# Patient Record
Sex: Female | Born: 1975
Health system: Southern US, Community
[De-identification: ages and names within clinical notes are randomized; demographics above are authoritative.]

## PROBLEM LIST (undated history)

## (undated) DIAGNOSIS — E669 Obesity, unspecified: Secondary | ICD-10-CM

## (undated) DIAGNOSIS — G5712 Meralgia paresthetica, left lower limb: Secondary | ICD-10-CM

## (undated) DIAGNOSIS — K219 Gastro-esophageal reflux disease without esophagitis: Secondary | ICD-10-CM

## (undated) DIAGNOSIS — K589 Irritable bowel syndrome without diarrhea: Secondary | ICD-10-CM

## (undated) DIAGNOSIS — M797 Fibromyalgia: Secondary | ICD-10-CM

## (undated) DIAGNOSIS — D6861 Antiphospholipid syndrome: Secondary | ICD-10-CM

## (undated) DIAGNOSIS — E559 Vitamin D deficiency, unspecified: Secondary | ICD-10-CM

## (undated) DIAGNOSIS — R011 Cardiac murmur, unspecified: Secondary | ICD-10-CM

## (undated) DIAGNOSIS — F329 Major depressive disorder, single episode, unspecified: Secondary | ICD-10-CM

## (undated) DIAGNOSIS — M1990 Unspecified osteoarthritis, unspecified site: Secondary | ICD-10-CM

## (undated) DIAGNOSIS — I10 Essential (primary) hypertension: Secondary | ICD-10-CM

## (undated) DIAGNOSIS — F419 Anxiety disorder, unspecified: Secondary | ICD-10-CM

## (undated) DIAGNOSIS — R5383 Other fatigue: Secondary | ICD-10-CM

## (undated) DIAGNOSIS — R0602 Shortness of breath: Secondary | ICD-10-CM

## (undated) DIAGNOSIS — F32A Depression, unspecified: Secondary | ICD-10-CM

## (undated) DIAGNOSIS — G4730 Sleep apnea, unspecified: Secondary | ICD-10-CM

## (undated) DIAGNOSIS — I2699 Other pulmonary embolism without acute cor pulmonale: Secondary | ICD-10-CM

## (undated) DIAGNOSIS — N809 Endometriosis, unspecified: Secondary | ICD-10-CM

## (undated) HISTORY — DX: Fibromyalgia: M79.7

## (undated) HISTORY — DX: Gastro-esophageal reflux disease without esophagitis: K21.9

## (undated) HISTORY — DX: Major depressive disorder, single episode, unspecified: F32.9

## (undated) HISTORY — DX: Meralgia paresthetica, left lower limb: G57.12

## (undated) HISTORY — DX: Depression, unspecified: F32.A

## (undated) HISTORY — DX: Unspecified osteoarthritis, unspecified site: M19.90

## (undated) HISTORY — DX: Essential (primary) hypertension: I10

## (undated) HISTORY — DX: Vitamin D deficiency, unspecified: E55.9

## (undated) HISTORY — DX: Endometriosis, unspecified: N80.9

## (undated) HISTORY — DX: Cardiac murmur, unspecified: R01.1

## (undated) HISTORY — DX: Other fatigue: R53.83

## (undated) HISTORY — DX: Sleep apnea, unspecified: G47.30

## (undated) HISTORY — DX: Antiphospholipid syndrome: D68.61

## (undated) HISTORY — DX: Shortness of breath: R06.02

## (undated) HISTORY — DX: Anxiety disorder, unspecified: F41.9

## (undated) HISTORY — DX: Obesity, unspecified: E66.9

## (undated) HISTORY — DX: Other pulmonary embolism without acute cor pulmonale: I26.99

## (undated) HISTORY — PX: OTHER SURGICAL HISTORY: SHX169

## (undated) HISTORY — DX: Irritable bowel syndrome, unspecified: K58.9

---

## 1998-07-30 ENCOUNTER — Other Ambulatory Visit: Admission: RE | Admit: 1998-07-30 | Discharge: 1998-07-30 | Payer: Self-pay | Admitting: *Deleted

## 1999-10-27 ENCOUNTER — Emergency Department (HOSPITAL_COMMUNITY): Admission: EM | Admit: 1999-10-27 | Discharge: 1999-10-27 | Payer: Self-pay | Admitting: Emergency Medicine

## 1999-10-27 ENCOUNTER — Encounter: Payer: Self-pay | Admitting: Emergency Medicine

## 1999-11-12 ENCOUNTER — Other Ambulatory Visit: Admission: RE | Admit: 1999-11-12 | Discharge: 1999-11-12 | Payer: Self-pay | Admitting: *Deleted

## 1999-12-11 ENCOUNTER — Emergency Department (HOSPITAL_COMMUNITY): Admission: EM | Admit: 1999-12-11 | Discharge: 1999-12-11 | Payer: Self-pay | Admitting: Emergency Medicine

## 1999-12-12 ENCOUNTER — Encounter: Payer: Self-pay | Admitting: Emergency Medicine

## 2001-06-14 ENCOUNTER — Ambulatory Visit (HOSPITAL_COMMUNITY): Admission: RE | Admit: 2001-06-14 | Discharge: 2001-06-14 | Payer: Self-pay | Admitting: *Deleted

## 2001-06-14 ENCOUNTER — Encounter (INDEPENDENT_AMBULATORY_CARE_PROVIDER_SITE_OTHER): Payer: Self-pay | Admitting: *Deleted

## 2002-01-02 ENCOUNTER — Other Ambulatory Visit: Admission: RE | Admit: 2002-01-02 | Discharge: 2002-01-02 | Payer: Self-pay | Admitting: Obstetrics and Gynecology

## 2002-03-30 DIAGNOSIS — I2699 Other pulmonary embolism without acute cor pulmonale: Secondary | ICD-10-CM

## 2002-03-30 HISTORY — DX: Other pulmonary embolism without acute cor pulmonale: I26.99

## 2002-05-08 ENCOUNTER — Ambulatory Visit (HOSPITAL_COMMUNITY): Admission: RE | Admit: 2002-05-08 | Discharge: 2002-05-08 | Payer: Self-pay | Admitting: Obstetrics & Gynecology

## 2002-05-08 ENCOUNTER — Encounter (INDEPENDENT_AMBULATORY_CARE_PROVIDER_SITE_OTHER): Payer: Self-pay | Admitting: Specialist

## 2002-08-23 ENCOUNTER — Other Ambulatory Visit: Admission: RE | Admit: 2002-08-23 | Discharge: 2002-08-23 | Payer: Self-pay | Admitting: Obstetrics & Gynecology

## 2002-12-15 ENCOUNTER — Ambulatory Visit (HOSPITAL_COMMUNITY): Admission: RE | Admit: 2002-12-15 | Discharge: 2002-12-15 | Payer: Self-pay | Admitting: Obstetrics & Gynecology

## 2003-03-15 ENCOUNTER — Inpatient Hospital Stay (HOSPITAL_COMMUNITY): Admission: AD | Admit: 2003-03-15 | Discharge: 2003-03-18 | Payer: Self-pay | Admitting: Obstetrics & Gynecology

## 2003-03-20 ENCOUNTER — Encounter: Admission: RE | Admit: 2003-03-20 | Discharge: 2003-04-19 | Payer: Self-pay | Admitting: Obstetrics & Gynecology

## 2003-04-20 ENCOUNTER — Encounter: Admission: RE | Admit: 2003-04-20 | Discharge: 2003-05-20 | Payer: Self-pay | Admitting: Obstetrics & Gynecology

## 2003-04-25 ENCOUNTER — Other Ambulatory Visit: Admission: RE | Admit: 2003-04-25 | Discharge: 2003-04-25 | Payer: Self-pay | Admitting: Obstetrics & Gynecology

## 2004-03-09 ENCOUNTER — Ambulatory Visit: Payer: Self-pay | Admitting: Internal Medicine

## 2004-03-09 ENCOUNTER — Inpatient Hospital Stay (HOSPITAL_COMMUNITY): Admission: EM | Admit: 2004-03-09 | Discharge: 2004-03-12 | Payer: Self-pay | Admitting: Family Medicine

## 2004-03-09 IMAGING — CR DG CHEST 2V
2 series · 2 of 2 positions shown · non-contrast
Comparison: None.

CLINICAL DATA: Right-sided chest pain. Shortness of breath.

CHEST - 2 VIEW  [DATE]:

[view not recorded (1 of 2)]
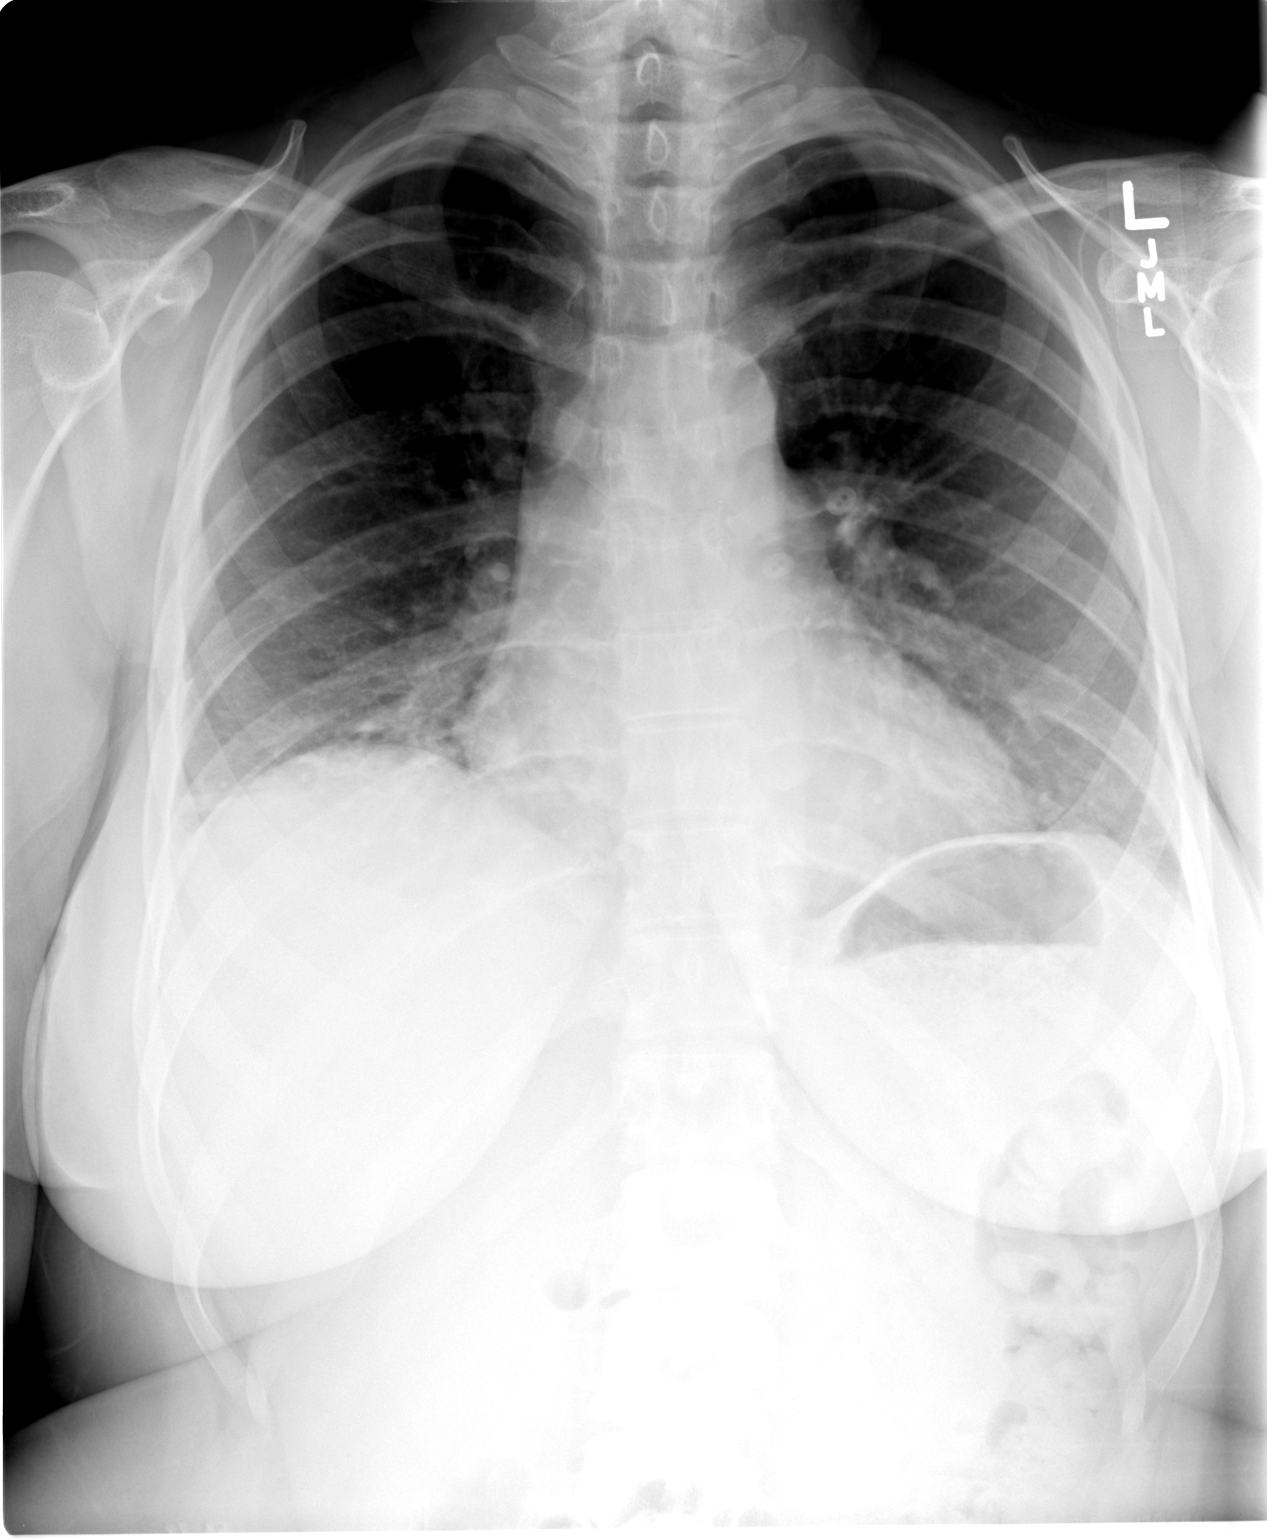

[view not recorded (2 of 2)]
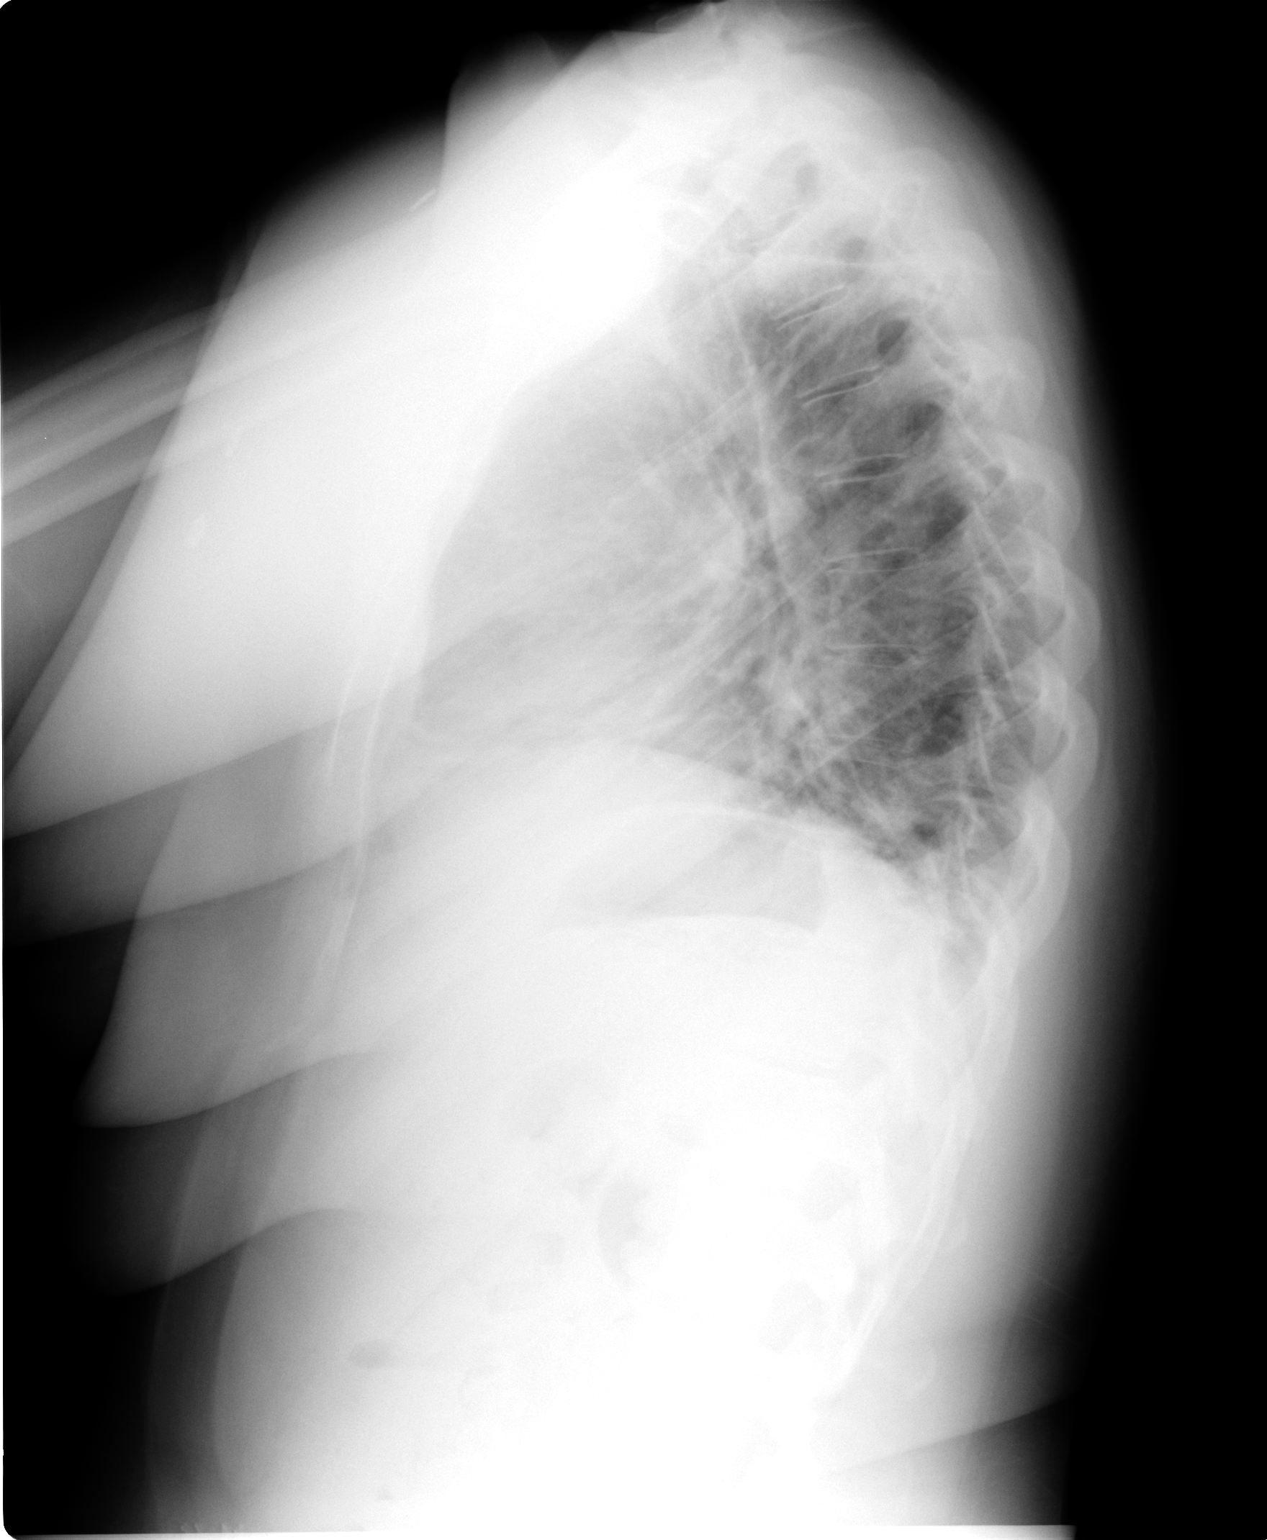

[2 of 2 positions shown; findings below may reference images not displayed]

FINDINGS: The cardiomediastinal silhouette is unremarkable. There is patchy
airspace disease in the right lower lobe. The lungs appear clear otherwise. The
visualized bony thorax appears intact.
IMPRESSION: Right lower lobe pneumonia.

## 2004-03-09 IMAGING — CT CT ANGIO CHEST
1 of 5 series · 14 of 29 positions shown · IV contrast (120 ML OMNI 300)
Comparison: none

CLINICAL DATA: Right-sided chest pain.  Short of breath.  Evaluate for pulmonary embolism.  
 CHEST CT ANGIO WITH CONTRAST:

[Series 4: recon 2: pe · axial · 0.65mm/px · z∈[-312,-58]mm · 14 of 469 slices shown]
[im 32/469  lung]
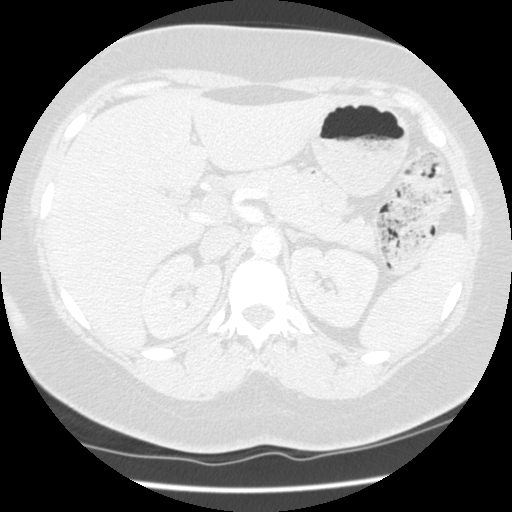
[im 63/469  mediastinal]
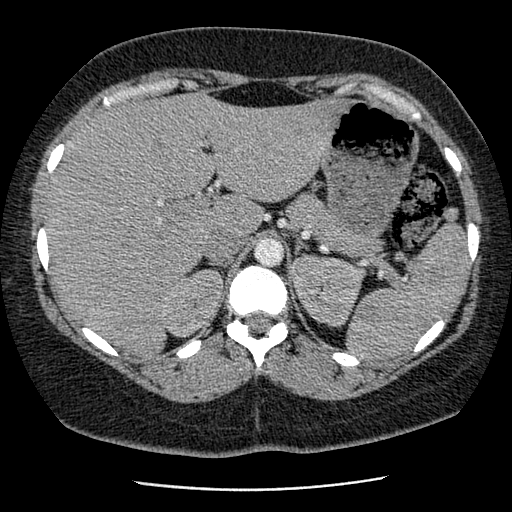
[im 94/469  lung]
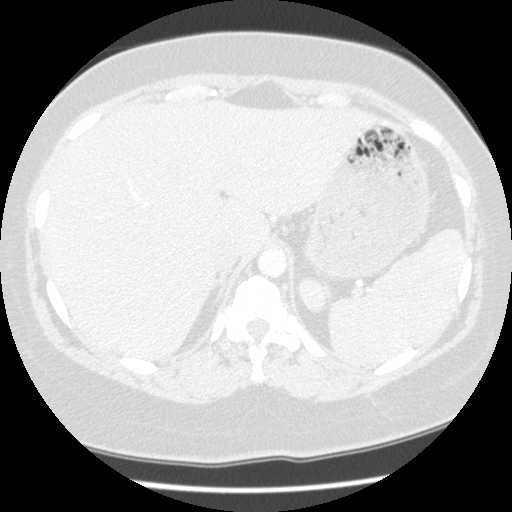
[im 125/469  mediastinal]
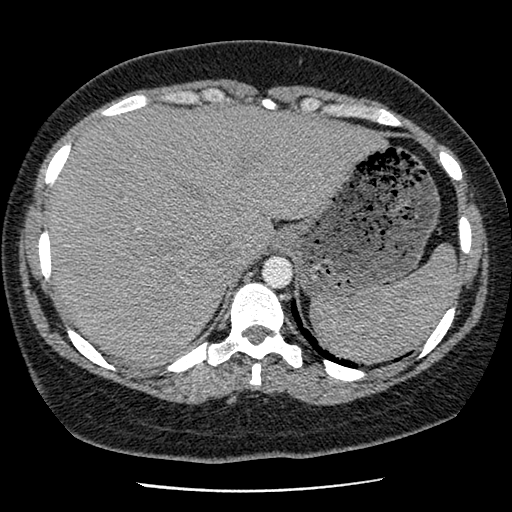
[im 157/469  lung]
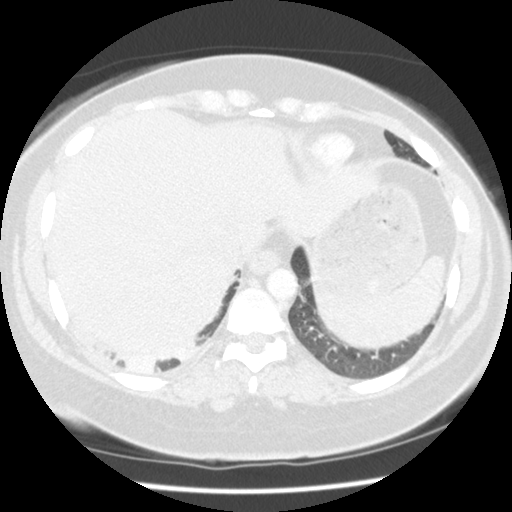
[im 188/469  mediastinal]
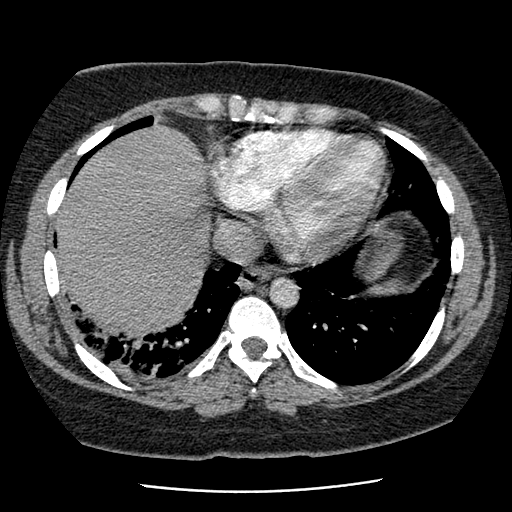
[im 219/469  lung]
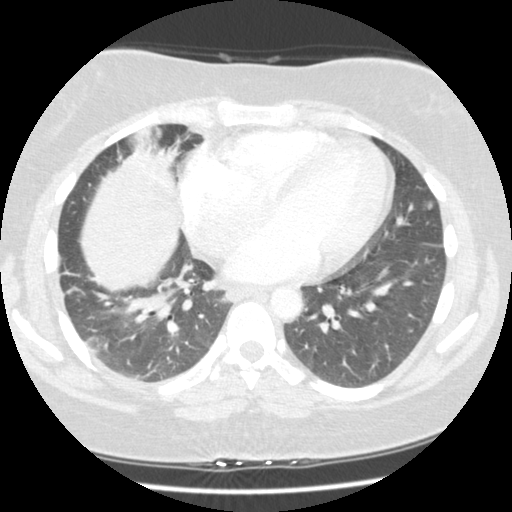
[im 250/469  mediastinal]
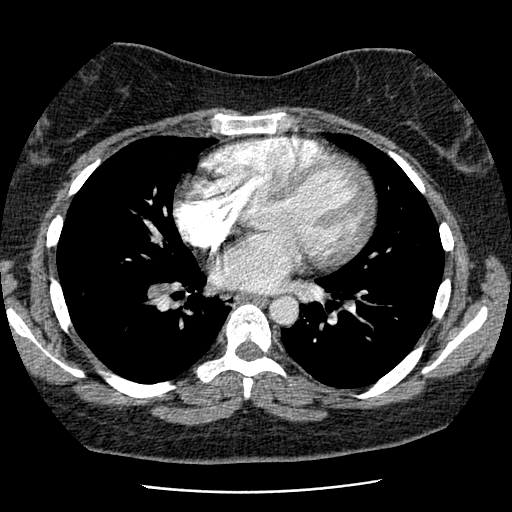
[im 281/469  lung]
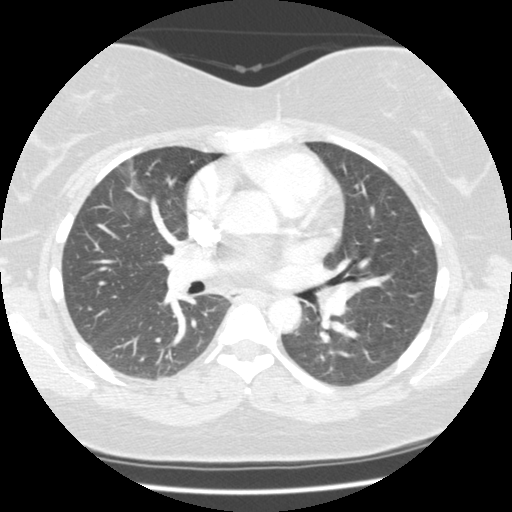
[im 313/469  mediastinal]
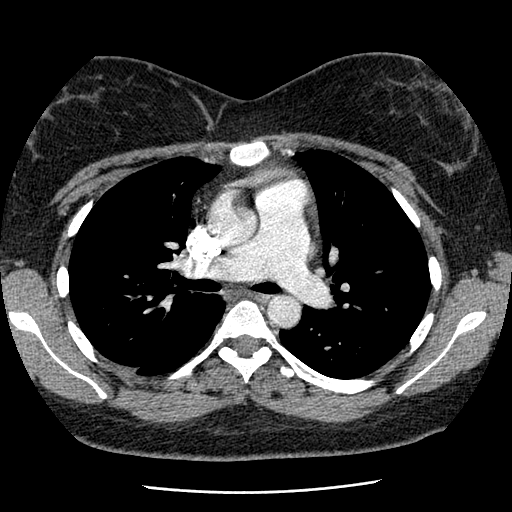
[im 344/469  lung]
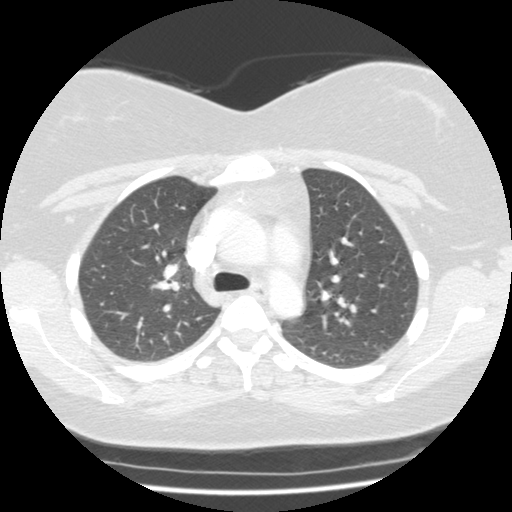
[im 375/469  mediastinal]
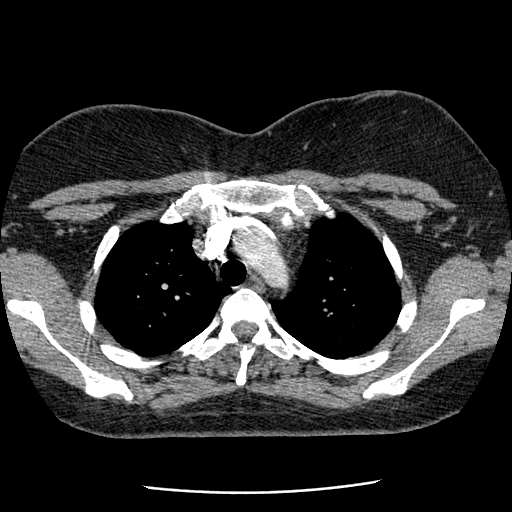
[im 406/469  lung]
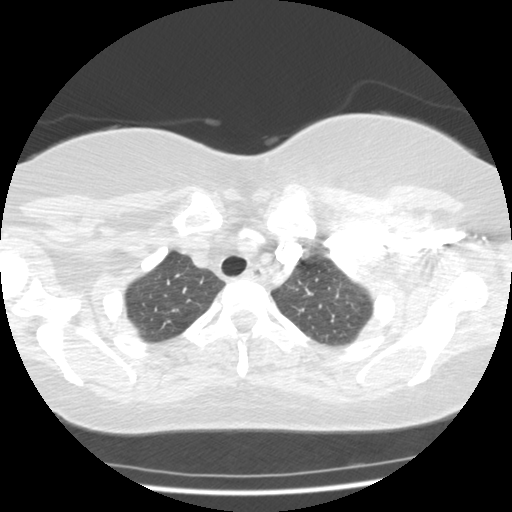
[im 437/469  mediastinal]
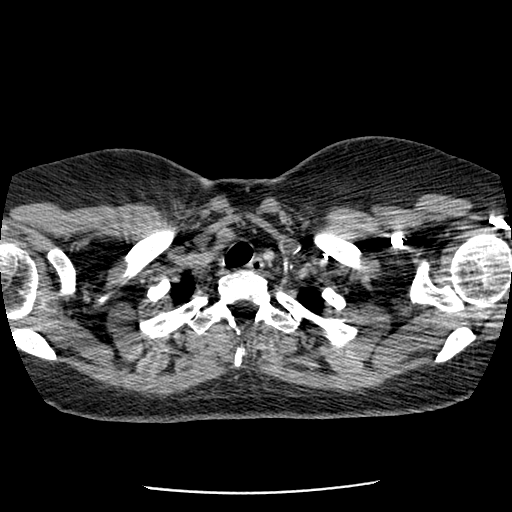

[14 of 29 positions shown; findings below may reference images not displayed]

Multidetector CT imaging of the chest was performed according to the protocol for detection of pulmonary embolism during IV bolus injection of 120 cc Omnipaque 300.  Coronal and sagittal plane reformatted images were also generated.  
 There are bilateral pulmonary emboli involving the right upper lobe, right lower lobe and left lower lobe.  There is bibasilar atelectasis and right middle lobe atelectasis.  
 There is a 5 mm nodule in the right middle lobe which is noncalcified.  There is a 3 mm right upper lobe nodule on image 41 and there is a 6 mm noncalcified nodule in the lingula on image 64.  There is no effusion.
IMPRESSION: 1. Positive for bilateral pulmonary emboli. 
 2. Bilateral atelectasis.  
 3. Bilateral noncalcified pulmonary nodules which are nonspecific.  Given the patient?s age, these are likely benign however, it could be due to metastatic disease.  Clinical correlation is suggested if there is no other history of malignancy, a followup CT is suggested in three months to assure stability.

## 2004-03-14 ENCOUNTER — Ambulatory Visit: Payer: Self-pay | Admitting: Internal Medicine

## 2004-03-20 ENCOUNTER — Ambulatory Visit: Payer: Self-pay | Admitting: Internal Medicine

## 2004-03-21 ENCOUNTER — Emergency Department (HOSPITAL_COMMUNITY): Admission: EM | Admit: 2004-03-21 | Discharge: 2004-03-22 | Payer: Self-pay | Admitting: Emergency Medicine

## 2004-03-22 IMAGING — CT CT ANGIO CHEST
3 of 6 series · 17 of 30 positions shown · IV contrast (120 ml [ID])
Comparison: [DATE].

CLINICAL DATA: Pulmonary embolus with recurrent right chest pain.  

CHEST CT ANGIO WITH CONTRAST:
Multidetector CT imaging of the chest was performed according to the protocol for detection of pulmonary embolism during IV bolus injection of 120 cc Omnipaque 300.  Coronal and sagittal plane reformatted images were also generated.

[Series 3: angio chest · axial · 0.70mm/px · z∈[-257,-45]mm · 11 of 290 slices shown]
[im 27/290  lung]
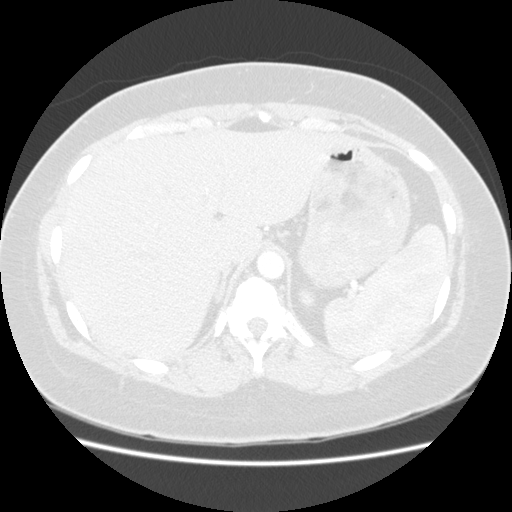
[im 53/290  mediastinal]
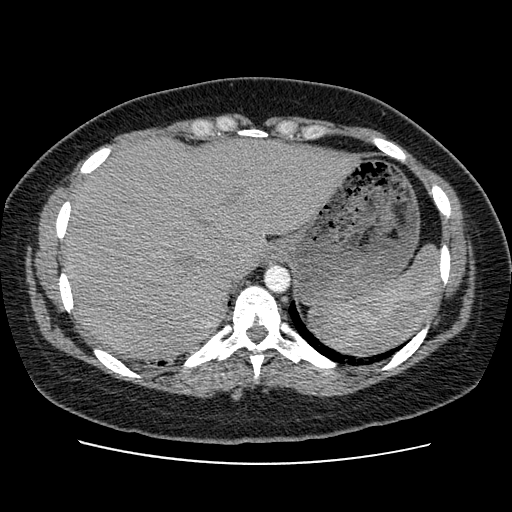
[im 79/290  lung]
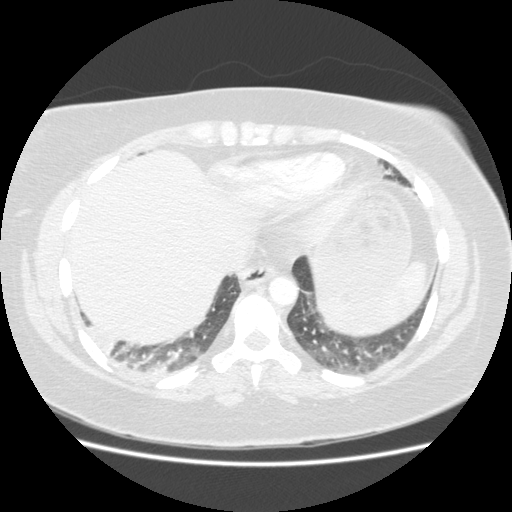
[im 106/290  mediastinal]
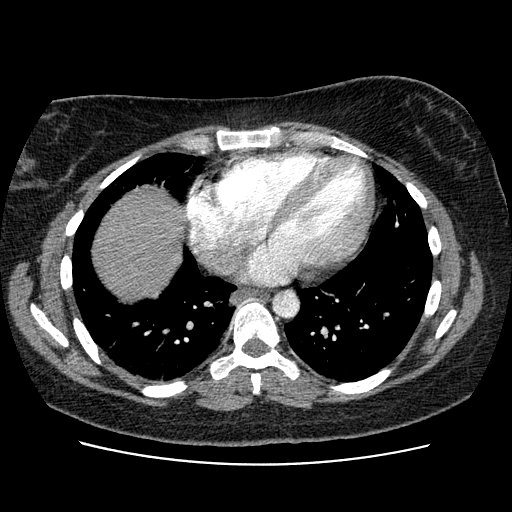
[im 132/290  lung]
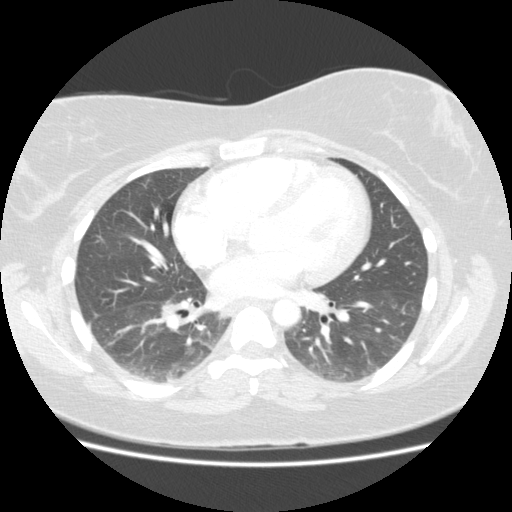
[im 158/290  mediastinal]
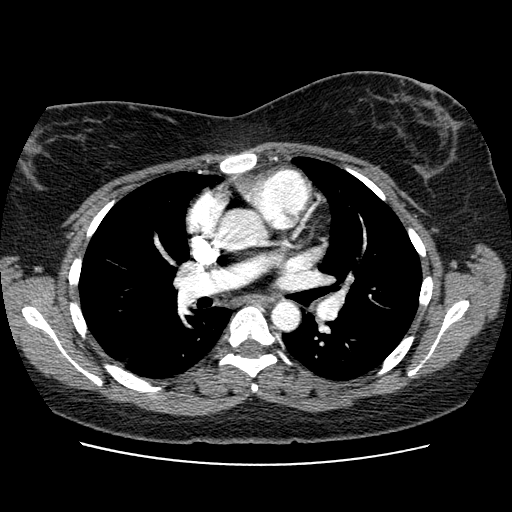
[im 162/290  lung]
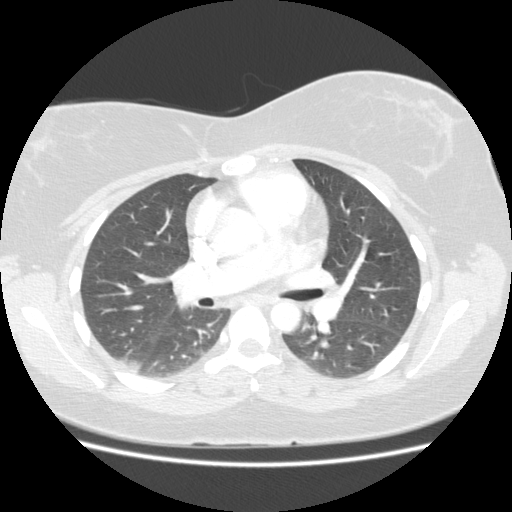
[im 184/290  mediastinal]
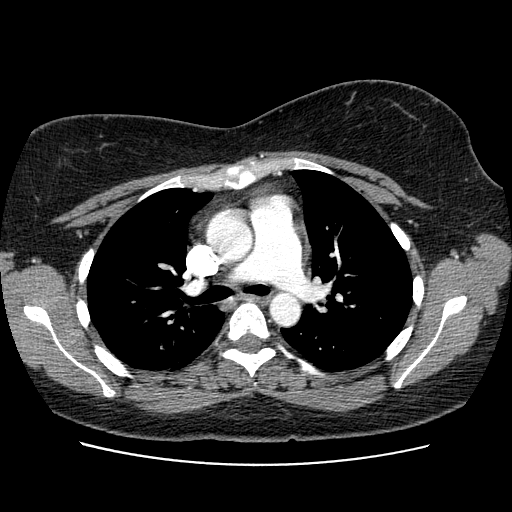
[im 211/290  lung]
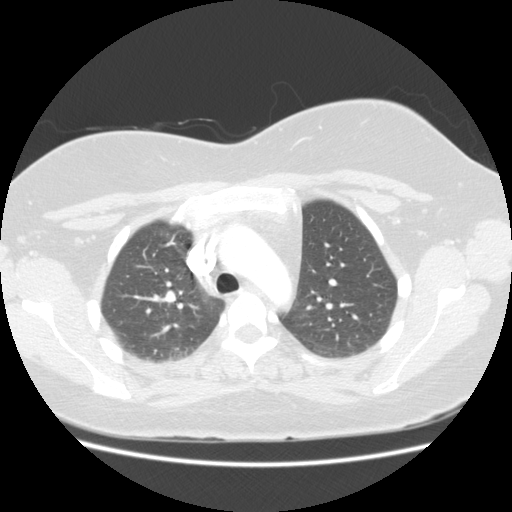
[im 237/290  mediastinal]
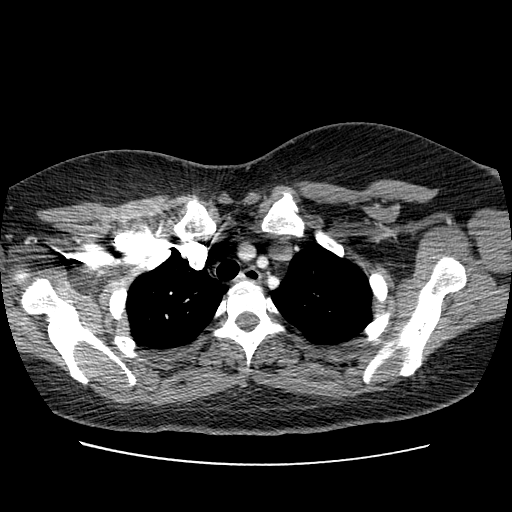
[im 263/290  lung]
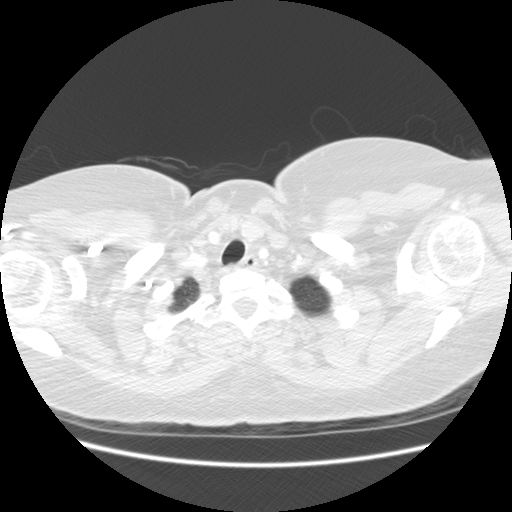

[Series 300: reformatted · sagittal · 0.70mm/px · 4 of 146 slices shown (1 of 2)]
[im 30/146  lung]
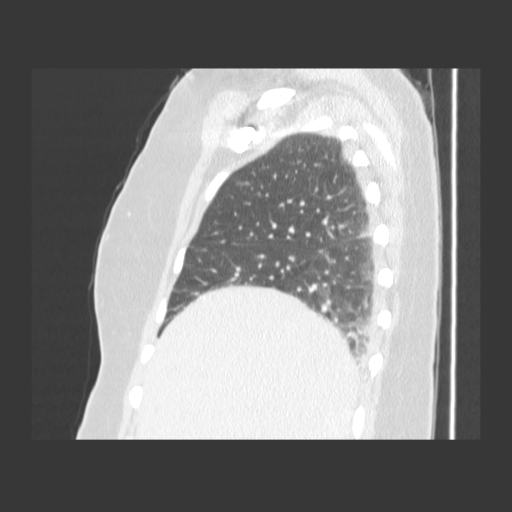
[im 59/146  lung]
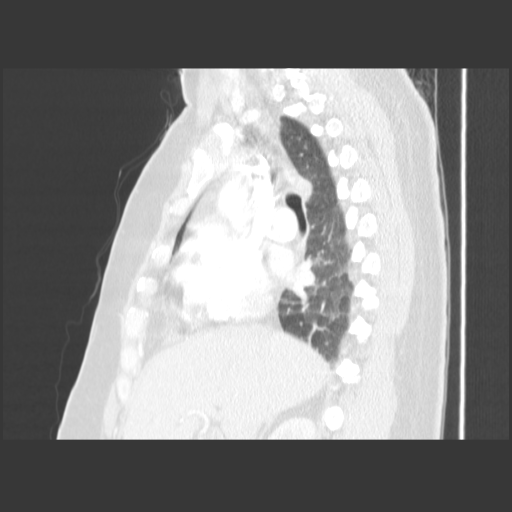
[im 88/146  lung]
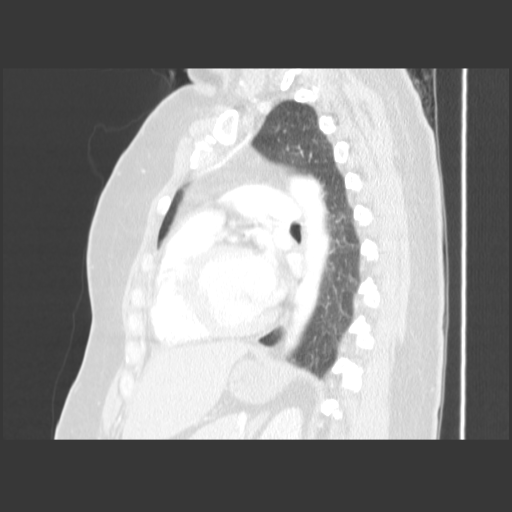
[im 117/146  lung]
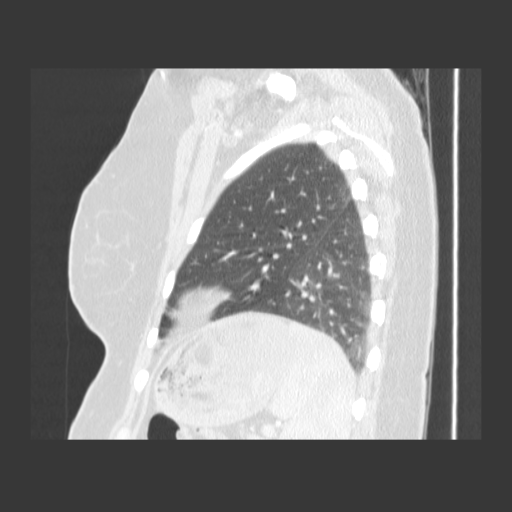

[Series 301: reformatted · coronal · 0.70mm/px · 2 of 99 slices shown (2 of 2)]
[im 33/99  lung]
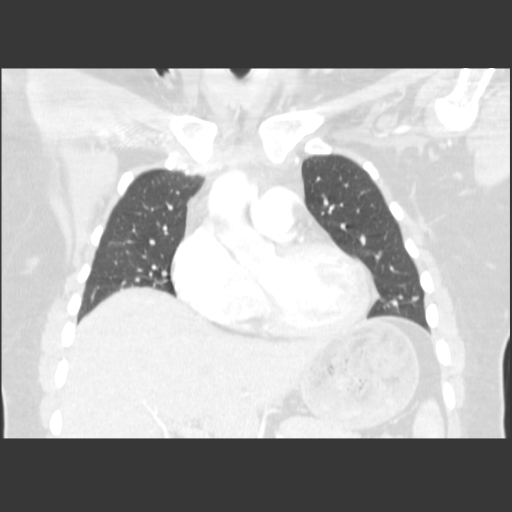
[im 66/99  lung]
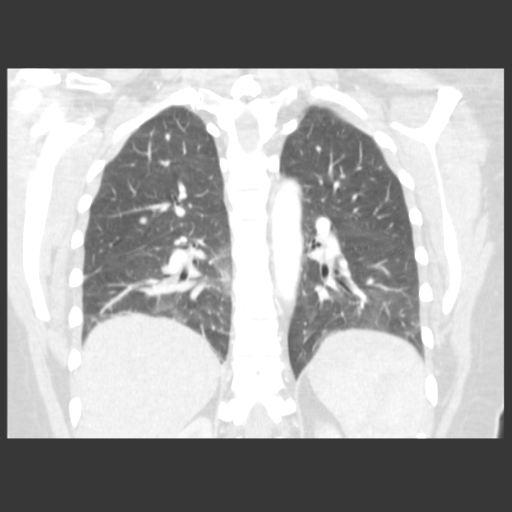

[17 of 30 positions shown; findings below may reference images not displayed]

Since the prior examination, there has been interval partial clearing of bilateral pulmonary emboli without new pulmonary emboli detected.  Shifting of atelectatic changes most notable along the right lung base.  Bilateral pulmonary nodules will require follow-up with unenhanced CT scan in three months time.  Ascending aorta remains enlarged measuring 3.6 cm in maximum dimension.
IMPRESSION: 1.  Slight interval improvement in appearance of bilateral pulmonary emboli without central extension of emboli or new pulmonary emboli detected.  
2.  Shifting atelectatic changes right base. 
3.  Bilateral pulmonary nodules.  These are indeterminate with respect to etiology and as malignancy cannot be excluded these will need to be followed with serial chest CT(s) with the next follow-up unenhanced CT scanning of the chest in [DATE] (or sooner if clinically indicated).
4.  Slightly dilated ascending aorta measuring up to 3.6 cm in maximum transverse dimension.

## 2004-03-25 ENCOUNTER — Ambulatory Visit: Payer: Self-pay | Admitting: Internal Medicine

## 2004-04-01 ENCOUNTER — Ambulatory Visit: Payer: Self-pay | Admitting: Internal Medicine

## 2004-04-08 ENCOUNTER — Ambulatory Visit: Payer: Self-pay | Admitting: Internal Medicine

## 2004-04-15 ENCOUNTER — Ambulatory Visit: Payer: Self-pay | Admitting: Internal Medicine

## 2004-04-28 ENCOUNTER — Ambulatory Visit: Payer: Self-pay | Admitting: Internal Medicine

## 2004-05-19 ENCOUNTER — Ambulatory Visit: Payer: Self-pay | Admitting: Internal Medicine

## 2004-06-02 ENCOUNTER — Ambulatory Visit: Payer: Self-pay | Admitting: Internal Medicine

## 2004-06-19 ENCOUNTER — Ambulatory Visit: Payer: Self-pay | Admitting: Internal Medicine

## 2004-07-14 ENCOUNTER — Ambulatory Visit: Payer: Self-pay | Admitting: Internal Medicine

## 2004-08-18 ENCOUNTER — Ambulatory Visit: Payer: Self-pay | Admitting: Internal Medicine

## 2004-09-08 ENCOUNTER — Emergency Department (HOSPITAL_COMMUNITY): Admission: EM | Admit: 2004-09-08 | Discharge: 2004-09-08 | Payer: Self-pay | Admitting: Emergency Medicine

## 2004-09-13 ENCOUNTER — Emergency Department (HOSPITAL_COMMUNITY): Admission: EM | Admit: 2004-09-13 | Discharge: 2004-09-13 | Payer: Self-pay | Admitting: Family Medicine

## 2004-09-16 ENCOUNTER — Ambulatory Visit: Payer: Self-pay | Admitting: Internal Medicine

## 2004-11-03 ENCOUNTER — Ambulatory Visit: Payer: Self-pay | Admitting: Internal Medicine

## 2004-12-08 ENCOUNTER — Ambulatory Visit: Payer: Self-pay | Admitting: Internal Medicine

## 2005-01-19 ENCOUNTER — Ambulatory Visit: Payer: Self-pay | Admitting: Internal Medicine

## 2005-03-12 ENCOUNTER — Ambulatory Visit: Payer: Self-pay | Admitting: Internal Medicine

## 2005-05-13 ENCOUNTER — Ambulatory Visit: Payer: Self-pay | Admitting: Internal Medicine

## 2005-06-08 ENCOUNTER — Ambulatory Visit: Payer: Self-pay | Admitting: Internal Medicine

## 2005-07-13 ENCOUNTER — Ambulatory Visit: Payer: Self-pay | Admitting: Internal Medicine

## 2005-08-17 ENCOUNTER — Ambulatory Visit: Payer: Self-pay | Admitting: Internal Medicine

## 2005-09-21 ENCOUNTER — Ambulatory Visit: Payer: Self-pay | Admitting: Internal Medicine

## 2005-09-28 ENCOUNTER — Ambulatory Visit: Payer: Self-pay | Admitting: Internal Medicine

## 2005-12-14 ENCOUNTER — Ambulatory Visit: Payer: Self-pay | Admitting: Hospitalist

## 2006-01-11 ENCOUNTER — Ambulatory Visit: Payer: Self-pay | Admitting: Internal Medicine

## 2006-01-12 DIAGNOSIS — Z86718 Personal history of other venous thrombosis and embolism: Secondary | ICD-10-CM | POA: Insufficient documentation

## 2006-04-09 DIAGNOSIS — K219 Gastro-esophageal reflux disease without esophagitis: Secondary | ICD-10-CM | POA: Insufficient documentation

## 2006-04-09 DIAGNOSIS — F411 Generalized anxiety disorder: Secondary | ICD-10-CM | POA: Insufficient documentation

## 2006-05-17 ENCOUNTER — Ambulatory Visit: Payer: Self-pay | Admitting: Hospitalist

## 2006-05-17 LAB — CONVERTED CEMR LAB

## 2006-06-14 ENCOUNTER — Encounter (INDEPENDENT_AMBULATORY_CARE_PROVIDER_SITE_OTHER): Payer: Self-pay | Admitting: Pharmacist

## 2006-06-14 ENCOUNTER — Ambulatory Visit: Payer: Self-pay | Admitting: *Deleted

## 2006-06-14 LAB — CONVERTED CEMR LAB: INR: 3

## 2006-09-20 ENCOUNTER — Ambulatory Visit: Payer: Self-pay | Admitting: Internal Medicine

## 2006-09-20 LAB — CONVERTED CEMR LAB: INR: 2.2

## 2006-10-25 ENCOUNTER — Ambulatory Visit: Payer: Self-pay | Admitting: Hospitalist

## 2006-10-25 LAB — CONVERTED CEMR LAB: INR: 2.7

## 2006-12-06 ENCOUNTER — Ambulatory Visit: Payer: Self-pay | Admitting: Internal Medicine

## 2006-12-06 LAB — CONVERTED CEMR LAB

## 2007-01-17 ENCOUNTER — Ambulatory Visit: Payer: Self-pay | Admitting: Infectious Diseases

## 2007-01-17 LAB — CONVERTED CEMR LAB: INR: 2.6

## 2007-08-18 ENCOUNTER — Ambulatory Visit: Payer: Self-pay | Admitting: Internal Medicine

## 2007-11-07 ENCOUNTER — Ambulatory Visit: Payer: Self-pay | Admitting: Internal Medicine

## 2007-11-07 LAB — CONVERTED CEMR LAB: INR: 2.9

## 2008-02-21 ENCOUNTER — Ambulatory Visit: Payer: Self-pay | Admitting: Internal Medicine

## 2008-02-27 ENCOUNTER — Ambulatory Visit: Payer: Self-pay | Admitting: Internal Medicine

## 2008-03-01 ENCOUNTER — Ambulatory Visit: Payer: Self-pay | Admitting: Internal Medicine

## 2008-03-27 ENCOUNTER — Ambulatory Visit: Payer: Self-pay | Admitting: Internal Medicine

## 2008-04-06 ENCOUNTER — Ambulatory Visit: Payer: Self-pay | Admitting: Internal Medicine

## 2008-05-08 ENCOUNTER — Ambulatory Visit: Payer: Self-pay | Admitting: Internal Medicine

## 2008-05-17 ENCOUNTER — Ambulatory Visit: Payer: Self-pay | Admitting: Internal Medicine

## 2008-07-13 ENCOUNTER — Ambulatory Visit: Payer: Self-pay | Admitting: Internal Medicine

## 2008-09-11 ENCOUNTER — Ambulatory Visit: Payer: Self-pay | Admitting: Internal Medicine

## 2008-10-23 ENCOUNTER — Ambulatory Visit: Payer: Self-pay | Admitting: Internal Medicine

## 2008-11-20 ENCOUNTER — Ambulatory Visit: Payer: Self-pay | Admitting: Internal Medicine

## 2008-12-19 ENCOUNTER — Emergency Department (HOSPITAL_COMMUNITY): Admission: EM | Admit: 2008-12-19 | Discharge: 2008-12-20 | Payer: Self-pay | Admitting: Emergency Medicine

## 2008-12-20 IMAGING — CR DG ELBOW COMPLETE 3+V*L*
4 series · 4 of 4 positions shown · non-contrast
Comparison: Forearm films [DATE].

CLINICAL DATA: Status post fall.  Left arm pain.

LEFT ELBOW - COMPLETE 3+ VIEW

[view not recorded (1 of 4)]
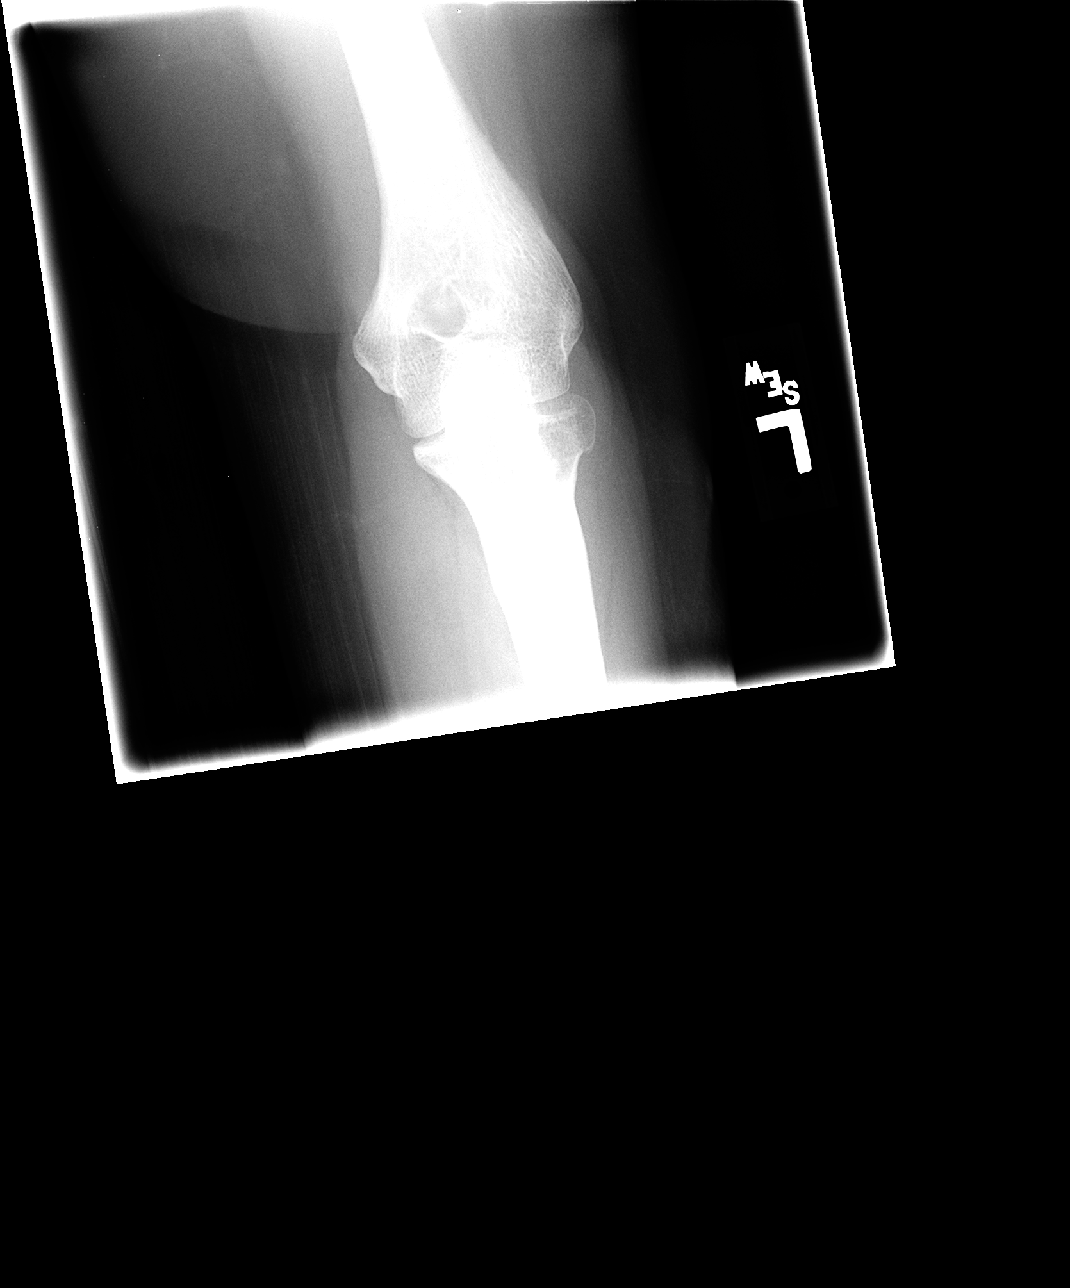

[view not recorded (2 of 4)]
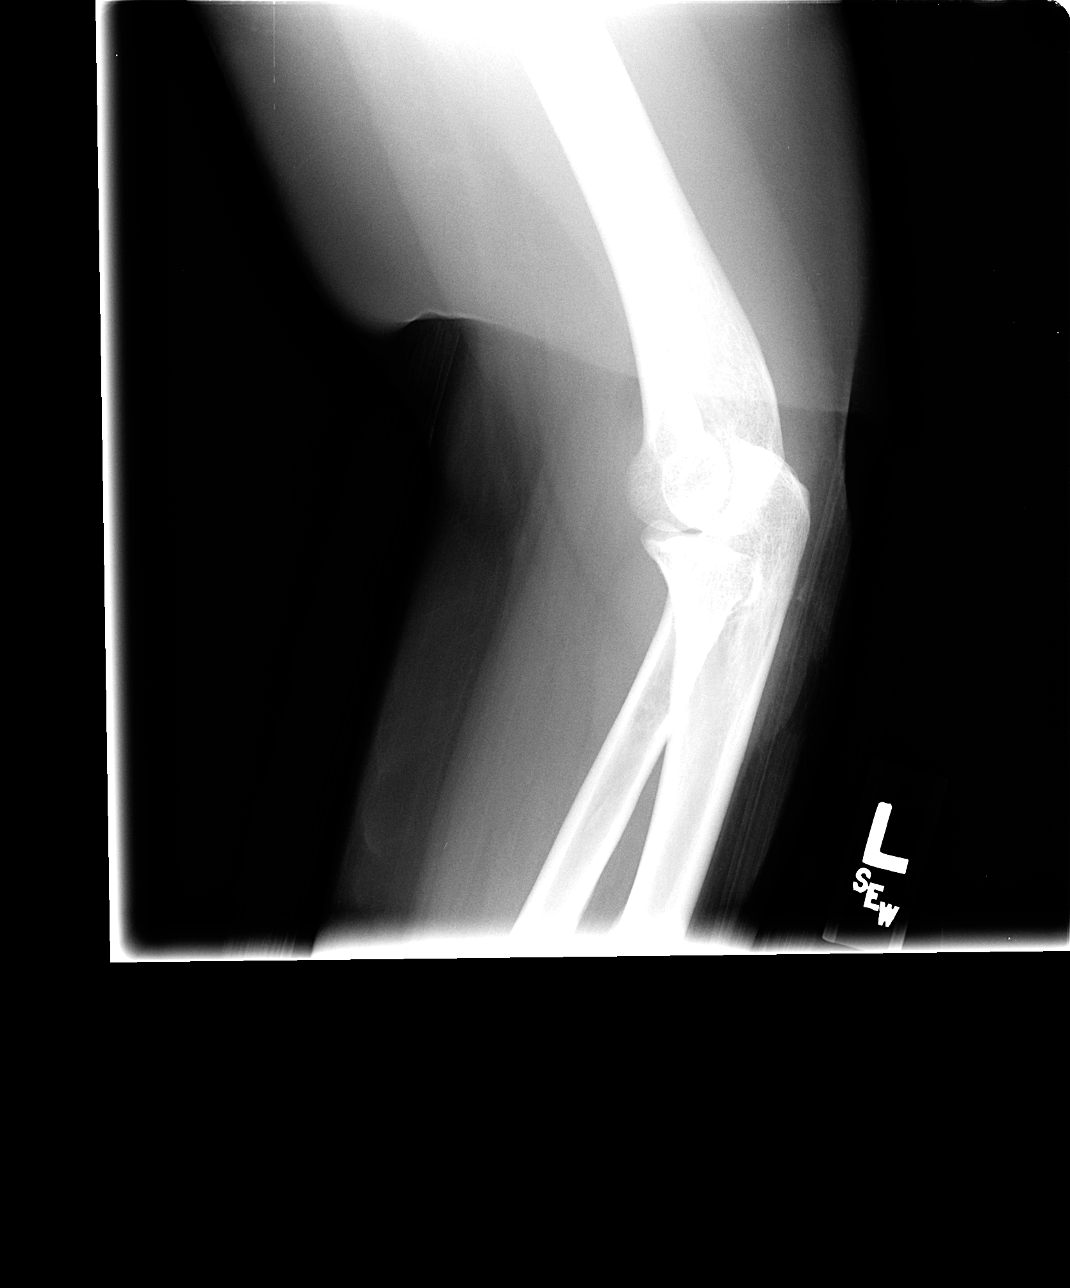

[view not recorded (3 of 4)]
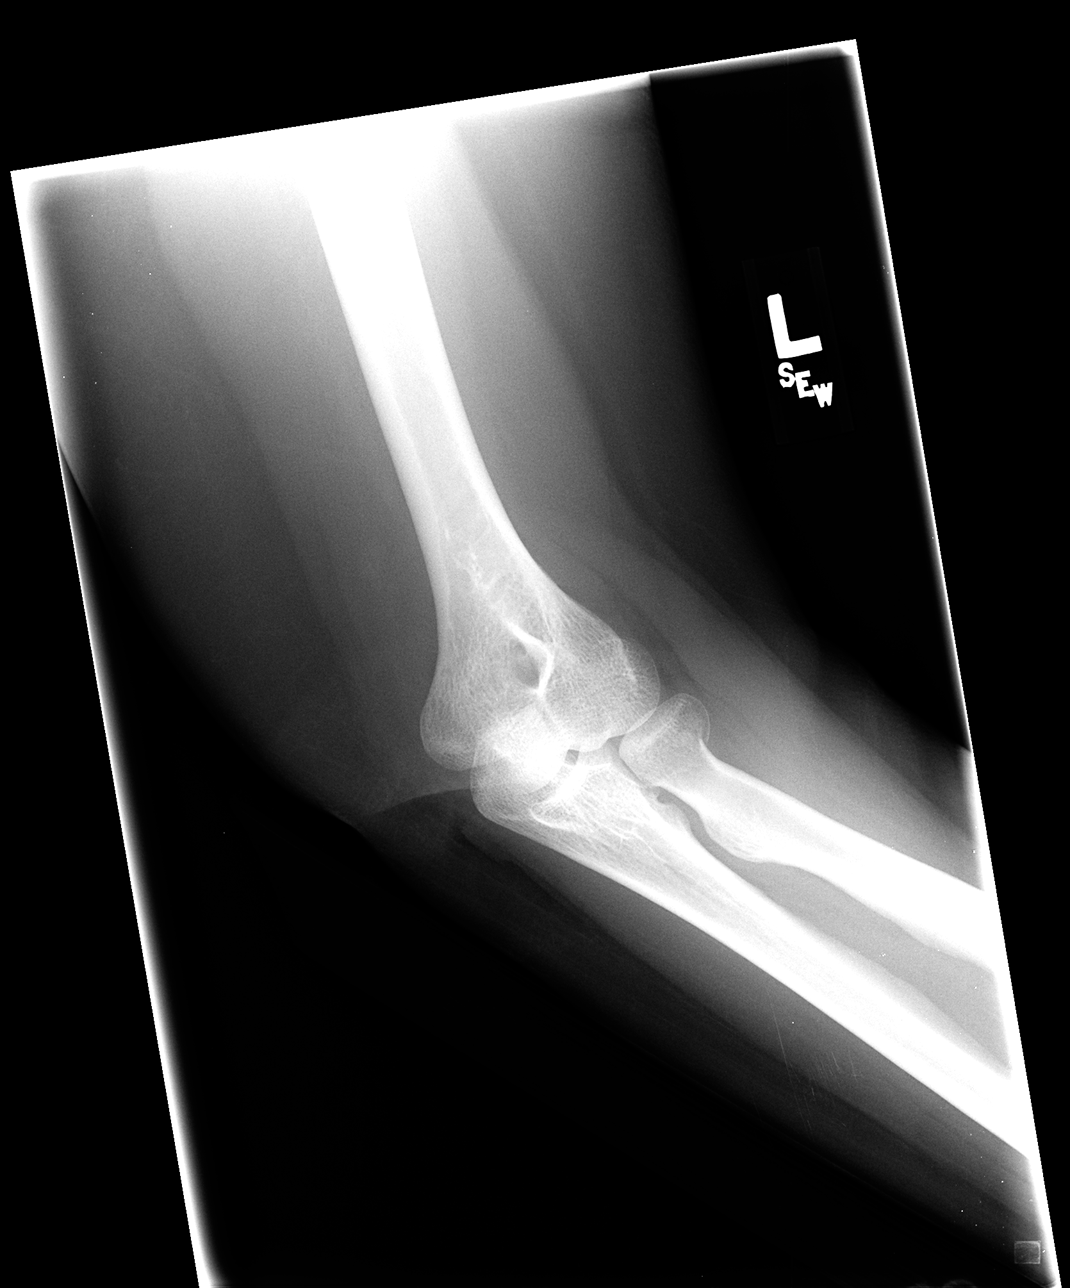

[view not recorded (4 of 4)]
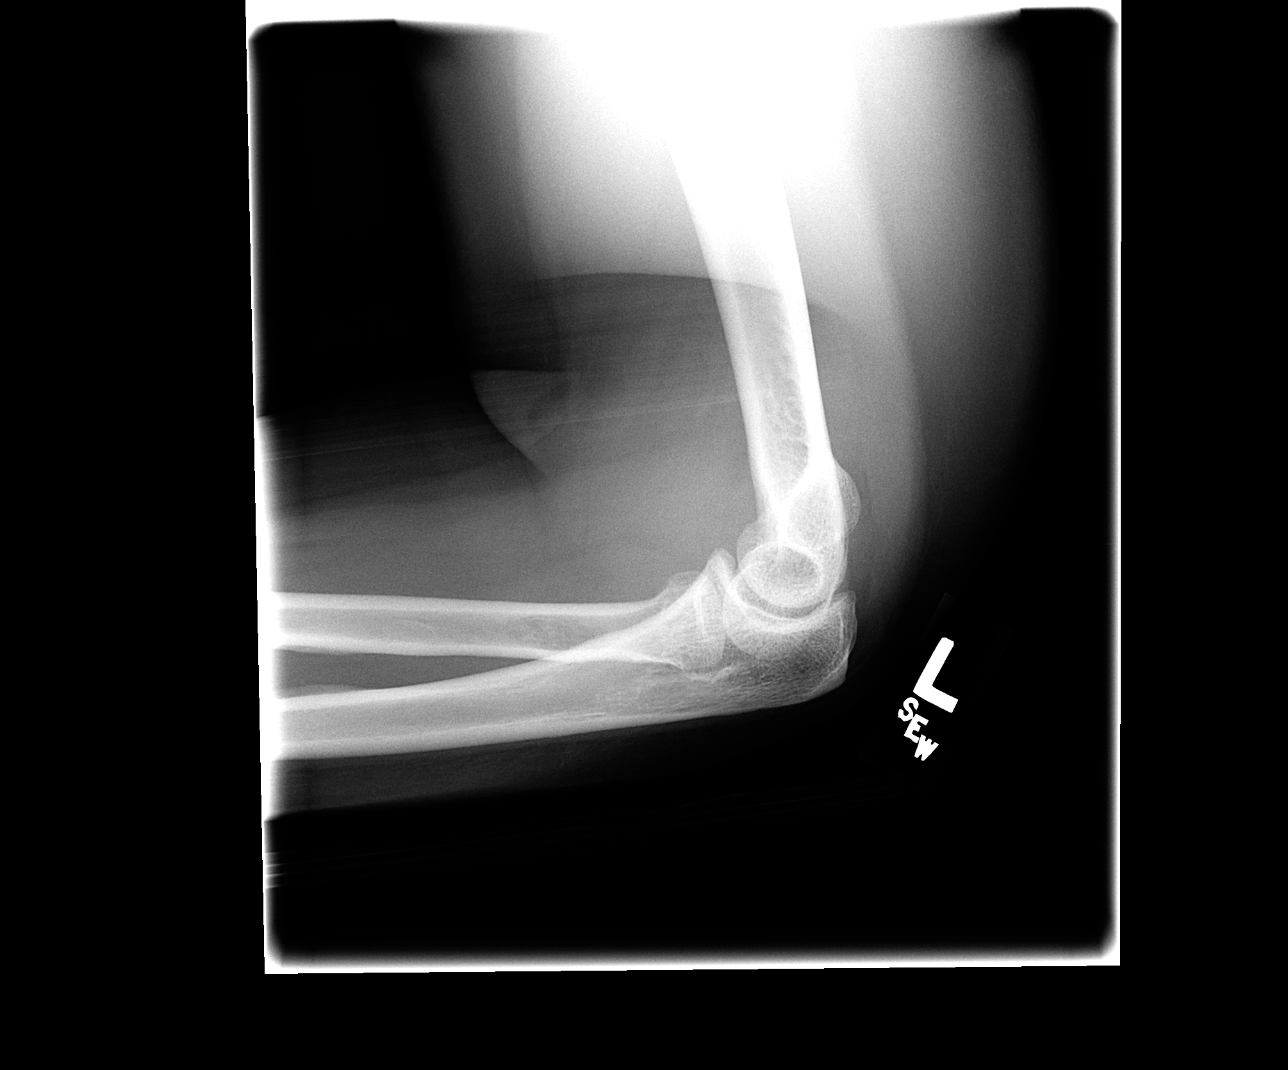

[4 of 4 positions shown; findings below may reference images not displayed]

FINDINGS: The left elbow is located.  There is no significant joint
effusion.  No acute bone or soft tissue abnormality is present.
IMPRESSION: Negative left elbow.

## 2008-12-27 ENCOUNTER — Ambulatory Visit: Payer: Self-pay | Admitting: Internal Medicine

## 2009-01-31 ENCOUNTER — Ambulatory Visit: Payer: Self-pay | Admitting: Internal Medicine

## 2009-05-02 ENCOUNTER — Ambulatory Visit: Payer: Self-pay | Admitting: Internal Medicine

## 2009-08-01 ENCOUNTER — Ambulatory Visit: Payer: Self-pay | Admitting: Internal Medicine

## 2009-08-15 ENCOUNTER — Ambulatory Visit: Payer: Self-pay | Admitting: Internal Medicine

## 2009-09-05 ENCOUNTER — Ambulatory Visit: Payer: Self-pay | Admitting: Internal Medicine

## 2009-10-01 ENCOUNTER — Ambulatory Visit: Payer: Self-pay | Admitting: Internal Medicine

## 2009-10-31 ENCOUNTER — Ambulatory Visit: Payer: Self-pay | Admitting: Internal Medicine

## 2009-11-14 ENCOUNTER — Ambulatory Visit: Payer: Self-pay | Admitting: Internal Medicine

## 2009-12-03 ENCOUNTER — Ambulatory Visit: Payer: Self-pay | Admitting: Internal Medicine

## 2009-12-17 ENCOUNTER — Ambulatory Visit: Payer: Self-pay | Admitting: Internal Medicine

## 2010-01-14 ENCOUNTER — Ambulatory Visit: Payer: Self-pay | Admitting: Internal Medicine

## 2010-01-16 ENCOUNTER — Ambulatory Visit: Payer: Self-pay | Admitting: Internal Medicine

## 2010-02-13 ENCOUNTER — Ambulatory Visit: Payer: Self-pay | Admitting: Internal Medicine

## 2010-02-27 ENCOUNTER — Ambulatory Visit: Payer: Self-pay | Admitting: Internal Medicine

## 2010-03-11 ENCOUNTER — Ambulatory Visit: Payer: Self-pay | Admitting: Internal Medicine

## 2010-03-27 ENCOUNTER — Ambulatory Visit
Admission: RE | Admit: 2010-03-27 | Discharge: 2010-03-27 | Payer: Self-pay | Source: Home / Self Care | Attending: Internal Medicine | Admitting: Internal Medicine

## 2010-04-24 ENCOUNTER — Ambulatory Visit: Admit: 2010-04-24 | Payer: Self-pay | Admitting: Internal Medicine

## 2010-04-25 ENCOUNTER — Ambulatory Visit
Admission: RE | Admit: 2010-04-25 | Discharge: 2010-04-25 | Payer: Self-pay | Source: Home / Self Care | Attending: Internal Medicine | Admitting: Internal Medicine

## 2010-05-22 ENCOUNTER — Other Ambulatory Visit: Payer: BC Managed Care – PPO | Admitting: Internal Medicine

## 2010-05-22 DIAGNOSIS — Z7901 Long term (current) use of anticoagulants: Secondary | ICD-10-CM

## 2010-06-19 ENCOUNTER — Other Ambulatory Visit: Payer: BC Managed Care – PPO | Admitting: Internal Medicine

## 2010-06-26 ENCOUNTER — Other Ambulatory Visit (INDEPENDENT_AMBULATORY_CARE_PROVIDER_SITE_OTHER): Payer: BC Managed Care – PPO | Admitting: Internal Medicine

## 2010-06-26 DIAGNOSIS — Z7901 Long term (current) use of anticoagulants: Secondary | ICD-10-CM

## 2010-07-08 ENCOUNTER — Ambulatory Visit (INDEPENDENT_AMBULATORY_CARE_PROVIDER_SITE_OTHER): Payer: BC Managed Care – PPO | Admitting: Internal Medicine

## 2010-07-08 DIAGNOSIS — L259 Unspecified contact dermatitis, unspecified cause: Secondary | ICD-10-CM

## 2010-08-15 NOTE — Discharge Summary (Signed)
NAME:  Lauren Burns, Lauren Burns             ACCOUNT NO.:  [id number]   MEDICAL RECORD NO.:  [id number]          PATIENT TYPE:  INP   LOCATION:  4714                         FACILITY:  [facility]   PHYSICIAN:  C. Ulyess Mort, M.D.DATE OF BIRTH:  Mar 14, 1976   DATE OF ADMISSION:  03/09/2004  DATE OF DISCHARGE:  03/12/2004                                 DISCHARGE SUMMARY   ADMISSION DIAGNOSES:  1.  Pulmonary embolus.  2.  Pneumonia.   DISCHARGE DIAGNOSES:  1.  Bilateral pulmonary embolus.  2.  Antiphospholipid antibody syndrome.  3.  Right lower lobe pneumonia.  4.  History of gastroesophageal reflux disease.  5.  Anxiety.  6.  Ventricular septal defect requiring prophylaxis.  7.  Endometriosis.   DISCHARGE MEDICATIONS:  1.  Lovenox 120 mg subcutaneously q.12h. x 2 doses.  2.  Coumadin 5 mg p.o. q.d. until Friday March 13, 2004 at which point      she will have an PT/INR drawn and Coumadin dosing will be adjusted      accordingly.  3.  Percocet 5/325 mg 1-2 tablets p.o. q.4h. p.r.n. pain.  4.  Avelox 400 mg p.o. q.d. x 10 days total.   PROCEDURE:  1.  March 09, 2004, chest x-ray which showed a right lower lobe      pneumonia.  2.  March 09, 2004, chest CT angiogram with contrast which showed      positive for bilateral pulmonary emboli, bilateral atelectasis,      bilateral noncalcified pulmonary nodules which are nonspecific but given      the patient's age they are most likely benign; however, clinical      correlation was suggested if there were any history of malignancy.   BRIEF HISTORY:  Lauren Burns is a 35 year old female who developed back pain  on the evening prior to admission and felt she was unable to get comfortable  throughout the night.  The day of admission, the back pain intensified and  she also developed a small amount of right-sided chest pain in addition.  She did have shortness of breath that became progressively more severe.  She  presented to the  Urgent Care on the day of admission when the pain and  shortness of breath became unbearable and was found to have a pulmonary  embolus bilaterally on CAT scan.   The patient does state that last weekend she drove to the beach with her  husband and sat in the car for three to four hours and noticed after the  trip that her legs were hurting her bilaterally, though she did not notice  any obvious swelling.  She also has had bilateral calf pain on and off for  about a year.   OBSTETRICAL HISTORY:  She is gravida 1, para 1 with no history of  miscarriages.  She had recently started on oral contraceptive pills, a  generic Ortho-Tri-Cyclen and had been on them for about two months.  She is  status post the birth of her child last November 2004.  She has been on  birth control pills in the past but has  not been on them for greater than  three years.  The only other medication she takes is Diclofenac b.i.d. for  shoulder pain.   SOCIAL HISTORY:  She does not smoke cigarettes.  She does not use any other  substances.  Only has alcohol very rarely.   FAMILY HISTORY:  Significant for her mother with DVT and antiphospholipid  syndrome.  The DVT occurred after her mother was started on OCP  perimenopausally and she is on Coumadin for life.   PHYSICAL EXAMINATION:  VITAL SIGNS:  Temperature 100.2, pulse 104,  respiratory rate 22, blood pressure 163/67.  She is saturating 98-100% on  two liters.  GENERAL:  She is a young female sitting up in the cart in obvious pain and  tachypneic.  HEENT:  PERRL.  EOMI.  Oropharynx is clear with no exudates.  NECK:  Supple.  No lymphadenopathy.  No thyromegaly, no bruits, and no JVD.  LUNGS:  Diminished throughout with some right lower crackles.  CARDIOVASCULAR:  S1 and S2.  Tachycardic.  ABDOMEN:  Obese, soft, nontender, and nondistended.  Bowel sounds positive.  EXTREMITIES:  Her bilateral lower extremities were 42 cm in circumference.  No obvious edema.   PULSES:  Palpable bilaterally.  GENITOURINARY:  Deferred.  SKIN:  No rash.  MUSCULOSKELETAL:  Moves all extremities.  NEUROLOGICAL:  Alert and oriented x 3.  Cranial nerves II through XII  grossly intact.  PSYCHIATRIC:  She did endorse some anxiety.   LABORATORY DATA:  WBC 13.3, hemoglobin 13.4, hematocrit 39, platelets  288,000.  PT 13.3, INR 1.0, PTT 33.  D-dimer 1.45.  Sodium 133, potassium  4.2, chloride 104, CO2 25, glucose 114, BUN 7, creatinine 0.8, bilirubin  total 0.6, alkaline phosphate 81, AST 18, ALT 23.  Total protein 7.2,  albumin 3.3, calcium 8.7.  Urine pregnancy was negative.   HOSPITAL COURSE:  Problem 1:  BILATERAL PULMONARY EMBOLUS:  We started Mrs.  Burns on Lovenox subcutaneously with a goal of transitioning to Coumadin.  We obtained a hypercoagulable workup which showed homocystine level of 8.36  which is normal.  It showed a positive lupus anticoagulant with passed for  PTT/LA and DRVTT to both be elevated as well as the confirmatory tests for  those two.  The cardiolipin antibodies were actually shown to be negative.  Specific results show cardiolipin antibody IgG was 2.  Cardiolipin IgM was 6  and cardiolipin antibody IgA was 1-again all being consistent with a  negative value.  Anti-thrombin III enzyme came back with a result of 91.  The reference range for normal was 75 to 120, so again, this would be  interpreted as negative.  The factor 5-leiden is still pending.   Throughout the course of hospitalization, her pain improved significantly.  She initially experienced quite severe back and chest pain.  She was given  Acton p.r.n. to keep her saturations greater than 93%.  Her pain was treated  with morphine and Percocet as needed.   By the time of discharge, her INR had actually become therapeutic to PT of  19.2 and INR of 2.0.  So, she was sent home with instructions to take Coumadin 5 mg until Friday, March 13, 2004 at which point she was to  return  to the clinic for a PT/INR and dosage to be further determined.  She  was also to go home with two doses of Lovenox to be taken 12 hours a part.  Given her positive lupus anticoagulant, Lauren Burns was  informed of the need  to be on long anticoagulation. She was informed about the risks of becoming  pregnant while on Coumadin and she was instructed to not ever take oral  contraceptives.   Problem 2:  RIGHT LOWER LOBE PNEUMONIA PER CHEST X-RAY:  Mrs. Pridgeon had a  right lower lobe pneumonia.  She was started on Avelox 400 mg q.d. to cover  community-acquired pneumonia.  She did have a temperature of 100.2 on  admission along with an elevated white count 13.3 and this could be  consistent with her lateral pulmonary embolus.  It was thought the most  prudent action would be to treat her for community-acquired pneumonia so she  was sent home on Avelox 400 mg Burns x 10 more days.   Problem 3:  HISTORY OF GASTROESOPHAGEAL REFLUX DISEASE:  She was started on  Protonix 40 mg q.d.   Problem 4:  HISTORY OF ANXIETY:  Her anxiety significantly improved  throughout the course of her hospitalization stay.  We made a special effort  to educate her and her husband regarding her medical illness as well as the  importance of follow up as well as education on Coumadin and Lovenox usage.  By the time of discharge, the patient was felt significantly more  comfortable with her diagnosis.   FOLLOW UP:  Ms. Sefcik has a follow-up appointment scheduled for Friday,  March 13, 2004 at which point a PT/INR will be drawn and her Coumadin  dosing will be further established.  She also has an appointment scheduled  for Thursday, March 20, 2004 at 3 p.m. for hospital follow-up with Dr.  Hollice Espy and Coumadin Clinic with Dr. Alexandria Lodge.  At that time, she  should also schedule a new patient primary physician appointment with Dr.  Chapman Fitch for January 2006.       IO/MEDQ  D:  03/13/2004  T:   03/13/2004  Job:  [id number]

## 2010-08-15 NOTE — Op Note (Signed)
NAME:  Lauren Burns                       ACCOUNT NO.:  [id number]   MEDICAL RECORD NO.:  [id number]                   PATIENT TYPE:  AMB   LOCATION:  SDC                                  FACILITY:  [facility]   PHYSICIAN:  Genia Del, M.D.             DATE OF BIRTH:  05-13-1975   DATE OF PROCEDURE:  05/08/2002  DATE OF DISCHARGE:                                 OPERATIVE REPORT   PREOPERATIVE DIAGNOSES:  1. Pelvic pain with dysmenorrhea.  2. History of mild endometriosis.  3. Primary infertility.   POSTOPERATIVE DIAGNOSES:  1. Pelvic pain with dysmenorrhea.  2. Moderate pelvic endometriosis.  3. Bilateral endometriomas.  4. Tubo-ovarian adhesions bilaterally.  5. Bilateral tubal patency.  6. Primary infertility.   SURGEON:  Genia Del, M.D.   ANESTHESIOLOGIST:  Quillian Quince, M.D.   INTERVENTION:  1. Laparoscopy.  2. Bilateral ovarian cystectomy for endometrioma.  3. Cauterization of pelvic endometriosis.  4. Lysis of bilateral tubo-ovarian adhesions.  5. Methylene blue hydrotubation.   PROCEDURE:  Under general anesthesia with endotracheal intubation the  patient is in lithotomy position for operative laparoscopy.  She is prepped  with Hibiclens on the abdominal, suprapubic, vulvar, and vaginal areas.  The  bladder is catheterized and Burns vaginal examination is done revealing an  anteverted uterus, no adnexal mass felt.  The speculum is inserted.  The  anterior lip of the cervix is grasped with Burns tenaculum and the Hulka uterine  cannula is inserted.  We remove the speculum.  The patient is draped as  usual abdominally.  An infraumbilical incision is made with the scalpel over  10 mm.  The Veress needle is inserted while raising the abdominal wall.  Security tests are done and Burns pneumoperitoneum is created with 3 L of CO2.  Once achieved the Veress needle is removed.  The laparoscope is inserted.  Burns  contraincision is done suprapubically with Burns scalpel  over 5 mm.  Burns 5 mm  trocar is inserted with Burns probe.  Inspection of the pelvic cavity reveals an  anteverted uterus, normal volume.  Superficial lesions of endometriosis are  present on the left lower uterine aspect as well as on the right mid  anterior uterus.  We see also very superficial lesions of endometriosis on  the left aspect of the bladder.  In the posterior cul-de-sac endometriotic  lesions are visualized at the left and right uterosacral ligaments as well  as inferior to the right uterosacral ligament.  On the adnexa on the right  side the right ovary is adherent to the ovarian fossa.  We have adhesions  between the right tube and the ovary distally.  The tube appears normal  otherwise with normal fimbriae.  Burns cyst is present on the right ovary  measuring about 2 cm.  On the left side the tube appears normal with normal  fimbriae.  Adhesions are present between the left tube  and the sigmoid  colon.  The left ovary is enlarged with Burns cyst measuring 3-4 cm.  Burns third  incision is made at the left iliac area over 5 mm.  Burns 5 mm trocar is  inserted.  We use the bipolar to cauterize all lesions of endometriosis  previously described.  The adhesions at the level of the right ovary are  removed with passage of the probe.  The distal right adhesion between the  tube and ovary is lysed with scissors.  The right tube is freely mobile.  We  then open the right ovary with the unipolar scissors and proceed with  removal of the cyst wall.  The chocolate fluid comes out of the cyst on the  right side compatible with an endometrioma.  The cyst wall is difficult to  remove on that side.  Therefore, the bed of the cyst is coagulated with the  bipolar.  Hemostasis is adequate at that level.  We continue with the left  ovarian cyst.  The ovary is opened the same way with the unipolar scissors.  We then very successfully remove the cyst wall which is thick.  Chocolate  fluid evacuate when the cyst is  opened corresponding to Burns probable  endometrioma.  The left ovarian cyst wall is removed through the 10 mm  trocar.  At that time Burns 5 mm camera is used.  We then complete hemostasis on  the left ovary.  The pelvic cavity is well irrigated and suctioned.  We then  proceed with methylene blue hydrotubation.  The methylene blue passes easily  on the right side with no pathology seen.  On the left side the methylene  blue also passes distally at the fimbriae but Burns small distended pocket  filled with methylene blue distally.  The diameter of this is about 1 cm and  the wall of the tube at the level is very thin.  Pictures are taken before  and at the end of the intervention.  The hemostasis is adequate.  The  estimated blood loss was minimal.  No complication occurred.  The patient  was transferred to recovery room in good status.                                               Genia Del, M.D.    ML/MEDQ  D:  05/08/2002  T:  05/08/2002  Job:  [id number]

## 2010-08-15 NOTE — H&P (Signed)
NAME:  Lauren Burns, Lauren Burns                         ACCOUNT NO.:  [id number]   MEDICAL RECORD NO.:  [id number]                   PATIENT TYPE:   LOCATION:                                       FACILITY:   PHYSICIAN:  Genia Del, M.D.             DATE OF BIRTH:   DATE OF ADMISSION:  03/15/2003  DATE OF DISCHARGE:                                HISTORY & PHYSICAL   HISTORY AND PHYSICAL:  Mrs. Pitt is a 35 year old G1 at 9+ weeks  gestation with an expected date of delivery April 04, 2003.   REASON FOR ADMISSION:  Mild PIH with suspicion of large for gestational age.   HISTORY OF PRESENT ILLNESS:  Fetal movements positive, no regular uterine  contractions, no vaginal bleeding, no fluid leak.  The patient complains of  occasional headaches.  No visual symptoms, no right epigastric pain, mild to  moderate swelling.  Blood pressures at home in the upper 80s to 90s  diastolic.   PAST MEDICAL HISTORY:  1. Cystis.  2. GERD.  3. VSD requiring prophylaxis.  4. Obesity.   PAST SURGICAL HISTORY:  1. Wisdom teeth extraction.  2. Laparoscopy with bilateral ovarian cystectomy for endometrioma,     cauterization of pelvic endometriosis, and lysis of adhesions in February     2004.  3. The patient also had a diagnostic laparoscopy with laser ablation of     endometriosis and lysis of adhesions in August 1997.  4. Rotator cuff syndrome surgery for shoulder.   ALLERGIES:  No known drug allergies.   MEDICATIONS:  Prenate vitamins.   FAMILY HISTORY:  Brother with juvenile diabetes mellitus.   SOCIAL HISTORY:  Married, nonsmoker.   HISTORY OF PRESENT PREGNANCY:  First trimester was normal with mild to  moderate nausea and vomiting for which she took Phenergan.  Labs in the  first trimester showed a urinalysis normal, urine culture negative,  hemoglobin 13.9, platelets 232, blood group A negative, antibodies negative,  toxo negative, RPR nonreactive, HIV nonreactive, HbsAg negative,  and rubella  immune.  In the second trimester the patient declined a triple test.  At 20  weeks she had an ultrasound review of anatomy which was within normal  limits.  Her placenta was anterior normal, cervix was 4.9 cm and closed,  dating corresponded.  Her blood pressures started to increase after 12  weeks.  Blood pressures were in the 80 to 94 range for diastolic up to 20  weeks.  At 24 weeks the patient had edema of the left leg and foot so a  Doppler was done which showed no evidence of deep venous thrombosis.  At 27  weeks one-hour GTT was within normal limits at 118.  The patient had an  ultrasound at 32+ weeks showing an estimated fetal weight at 93rd  percentile, amniotic fluid at 95th percentile, biophysical profile 8/10 that  was done for decreased fetal movements and mild PIH.  She had PIH labs done  in the third trimester which were within normal limits.  Fetal well-being  was followed more closely with NSTs and biophysical profiles which were  always reassuring.  A repeat ultrasound was done which showed an amniotic  fluid at 85th percentile at 31 weeks.  Her last ultrasound for estimated  fetal weight was at 36+ weeks and it was showing an estimated fetal weight  at 91st percentile with an amniotic fluid at 66th percentile.  Her blood  pressures in the third trimester stayed in the 90s for diastolic and the one  today was 128/98 with trace protein.   REVIEW OF SYSTEMS:  CONSTITUTIONAL:  Negative.  HEENT:  Negative.  RESPIRATORY, CARDIOVASCULAR:  Negative.  GI, URO:  Negative.  DERMATO,  ENDOCRINO:  Negative.  NEURO:  Mild occasional headaches.   PHYSICAL EXAMINATION:  GENERAL:  No apparent distress.  VITAL SIGNS:  Blood pressure 128/98; pulse regular, normal; respiratory rate  20; temperature afebrile.  LUNGS:  Clear bilaterally.  HEART:  Regular cardiac rhythm.  ABDOMEN:  Gravid uterus with a uterine height at 39, cephalic presentation.  PELVIC:  Vaginal exam 2+  dilated, 50% effaced, vertex, -3.  LOWER LIMBS:  Mild to moderate edema, no clonus.   NST reactive, no decelerations, baseline 130-140, no regular uterine  contractions.  Group B strep was negative at 35+ weeks.   IMPRESSION:  1. Gravida 1, 37+ weeks gestation with mild pregnancy-induced hypertension     and suspicion of large for gestational age, fetal well-being reassuring.  2. Ventriculoseptal defect requiring prophylaxis.   PLAN:  1. Admit to labor and delivery.  2. Pitocin low-dose protocol for induction.  3. Artificial rupture of membranes when possible.  4. Antibiotic prophylaxis with ampicillin and gentamycin when entering     active labor.  5. Monitoring.  6. Will do also PIH labs.                                               Genia Del, M.D.    ML/MEDQ  D:  03/15/2003  T:  03/15/2003  Job:  [id number]

## 2010-08-19 ENCOUNTER — Other Ambulatory Visit: Payer: Self-pay | Admitting: Internal Medicine

## 2010-12-05 ENCOUNTER — Encounter: Payer: Self-pay | Admitting: Internal Medicine

## 2010-12-09 ENCOUNTER — Encounter: Payer: Self-pay | Admitting: Internal Medicine

## 2010-12-09 ENCOUNTER — Ambulatory Visit (INDEPENDENT_AMBULATORY_CARE_PROVIDER_SITE_OTHER): Payer: BC Managed Care – PPO | Admitting: Internal Medicine

## 2010-12-09 VITALS — BP 120/76 | HR 80 | Temp 98.2°F | Ht 69.5 in | Wt 268.0 lb

## 2010-12-09 DIAGNOSIS — E785 Hyperlipidemia, unspecified: Secondary | ICD-10-CM

## 2010-12-09 DIAGNOSIS — IMO0002 Reserved for concepts with insufficient information to code with codable children: Secondary | ICD-10-CM

## 2010-12-09 DIAGNOSIS — O169 Unspecified maternal hypertension, unspecified trimester: Secondary | ICD-10-CM

## 2010-12-09 DIAGNOSIS — Z7901 Long term (current) use of anticoagulants: Secondary | ICD-10-CM

## 2010-12-09 DIAGNOSIS — F32A Depression, unspecified: Secondary | ICD-10-CM

## 2010-12-09 DIAGNOSIS — Q210 Ventricular septal defect: Secondary | ICD-10-CM

## 2010-12-09 DIAGNOSIS — F329 Major depressive disorder, single episode, unspecified: Secondary | ICD-10-CM

## 2010-12-09 DIAGNOSIS — R760 Raised antibody titer: Secondary | ICD-10-CM

## 2010-12-09 DIAGNOSIS — R894 Abnormal immunological findings in specimens from other organs, systems and tissues: Secondary | ICD-10-CM

## 2010-12-09 DIAGNOSIS — D6859 Other primary thrombophilia: Secondary | ICD-10-CM

## 2010-12-09 DIAGNOSIS — D6861 Antiphospholipid syndrome: Secondary | ICD-10-CM

## 2010-12-09 DIAGNOSIS — E669 Obesity, unspecified: Secondary | ICD-10-CM

## 2010-12-09 LAB — PROTIME-INR: INR: 2.28 — ABNORMAL HIGH (ref <1.50)

## 2010-12-11 ENCOUNTER — Encounter: Payer: Self-pay | Admitting: Internal Medicine

## 2010-12-11 DIAGNOSIS — F32A Depression, unspecified: Secondary | ICD-10-CM | POA: Insufficient documentation

## 2010-12-11 DIAGNOSIS — F329 Major depressive disorder, single episode, unspecified: Secondary | ICD-10-CM | POA: Insufficient documentation

## 2010-12-11 NOTE — Progress Notes (Signed)
  Subjective:    Patient ID: Lauren Burns, female    DOB: 1975/09/27, 35 y.o.   MRN: [id number]  HPI 35 year old white female on chronic anticoagulation therapy for inherited defect she obtained from her mother. History of antiphospholipid antibody syndrome. Mother  had DVT. Patient had pulmonary emboli December 2005 after having recently started oral contraceptives. Was subsequently diagnosed with antiphospholipid antibody syndrome and began Coumadin therapy. Now has IUD for birth control. Had laparoscopic surgery twice in 2004 for endometriosis. History of right lower lobe pneumonia in December 2005 treated with Avelox. History of pregnancy-induced hypertension. Had vaginal delivery December 2004. Had fractured right radial head 1995. History of small. Membranous ventricular septal defect diagnosed as a child and uses SBE prophylaxis. Also recently found out her husband was drinking on the job. He is employed in the family is this as a Production designer, theatre/television/film at [employer] and [employer]. He had an alcohol withdrawal seizure recently. He's been seen by a neurologist. They have a young son. She is devastated all of this was to get back on antidepressant therapy. Previously was on Cymbalta.  Mother with history of fibromyalgia, DVT, antiphospholipid antibody syndrome on chronic Coumadin therapy, history of ventricular septal defect. Father with history of alcohol abuse. Brother with history of insulin-dependent diabetes.    Review of Systems     Objective:   Physical Exam chest clear; cardiac exam regular rate and rhythm; extremities without edema; patient is not suicidal just has depressed affect.        Assessment & Plan:  Depression  History of antiphospholipid antibody on chronic Coumadin therapy  Recheck Coumadin in 4-6 weeks. Protime drawn today

## 2010-12-15 DIAGNOSIS — I10 Essential (primary) hypertension: Secondary | ICD-10-CM | POA: Insufficient documentation

## 2010-12-15 DIAGNOSIS — Q210 Ventricular septal defect: Secondary | ICD-10-CM | POA: Insufficient documentation

## 2010-12-15 DIAGNOSIS — D6861 Antiphospholipid syndrome: Secondary | ICD-10-CM | POA: Insufficient documentation

## 2010-12-15 DIAGNOSIS — E782 Mixed hyperlipidemia: Secondary | ICD-10-CM | POA: Insufficient documentation

## 2010-12-15 DIAGNOSIS — E785 Hyperlipidemia, unspecified: Secondary | ICD-10-CM | POA: Insufficient documentation

## 2011-01-16 ENCOUNTER — Telehealth: Payer: Self-pay | Admitting: Internal Medicine

## 2011-01-16 MED ORDER — DULOXETINE HCL 60 MG PO CPEP: 60.0000 mg | ORAL_CAPSULE | Freq: Every day | ORAL | Status: DC

## 2011-01-16 NOTE — Telephone Encounter (Signed)
Please call in Cymbalta 60 mg daily(#30) with 2 refills

## 2011-02-24 ENCOUNTER — Encounter: Payer: Self-pay | Admitting: Internal Medicine

## 2011-02-24 ENCOUNTER — Ambulatory Visit (INDEPENDENT_AMBULATORY_CARE_PROVIDER_SITE_OTHER): Payer: BC Managed Care – PPO | Admitting: Internal Medicine

## 2011-02-24 ENCOUNTER — Other Ambulatory Visit: Payer: Self-pay | Admitting: Internal Medicine

## 2011-02-24 VITALS — BP 130/74 | HR 80 | Temp 97.9°F | Ht 68.0 in | Wt 278.0 lb

## 2011-02-24 DIAGNOSIS — D6861 Antiphospholipid syndrome: Secondary | ICD-10-CM

## 2011-02-24 DIAGNOSIS — E669 Obesity, unspecified: Secondary | ICD-10-CM

## 2011-02-24 DIAGNOSIS — R635 Abnormal weight gain: Secondary | ICD-10-CM

## 2011-02-24 DIAGNOSIS — Z7901 Long term (current) use of anticoagulants: Secondary | ICD-10-CM

## 2011-02-24 DIAGNOSIS — D6859 Other primary thrombophilia: Secondary | ICD-10-CM

## 2011-02-24 LAB — TSH: TSH: 1.959 u[IU]/mL (ref 0.350–4.500)

## 2011-02-24 LAB — PROTIME-INR: Prothrombin Time: 24 seconds — ABNORMAL HIGH (ref 11.6–15.2)

## 2011-02-24 NOTE — Patient Instructions (Signed)
Please call [facility] bariatric coordinator for an appointment to discuss lap band surgery. Continue same dose of Coumadin. Return in 6 weeks for protime.

## 2011-02-24 NOTE — Progress Notes (Signed)
  Subjective:    Patient ID: Lauren Burns, female    DOB: 03/21/1976, 35 y.o.   MRN: [id number]  HPI 35 year old white female with history of anxiety depression, GE reflux, long-standing history of obesity, hyperlipidemia, antiphospholipid antibody syndrome, history of pulmonary embolism, history of VSD, history of pregnancy induced hypertension. In today to have protime checked on chronic Coumadin therapy for antiphospholipid antibody syndrome. Coumadin dose is 6 mg daily. Her mother has same diagnosis.  Patient also wants to discuss lap band surgery. Says that she's had issues with being overweight for many years. Says she was overweight even before starting Cymbalta for depression. Says that she's tried many types of diets and the only one that really worked with the [employer] but she knows that is not healthy. She has been trying to exercise but has been unable to lose weight. Accompanied by her mother today. She is married and has one child. Husband has had issues with alcohol addiction recently.   In July 2011 she weighed 261 pounds and her height was 67-1/2 inches; in January 2010 her weight was 216-1/2 pounds in January 2009 overweight was 222 pounds; in October 2008 weight was 229 pounds; in August 2008 weight was 243 pounds in may 2007 weight was 268 pounds, in October 2006 weight was 256 pounds, in August 2006 weight was 247 1/2 pounds. Prior to that she had not been seen here since 1997 at which time she weighed 160 pounds. In February 1995 she weighed 156 pounds. In April 1993 she weighed 166 pounds at 35 years of age.  With regard to her VSD. She had seen pediatric cardiologist in the past and felt that this VSD could simply be watched and he recommended SBE prophylaxis. He described it as a small perimembranous VSD.  She had pulmonary embolism in December 2005. Had laparoscopic surgery for endometriosis in 2004. Pregnancy December 2004. Has an IUD for contraception. GYN is [facility].  History of pneumonia as well December 2005. Had vaginal delivery with pregnancy-induced hypertension December 2004. Fractured right radial head 1995. History of irritable bowel syndrome for which Levsin has been prescribed in the past.    Review of Systems     Objective:   Physical Exam no thyromegaly        Assessment & Plan:  Obesity  Anxiety depression  History of antiphospholipid antibody syndrome  History of PE related to antiphospholipid antibody syndrome  History of pregnancy-induced hypertension  Hyperlipidemia  History of VSD (membranous)  Plan TSH drawn today along with protime. Patient is to make appointment with [facility] to discuss lap band surgery.  Protime INR is acceptable on Coumadin 6 mg daily return in 6 weeks for another pro time

## 2011-02-26 ENCOUNTER — Telehealth: Payer: Self-pay

## 2011-02-26 NOTE — Telephone Encounter (Signed)
Patient informed that her TSH was within normal limits.

## 2011-03-06 ENCOUNTER — Telehealth: Payer: Self-pay

## 2011-03-06 NOTE — Telephone Encounter (Signed)
Patient requesting her last 5 years of recorded weights, and a letter from you stating why she needs/wants bariatric surgery.

## 2011-03-09 ENCOUNTER — Encounter: Payer: Self-pay | Admitting: Internal Medicine

## 2011-03-09 ENCOUNTER — Ambulatory Visit (INDEPENDENT_AMBULATORY_CARE_PROVIDER_SITE_OTHER): Payer: BC Managed Care – PPO | Admitting: Internal Medicine

## 2011-03-09 ENCOUNTER — Ambulatory Visit
Admission: RE | Admit: 2011-03-09 | Discharge: 2011-03-09 | Disposition: A | Discharge status: Home / Self Care | Payer: BC Managed Care – PPO | Source: Ambulatory Visit | Attending: Internal Medicine | Admitting: Internal Medicine

## 2011-03-09 VITALS — BP 118/86 | HR 104 | Temp 100.7°F | Wt 278.0 lb

## 2011-03-09 DIAGNOSIS — Z7901 Long term (current) use of anticoagulants: Secondary | ICD-10-CM

## 2011-03-09 DIAGNOSIS — R05 Cough: Secondary | ICD-10-CM

## 2011-03-09 DIAGNOSIS — J111 Influenza due to unidentified influenza virus with other respiratory manifestations: Secondary | ICD-10-CM

## 2011-03-09 DIAGNOSIS — R059 Cough, unspecified: Secondary | ICD-10-CM

## 2011-03-09 DIAGNOSIS — J40 Bronchitis, not specified as acute or chronic: Secondary | ICD-10-CM

## 2011-03-09 IMAGING — CR DG CHEST 2V
2 series · 2 of 2 positions shown · non-contrast
Comparison: CTA chest [DATE] and chest radiograph of [DATE]

CLINICAL DATA: Cough and fever.  Shortness of breath and 58.

CHEST - 2 VIEW

[view not recorded (1 of 2)]
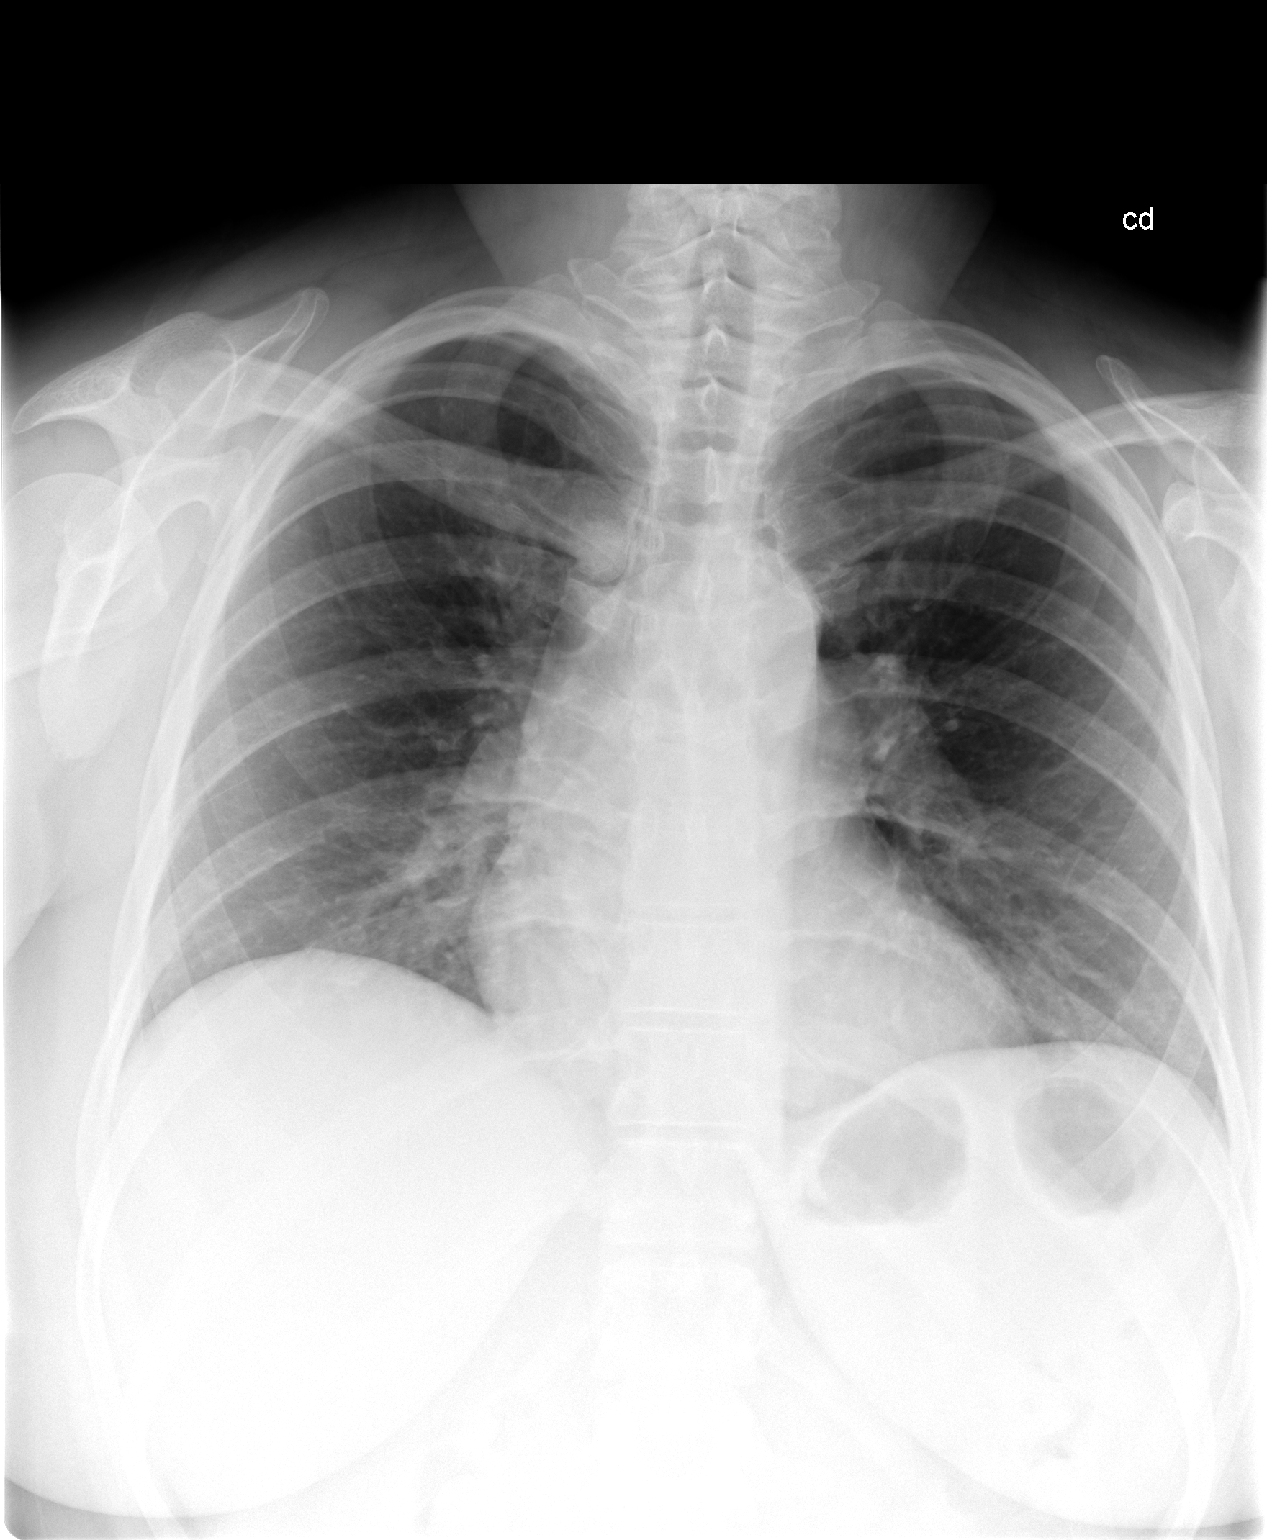

[view not recorded (2 of 2)]
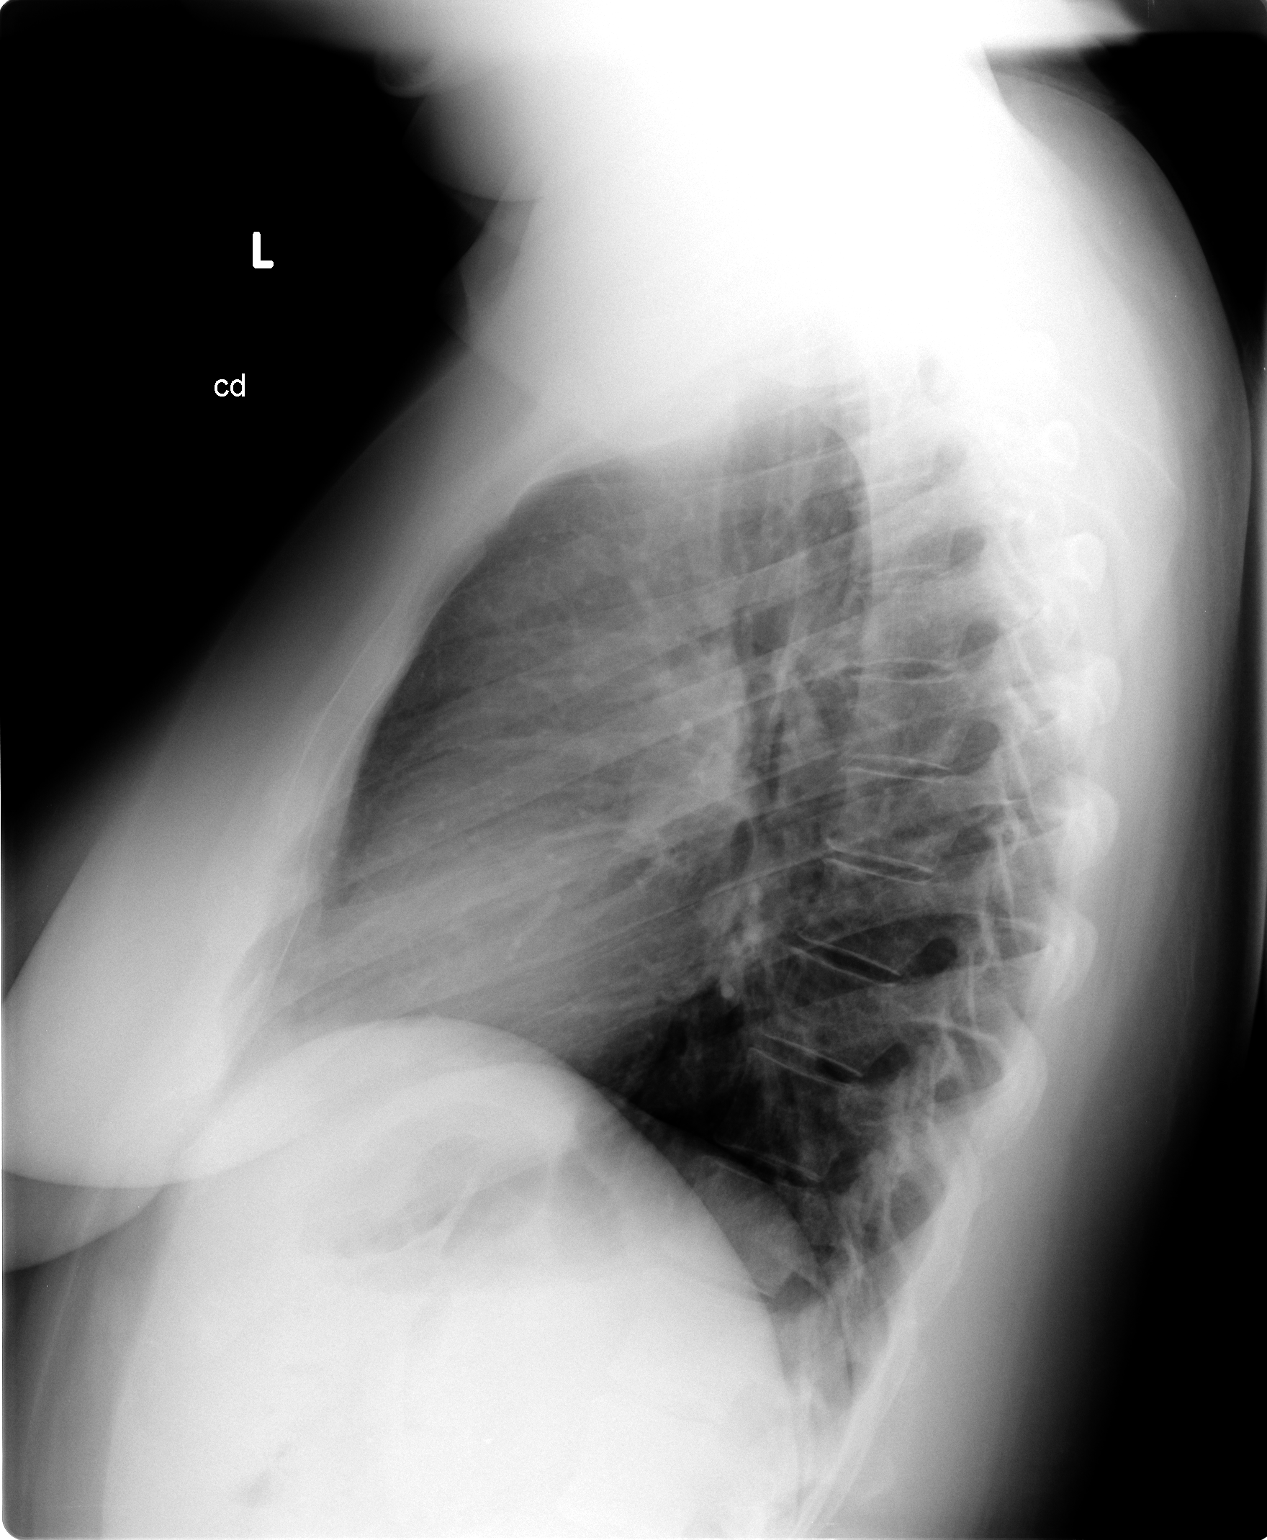

[2 of 2 positions shown; findings below may reference images not displayed]

FINDINGS: There is borderline cardiomegaly.  Thoracic aorta contour
is within normal limits.  The left hilum appears prominent.  Right
hilum appears within normal limits.  Lung volumes are slightly low.
No focal airspace disease, edema, or pleural effusion.  No acute
bony abnormality.
IMPRESSION: 1.  Left hilar prominence.  This could be due to prominence of the
central pulmonary artery, but lymphadenopathy cannot be excluded.
Short-term follow-up chest radiograph, or further evaluation with
chest CT with contrast could be performed.
2.  The lungs are clear.

## 2011-03-09 NOTE — Progress Notes (Signed)
  Subjective:    Patient ID: [name], female    DOB: 04-Jan-1976, 35 y.o.   MRN: [id number]  HPI Son has been diagnosed with the flu. She developed sore throat and URI symptoms Thursday, December 6. Today she has felt worse. Has productive cough with discolored sputum. Has had myalgias and fever today. Think she may have the flu. Did not get influenza immunization. Some wheezing. Malaise and fatigue. Decreased appetite. No vomiting or diarrhea.    Review of Systems     Objective:   Physical Exam HEENT exam: Pharynx slightly injected. TMs slightly full. Neck supple without adenopathy. Chest: decreased breath sounds right lower lobe with inspiratory wheeze. Has deep congested cough.        Assessment & Plan:  Bronchitis  Influenza  Plan: Tamiflu 75 mg twice daily for 5 days. Albuterol inhaler 2 sprays by mouth 4 times a day when necessary wheezing. Zithromax take 2 tablets by mouth day one followed by 1 tablet by mouth days 2 through 5. Hycodan 8 ounces 1 teaspoon by mouth every 6 hours when necessary cough.

## 2011-03-09 NOTE — Telephone Encounter (Signed)
This info was recorded in her last note which can be sent to Bariatric Program at [facility]. Have not heard from them however. They usually send the request. Please ask her why they have not sent the request.

## 2011-03-18 NOTE — Telephone Encounter (Signed)
Last ov note, with all her previous weights recorded, copied for patient.

## 2011-03-30 NOTE — Patient Instructions (Addendum)
Take Tamiflu for 5 days as prescribed. Take antibiotic as prescribed. Follow symptoms worsen or fail to improve in 48 hours.

## 2011-04-02 ENCOUNTER — Encounter: Payer: Self-pay | Admitting: Internal Medicine

## 2011-04-02 ENCOUNTER — Ambulatory Visit (INDEPENDENT_AMBULATORY_CARE_PROVIDER_SITE_OTHER): Payer: BC Managed Care – PPO | Admitting: Internal Medicine

## 2011-04-02 VITALS — BP 124/92 | HR 80 | Temp 97.9°F | Wt 292.0 lb

## 2011-04-02 DIAGNOSIS — Z79899 Other long term (current) drug therapy: Secondary | ICD-10-CM

## 2011-04-02 DIAGNOSIS — D6859 Other primary thrombophilia: Secondary | ICD-10-CM

## 2011-04-02 DIAGNOSIS — D6861 Antiphospholipid syndrome: Secondary | ICD-10-CM

## 2011-04-02 LAB — PROTIME-INR: INR: 2.93 — ABNORMAL HIGH (ref <1.50)

## 2011-04-02 NOTE — Progress Notes (Signed)
  Subjective:    Patient ID: Lauren Burns, female    DOB: 07-17-1975, 36 y.o.   MRN: [id number]  HPI patient in today to have repeat protime. History of antiphospholipid antibody syndrome on chronic Coumadin therapy. No complaints or problems. Patient is interested in some type of bariatric surgery. Has appointment to see general surgeon tomorrow regarding this. Needs form completed with various weights over the past 5 years which had previously been documented on progress notes. Patient recently had bout of influenza. She did not take the influenza immunization. She has fully recovered from the flu.    Review of Systems     Objective:   Physical Exam chest clear to auscultation; cardiac exam regular rate and rhythm; extremities without edema.        Assessment & Plan:  Antiphospholipid antibody syndrome currently on chronic Coumadin therapy  Obesity-wants to explore bariatric surgery options  Influenza-resolved  Plan: INR is therapeutic on current dose of Coumadin. Return in 6 weeks for protime.

## 2011-04-02 NOTE — Patient Instructions (Signed)
Continue same dose of Coumadin and return in 6 weeks. 

## 2011-04-03 ENCOUNTER — Encounter (INDEPENDENT_AMBULATORY_CARE_PROVIDER_SITE_OTHER): Payer: Self-pay | Admitting: General Surgery

## 2011-04-03 ENCOUNTER — Ambulatory Visit (INDEPENDENT_AMBULATORY_CARE_PROVIDER_SITE_OTHER): Payer: BC Managed Care – PPO | Admitting: General Surgery

## 2011-04-03 VITALS — BP 128/86 | HR 70 | Temp 97.8°F | Resp 16 | Ht 69.0 in | Wt 294.2 lb

## 2011-04-03 DIAGNOSIS — E669 Obesity, unspecified: Secondary | ICD-10-CM

## 2011-04-03 NOTE — Progress Notes (Signed)
Subjective:   Obesity  Patient ID: Lauren Burns, female   DOB: 1975-10-21, 36 y.o.   MRN: [id number]  HPI Lauren Burns y.o.female presents for consideration for surgical treatment for morbid obesity.  she gives a history of progressive obesity since early adulthood despite multiple attempts at medical management.  her weight has been affecting her in a number of ways including difficulty enjoying routine activities with her family. She has developed hyperlipidemia and borderline hypertension. She is very concerned about her longevity and long-term health risks at her current weight.  she has been to our initial information seminar, researched surgical options thoroughly and is interested in lap band surgery.   Past Medical History  Diagnosis Date  . Endometriosis   . Obesity   . Depression   . IBS (irritable bowel syndrome)   . Hyperlipidemia   . Pulmonary embolism 2004  . Antiphospholipid antibody syndrome   No past surgical history on file. Current Outpatient Prescriptions  Medication Sig Dispense Refill  . DULoxetine (CYMBALTA) 60 MG capsule Take 1 capsule (60 mg total) by mouth daily.  30 capsule  2  . hyoscyamine (LEVSINEX) 0.375 MG 12 hr capsule Take 0.375 mg by mouth 2 (two) times daily as needed.        . warfarin (COUMADIN) 6 MG tablet Take 6 mg by mouth daily. 5 and 1/2 tab       No Known Allergies History  Substance Use Topics  . Smoking status: Never Smoker   . Smokeless tobacco: Not on file  . Alcohol Use: Not on file     Review of Systems  Constitutional: Negative.   HENT: Negative.   Eyes: Negative.   Respiratory: Positive for shortness of breath (Mild with exertion). Negative for apnea and choking.   Cardiovascular: Negative.   Gastrointestinal: Negative.   Musculoskeletal: Positive for arthralgias.       Objective:   Physical Exam General: Alert, morbidly obese Caucasian female, in no distress Skin: Warm and dry without rash or  infection. HEENT: No palpable masses or thyromegaly. Sclera nonicteric. Pupils equal round and reactive. Oropharynx clear. Lymph nodes: No cervical, supraclavicular, or inguinal nodes palpable. Lungs: Breath sounds clear and equal without increased work of breathing Cardiovascular: Regular rate and rhythm without murmur. No JVD or edema. Peripheral pulses intact. Abdomen: Nondistended. Soft and nontender. No masses palpable. No organomegaly. No palpable hernias. Extremities: No edema or joint swelling or deformity. No chronic venous stasis changes. Neurologic: Alert and fully oriented. Gait normal.     Assessment:     Patient with progressive morbid obesity unresponsive to multiple efforts at medical management who presents with a BMI of 42 and comorbidities of hyperlipidemia and borderline HTN. I believe there would be very significant medical benefit from surgical weight loss. After our discussion of surgical options currently available the patient has decided to proceed with LAP-BAND placement due to the reasons above. We have discussed the nature of morbid obesity and the risk of remaining obese. We discussed the indications for the procedure, its nature, and expected recovery. The risks of the procedure were discussed in detail including anesthetic complications, bleeding, infection, visceral injury, dysphagia, and long-term risks of mechanical failure, slippage, erosion, esophageal dilatation and failure to lose weight. Rare risk of death was discussed. The patient was given a complete consent form and all questions were answered. We will proceed with preoperative workup including routine lab and x-rays, psychologic and nutrition evaluations, and upper GI series.  I will see  the patient back following this evaluation.         Plan:     As above.

## 2011-04-15 ENCOUNTER — Other Ambulatory Visit (HOSPITAL_COMMUNITY): Payer: BC Managed Care – PPO

## 2011-04-15 ENCOUNTER — Other Ambulatory Visit: Payer: Self-pay | Admitting: Internal Medicine

## 2011-04-15 ENCOUNTER — Ambulatory Visit (HOSPITAL_COMMUNITY): Payer: BC Managed Care – PPO

## 2011-04-22 ENCOUNTER — Ambulatory Visit: Payer: BC Managed Care – PPO | Admitting: *Deleted

## 2011-05-14 ENCOUNTER — Ambulatory Visit: Payer: BC Managed Care – PPO | Admitting: Internal Medicine

## 2011-05-18 ENCOUNTER — Encounter: Payer: Self-pay | Admitting: Internal Medicine

## 2011-05-18 ENCOUNTER — Ambulatory Visit (INDEPENDENT_AMBULATORY_CARE_PROVIDER_SITE_OTHER): Payer: BC Managed Care – PPO | Admitting: Internal Medicine

## 2011-05-18 VITALS — BP 110/76 | Temp 98.5°F

## 2011-05-18 DIAGNOSIS — Z7901 Long term (current) use of anticoagulants: Secondary | ICD-10-CM

## 2011-05-18 MED ORDER — DULOXETINE HCL 60 MG PO CPEP: 60.0000 mg | ORAL_CAPSULE | Freq: Every day | ORAL | Status: DC

## 2011-05-18 NOTE — Progress Notes (Signed)
  Subjective:    Patient ID: Lauren Burns, female    DOB: 02-10-1976, 36 y.o.   MRN: [id number]  HPI in today for routine pro time. Says she feels left ear is stopped up after having had a respiratory infection in [place] last week. Sounds nasally congested. Currently denies sore throat. No fever or chills today. Also needs refill on Cymbalta 60 mg daily for depression.    Review of Systems     Objective:   Physical Exam both TMs are full. The right TM is red and full and a bit dull. Left TM is full with good light reflex. Pharynx only slightly injected without exudate. Neck is supple without significant adenopathy. Chest is clear.        Assessment & Plan:  Antiphospholipid antibody syndrome on chronic Coumadin therapy  Bilateral otitis media    History of depression. Plan: Refill Cymbalta 60 mg #30 with 11 refills 1 by mouth daily. Zithromax Z-PAK take 2 tablets day one followed by 1 tablet by mouth days 2 through 5. Protime is pending. Generally we have been checking protimes every 6 weeks since she has been stable. Denies missing any Coumadin doses while ill.   Pro time INR  is low at 1.77. Return in 2 weeks for repeat protime and continue with Coumadin 6 mg daily for now.

## 2011-05-18 NOTE — Patient Instructions (Signed)
Continue Coumadin 6 mg daily and recheck in 2 weeks.

## 2011-07-06 ENCOUNTER — Ambulatory Visit: Payer: BC Managed Care – PPO | Admitting: Internal Medicine

## 2011-07-07 ENCOUNTER — Ambulatory Visit (INDEPENDENT_AMBULATORY_CARE_PROVIDER_SITE_OTHER): Payer: BC Managed Care – PPO | Admitting: Internal Medicine

## 2011-07-07 ENCOUNTER — Encounter: Payer: Self-pay | Admitting: Internal Medicine

## 2011-07-07 VITALS — BP 110/84 | HR 76 | Temp 98.7°F

## 2011-07-07 DIAGNOSIS — F32A Depression, unspecified: Secondary | ICD-10-CM

## 2011-07-07 DIAGNOSIS — M25519 Pain in unspecified shoulder: Secondary | ICD-10-CM

## 2011-07-07 DIAGNOSIS — M25512 Pain in left shoulder: Secondary | ICD-10-CM

## 2011-07-07 DIAGNOSIS — Z7901 Long term (current) use of anticoagulants: Secondary | ICD-10-CM

## 2011-07-07 DIAGNOSIS — D6859 Other primary thrombophilia: Secondary | ICD-10-CM

## 2011-07-07 DIAGNOSIS — F329 Major depressive disorder, single episode, unspecified: Secondary | ICD-10-CM

## 2011-07-07 DIAGNOSIS — E669 Obesity, unspecified: Secondary | ICD-10-CM

## 2011-07-07 DIAGNOSIS — D6861 Antiphospholipid syndrome: Secondary | ICD-10-CM

## 2011-07-07 LAB — PROTIME-INR
INR: 2.63 — ABNORMAL HIGH (ref <1.50)
Prothrombin Time: 28.3 seconds — ABNORMAL HIGH (ref 11.6–15.2)

## 2011-07-07 NOTE — Patient Instructions (Signed)
Patient informed pt/inr within therapeutic range. Coumadin dose stays the same-recheck PT in 6 weeks/ 08/18/2011.

## 2011-07-07 NOTE — Progress Notes (Signed)
  Subjective:    Patient ID: Lauren Burns, female    DOB: September 11, 1975, 36 y.o.   MRN: [id number]  HPI 36 year old white female in today for protime on chronic Coumadin therapy for antiphospholipid antibody syndrome. Patient has a number of problems she wants to discuss. Says she has had some vague musculoskeletal pain particularly in arms and legs. Her mother who has fibromyalgia think she may have a similar problem. Patient took herself off Cymbalta after weaning it slowly several weeks ago. Says she doesn't feel she needs it anymore. Her husband is a recovering alcoholic. He never went to rehabilitation nor went to AA but says he has been sober now for 7 months and seems to be doing fairly well. Less stress at home. She went to be evaluated for bariatric surgery but found out her insurance coming would not pay for it. Says she's lost 20 pounds but refused to be weighed today. Also complaining of some left musculoskeletal shoulder pain. Says she saw Dr. Charlett Blake a few years back. Not sure if she had a rotator cuff problem or some type of tendinopathy. It bothers her from time to time but she has no decreased range of motion. Wants to know what she can take for pain since she is on Coumadin. 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 She seems more upbeat today that she has in the past.    Review of Systems     Objective:   Physical Exam chest clear to auscultation; cardiac exam regular rate and rhythm; extremities without edema        Assessment & Plan:  Antiphospholipid antibody syndrome on chronic Coumadin therapy. Assuming protime is within normal limits, she will return in 6 weeks repeat pro time and office visit.  Depression-improved now off Cymbalta  Obesity-patient says she's lost 20 pounds just with diet alone and being off Cymbalta. Refused to be weighed today.  Left shoulder tendinopathy. Patient may take extra  strength Tylenol for pain. If pain persists she should go back to see Dr. Charlett Blake.  Musculoskeletal pain-patient's mother thinks that she may have fibromyalgia syndrome. Mother has fibromyalgia syndrome. Suggest patient continue with weight loss and encouraged diet and exercise and see if pain improves. Obviously Cymbalta was helping with whatever musculoskeletal pain she had a now she is off that she may indeed have some pain from deconditioning etc. We will see if symptoms persist. Hold off on room in college evaluation for now.

## 2011-08-27 ENCOUNTER — Encounter: Payer: Self-pay | Admitting: Internal Medicine

## 2011-08-27 ENCOUNTER — Ambulatory Visit (INDEPENDENT_AMBULATORY_CARE_PROVIDER_SITE_OTHER): Payer: BC Managed Care – PPO | Admitting: Internal Medicine

## 2011-08-27 VITALS — BP 134/82 | HR 72 | Temp 98.7°F

## 2011-08-27 DIAGNOSIS — E669 Obesity, unspecified: Secondary | ICD-10-CM

## 2011-08-27 DIAGNOSIS — F32A Depression, unspecified: Secondary | ICD-10-CM

## 2011-08-27 DIAGNOSIS — D6861 Antiphospholipid syndrome: Secondary | ICD-10-CM

## 2011-08-27 DIAGNOSIS — F329 Major depressive disorder, single episode, unspecified: Secondary | ICD-10-CM

## 2011-08-27 DIAGNOSIS — Z7901 Long term (current) use of anticoagulants: Secondary | ICD-10-CM

## 2011-08-27 DIAGNOSIS — D6859 Other primary thrombophilia: Secondary | ICD-10-CM

## 2011-08-27 LAB — PROTIME-INR
INR: 2.84 — ABNORMAL HIGH (ref <1.50)
Prothrombin Time: 30.1 seconds — ABNORMAL HIGH (ref 11.6–15.2)

## 2011-08-27 NOTE — Progress Notes (Signed)
  Subjective:    Patient ID: Lauren Burns, female    DOB: 24-Nov-1975, 36 y.o.   MRN: [id number]  HPI  36 year old white female with  antiphospholipid antibody syndrome on chronic Coumadin therapy. Also takes Cymbalta 60 mg daily for depression.  Is out of Cymbalta. Samples were provided. In today for  q 6 week protime check. Has decided against surgery for morbid obesity at this point in time. Is on Coumadin 6 mg daily    Review of Systems     Objective:   Physical Exam chest clear to auscultation; cardiac exam regular rate and rhythm extremities without edema        Assessment & Plan:  Antiphospholipid antibody syndrome  Chronic Coumadin therapy  History of depression  Obesity  Plan: Protime INR is stable on current dose of Coumadin of 6 mg daily. Return in 6 weeks for repeat pro time.

## 2011-08-27 NOTE — Patient Instructions (Signed)
Continue Coumadin 6 mg daily. Continue Cymbalta 60 mg daily. Return in 6 weeks for repeat protime.

## 2011-10-08 ENCOUNTER — Ambulatory Visit (INDEPENDENT_AMBULATORY_CARE_PROVIDER_SITE_OTHER): Payer: BC Managed Care – PPO | Admitting: Internal Medicine

## 2011-10-08 ENCOUNTER — Encounter: Payer: Self-pay | Admitting: Internal Medicine

## 2011-10-08 VITALS — BP 126/88 | HR 72 | Temp 98.2°F

## 2011-10-08 DIAGNOSIS — F32A Depression, unspecified: Secondary | ICD-10-CM

## 2011-10-08 DIAGNOSIS — F329 Major depressive disorder, single episode, unspecified: Secondary | ICD-10-CM

## 2011-10-08 DIAGNOSIS — H9209 Otalgia, unspecified ear: Secondary | ICD-10-CM

## 2011-10-08 DIAGNOSIS — E669 Obesity, unspecified: Secondary | ICD-10-CM

## 2011-10-08 DIAGNOSIS — Z7901 Long term (current) use of anticoagulants: Secondary | ICD-10-CM

## 2011-10-08 DIAGNOSIS — D6861 Antiphospholipid syndrome: Secondary | ICD-10-CM

## 2011-10-08 DIAGNOSIS — D6859 Other primary thrombophilia: Secondary | ICD-10-CM

## 2011-10-08 LAB — PROTIME-INR
INR: 3.15 — ABNORMAL HIGH (ref <1.50)
Prothrombin Time: 32.6 seconds — ABNORMAL HIGH (ref 11.6–15.2)

## 2011-10-08 NOTE — Patient Instructions (Addendum)
Continue Coumadin 6 mg daily. Return in 6 weeks for office visit and protime. Continue Cymbalta 60 mg daily. Try the diet exercise and lose weight.

## 2011-10-08 NOTE — Progress Notes (Signed)
Patient informed. 

## 2011-10-08 NOTE — Progress Notes (Signed)
  Subjective:    Patient ID: Lauren Burns, female    DOB: April 25, 1975, 36 y.o.   MRN: [id number]  HPI  For followup today of antiphospholipid antibody syndrome with chronic anticoagulation. Is on Coumadin 6 mg daily. Also brings up a new problem: She is complaining of some bilateral ear pain. No sore throat or URI symptoms. History of obesity and depression treated with Cymbalta 60 mg daily. Samples of Cymbalta provided. Tells me today she's quitting her job at [employer] in 2 weeks. Says many people are living in her unhappy with the management there. 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 Her husband works there is a Merchandiser, retail as well as her brother. 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 Says she's looking forward to this change. Wants me to look in her ears. Denies noncompliance with Coumadin.    Review of Systems     Objective:   Physical Exam left TM is clear, right TM slightly dull but not red. Pharynx is clear. Neck is supple. Chest is clear to auscultation. Cardiac exam regular rate and rhythm. Extremities without edema.          Assessment & Plan:  Antiphospholipid antibody syndrome  Otalgia  History of depression  Plan: Continue Cymbalta 60 mg daily. Continue Coumadin 6 mg daily. Recheck in 6 weeks with protime and office visit. No need to treat otalgia at this point in time.

## 2012-03-09 ENCOUNTER — Ambulatory Visit: Payer: BC Managed Care – PPO | Admitting: Family Medicine

## 2012-03-09 VITALS — BP 132/88 | HR 111 | Temp 98.8°F | Resp 18 | Ht 68.0 in | Wt 267.0 lb

## 2012-03-09 DIAGNOSIS — J029 Acute pharyngitis, unspecified: Secondary | ICD-10-CM

## 2012-03-09 DIAGNOSIS — J020 Streptococcal pharyngitis: Secondary | ICD-10-CM

## 2012-03-09 MED ORDER — AMOXICILLIN 500 MG PO CAPS: 500.0000 mg | ORAL_CAPSULE | Freq: Three times a day (TID) | ORAL | Status: DC

## 2012-03-09 MED ORDER — MAGIC MOUTHWASH W/LIDOCAINE: 5.0000 mL | Freq: Four times a day (QID) | ORAL | Status: DC | PRN

## 2012-03-09 NOTE — Patient Instructions (Signed)
Strep Throat Strep throat is an infection of the throat caused by a bacteria named Streptococcus pyogenes. Your caregiver may call the infection streptococcal "tonsillitis" or "pharyngitis" depending on whether there are signs of inflammation in the tonsils or back of the throat. Strep throat is most common in children from 44 to 37 years old during the cold months of the year, but it can occur in people of any age during any season. This infection is spread from person to person (contagious) through coughing, sneezing, or other close contact. SYMPTOMS   Fever or chills.  Painful, swollen, red tonsils or throat.  Pain or difficulty when swallowing.  White or yellow spots on the tonsils or throat.  Swollen, tender lymph nodes or "glands" of the neck or under the jaw.  Red rash all over the body (rare). DIAGNOSIS  Many different infections can cause the same symptoms. A test must be done to confirm the diagnosis so the right treatment can be given. A "rapid strep test" can help your caregiver make the diagnosis in a few minutes. If this test is not available, a light swab of the infected area can be used for a throat culture test. If a throat culture test is done, results are usually available in a day or two. TREATMENT  Strep throat is treated with antibiotic medicine. HOME CARE INSTRUCTIONS   Gargle with 1 tsp of salt in 1 cup of warm water, 3 to 4 times per day or as needed for comfort.  Family members who also have a sore throat or fever should be tested for strep throat and treated with antibiotics if they have the strep infection.  Make sure everyone in your household washes their hands well.  Do not share food, drinking cups, or personal items that could cause the infection to spread to others.  You may need to eat a soft food diet until your sore throat gets better.  Drink enough water and fluids to keep your urine clear or pale yellow. This will help prevent dehydration.  Get  plenty of rest.  Stay home from school, daycare, or work until you have been on antibiotics for 24 hours.  Only take over-the-counter or prescription medicines for pain, discomfort, or fever as directed by your caregiver.  If antibiotics are prescribed, take them as directed. Finish them even if you start to feel better. SEEK MEDICAL CARE IF:   The glands in your neck continue to enlarge.  You develop a rash, cough, or earache.  You cough up green, yellow-brown, or bloody sputum.  You have pain or discomfort not controlled by medicines.  Your problems seem to be getting worse rather than better. SEEK IMMEDIATE MEDICAL CARE IF:   You develop any new symptoms such as vomiting, severe headache, stiff or painful neck, chest pain, shortness of breath, or trouble swallowing.  You develop severe throat pain, drooling, or changes in your voice.  You develop swelling of the neck, or the skin on the neck becomes red and tender.  You have a fever.  You develop signs of dehydration, such as fatigue, dry mouth, and decreased urination.  You become increasingly sleepy, or you cannot wake up completely. Document Released: 03/13/2000 Document Revised: 06/08/2011 Document Reviewed: 05/15/2010 [facility] Patient Information 2013 [place], [place].   Lozenges, cold fluids, antibiotics as prescribed and magic mouthwash if needed. Return to the clinic or go to the nearest emergency room if any of your symptoms worsen or new symptoms occur.

## 2012-03-09 NOTE — Progress Notes (Signed)
Subjective:    Patient ID: Lauren Burns, female    DOB: [birth year], 36 y.o.   MRN: [id number]  HPI Lauren Burns is a 36 y.o. female Started 2 days ago in the afternoon - initially sore throat, fever (102.5 yesterday, 99-100 today), chills, body aches, bilaterral earaches down in to neck. Sore throat worst symptom.   Son with ear infections and fever last week.   Tx:OTC dayquil, nyquil, cough gtts, tylenol.   No flu shot this year.   Drinking fluids ok - but less appetite.    Review of Systems  Constitutional: Positive for fever and chills.  HENT: Positive for sore throat and trouble swallowing. Negative for voice change.   Respiratory: Positive for cough (today only. ). Negative for shortness of breath.   Gastrointestinal: Negative for abdominal pain.       Objective:   Physical Exam  Nursing note and vitals reviewed. Constitutional: She is oriented to person, place, and time. She appears well-developed and well-nourished. No distress.  HENT:  Head: Normocephalic and atraumatic.  Right Ear: Hearing, tympanic membrane, external ear and ear canal normal.  Left Ear: Hearing, tympanic membrane, external ear and ear canal normal.  Nose: Nose normal.  Mouth/Throat: Uvula is midline and mucous membranes are normal. Oropharyngeal exudate and posterior oropharyngeal erythema present. No tonsillar abscesses.       tonsilar hypertrophy R greater than left with adherent exudate. No PTA.   Eyes: Conjunctivae normal and EOM are normal. Pupils are equal, round, and reactive to light.  Neck: Trachea normal.    Cardiovascular: Normal rate, regular rhythm, normal heart sounds and intact distal pulses.   No murmur heard. Pulmonary/Chest: Effort normal and breath sounds normal. No respiratory distress. She has no wheezes. She has no rhonchi.  Abdominal: There is no hepatosplenomegaly. There is no tenderness.  Lymphadenopathy:    She has cervical adenopathy.  Neurological: She is  alert and oriented to person, place, and time.  Skin: Skin is warm and dry. No rash noted.  Psychiatric: She has a normal mood and affect. Her behavior is normal.    Results for orders placed in visit on 03/09/12  POCT RAPID STREP A (OFFICE)      Component Value Range   Rapid Strep A Screen Positive (*) Negative      Assessment & Plan:  Lauren Burns is a 36 y.o. female 1. Sore throat  POCT rapid strep A, amoxicillin (AMOXIL) 500 MG capsule, Alum & Mag Hydroxide-Simeth (MAGIC MOUTHWASH W/LIDOCAINE) SOLN  2. Strep pharyngitis  amoxicillin (AMOXIL) 500 MG capsule   Start amox, sx care. rtc precautions discussed including signs and sx's of peritonsilar abcess.   Patient Instructions  Strep Throat Strep throat is an infection of the throat caused by a bacteria named Streptococcus pyogenes. Your caregiver may call the infection streptococcal "tonsillitis" or "pharyngitis" depending on whether there are signs of inflammation in the tonsils or back of the throat. Strep throat is most common in children from 15 to 18 years old during the cold months of the year, but it can occur in people of any age during any season. This infection is spread from person to person (contagious) through coughing, sneezing, or other close contact. SYMPTOMS   Fever or chills.  Painful, swollen, red tonsils or throat.  Pain or difficulty when swallowing.  White or yellow spots on the tonsils or throat.  Swollen, tender lymph nodes or "glands" of the neck or under the jaw.  Red rash  all over the body (rare). DIAGNOSIS  Many different infections can cause the same symptoms. A test must be done to confirm the diagnosis so the right treatment can be given. A "rapid strep test" can help your caregiver make the diagnosis in a few minutes. If this test is not available, a light swab of the infected area can be used for a throat culture test. If a throat culture test is done, results are usually available in a day  or two. TREATMENT  Strep throat is treated with antibiotic medicine. HOME CARE INSTRUCTIONS   Gargle with 1 tsp of salt in 1 cup of warm water, 3 to 4 times per day or as needed for comfort.  Family members who also have a sore throat or fever should be tested for strep throat and treated with antibiotics if they have the strep infection.  Make sure everyone in your household washes their hands well.  Do not share food, drinking cups, or personal items that could cause the infection to spread to others.  You may need to eat a soft food diet until your sore throat gets better.  Drink enough water and fluids to keep your urine clear or pale yellow. This will help prevent dehydration.  Get plenty of rest.  Stay home from school, daycare, or work until you have been on antibiotics for 24 hours.  Only take over-the-counter or prescription medicines for pain, discomfort, or fever as directed by your caregiver.  If antibiotics are prescribed, take them as directed. Finish them even if you start to feel better. SEEK MEDICAL CARE IF:   The glands in your neck continue to enlarge.  You develop a rash, cough, or earache.  You cough up green, yellow-brown, or bloody sputum.  You have pain or discomfort not controlled by medicines.  Your problems seem to be getting worse rather than better. SEEK IMMEDIATE MEDICAL CARE IF:   You develop any new symptoms such as vomiting, severe headache, stiff or painful neck, chest pain, shortness of breath, or trouble swallowing.  You develop severe throat pain, drooling, or changes in your voice.  You develop swelling of the neck, or the skin on the neck becomes red and tender.  You have a fever.  You develop signs of dehydration, such as fatigue, dry mouth, and decreased urination.  You become increasingly sleepy, or you cannot wake up completely. Document Released: 03/13/2000 Document Revised: 06/08/2011 Document Reviewed: 05/15/2010 [facility]  Patient Information 2013 [place], [place].   Lozenges, cold fluids, antibiotics as prescribed and magic mouthwash if needed. Return to the clinic or go to the nearest emergency room if any of your symptoms worsen or new symptoms occur.

## 2012-03-10 ENCOUNTER — Ambulatory Visit: Payer: BC Managed Care – PPO | Admitting: Internal Medicine

## 2012-04-05 ENCOUNTER — Other Ambulatory Visit: Payer: BC Managed Care – PPO | Admitting: Internal Medicine

## 2012-04-07 ENCOUNTER — Encounter: Payer: Self-pay | Admitting: Internal Medicine

## 2012-04-07 ENCOUNTER — Other Ambulatory Visit: Payer: BC Managed Care – PPO | Admitting: Internal Medicine

## 2012-04-07 VITALS — BP 112/78 | HR 72 | Temp 97.8°F

## 2012-04-07 DIAGNOSIS — Z7901 Long term (current) use of anticoagulants: Secondary | ICD-10-CM

## 2012-04-07 NOTE — Progress Notes (Signed)
Left message with lab results. Patient to call back and schedule appt for 8 weeks from now with PT and OV, per Dr. Lenord Fellers

## 2012-04-29 ENCOUNTER — Other Ambulatory Visit: Payer: Self-pay | Admitting: Internal Medicine

## 2012-05-19 ENCOUNTER — Ambulatory Visit: Payer: BC Managed Care – PPO | Admitting: Physician Assistant

## 2012-05-19 VITALS — BP 137/90 | HR 77 | Temp 97.9°F | Resp 16 | Ht 69.5 in | Wt 274.0 lb

## 2012-05-19 DIAGNOSIS — J020 Streptococcal pharyngitis: Secondary | ICD-10-CM

## 2012-05-19 DIAGNOSIS — J029 Acute pharyngitis, unspecified: Secondary | ICD-10-CM

## 2012-05-19 MED ORDER — AMOXICILLIN 875 MG PO TABS: 875.0000 mg | ORAL_TABLET | Freq: Two times a day (BID) | ORAL | Status: DC

## 2012-05-19 NOTE — Progress Notes (Signed)
Patient ID: Lauren Burns MRN: [id number], DOB: [birth year], 37 y.o. Date of Encounter: 05/19/2012, 10:13 AM  Primary Physician: Margaree Mackintosh, MD  Chief Complaint:  Chief Complaint  Patient presents with  . Sore Throat    Since Tues, Very sore, swollen with white patches; was Pos for Strep here 03/09/12 (See OV)    HPI: 37 y.o. year old female presents with a two day history of sore throat with "white patches. Feels exactly like her sore throat in December that was RST positive. However, she has been afebrile and without chills this episode. Generalized headache and earaches. No cough, congestion, rhinorrhea, or sinus pressure. Normal hearing. No GI complaints. Able to swallow saliva, but hurts to do so. Decreased appetite secondary to sore throat. Normal appetite. No known sick contacts. Her strep throat in December did fully resolve, and she did complete all of her antibiotics. She believes that she swapped out her toothbrush for a new one at some point.   Patient currently taking warfarin for PE, diagnosed in 2005. Last INR 1.93 on 04/07/12. Advised to continue current dose and follow up in 8 weeks time from that lab.   Past Medical History  Diagnosis Date  . Endometriosis   . Obesity   . Depression   . IBS (irritable bowel syndrome)   . Pulmonary embolism 2004  . Antiphospholipid antibody syndrome   . Arthritis   . Heart murmur      Home Meds: Prior to Admission medications   Medication Sig Start Date End Date Taking? Authorizing Provider  Alum & Mag Hydroxide-Simeth (MAGIC MOUTHWASH W/LIDOCAINE) SOLN Take 5 mLs by mouth 4 (four) times daily as needed. 03/09/12  Yes Shade Flood, MD  DULoxetine (CYMBALTA) 60 MG capsule Take 1 capsule (60 mg total) by mouth daily. 05/18/11 05/19/12 Yes Margaree Mackintosh, MD  PROAIR HFA [phone] BASE) MCG/ACT inhaler  03/09/11  Yes Historical Provider, MD  warfarin (COUMADIN) 5 MG tablet take 1 tablet by mouth once daily 04/29/12  Yes Margaree Mackintosh, MD    Allergies: No Known Allergies  History   Social History  . Marital Status: Married    Spouse Name: N/A    Number of Children: N/A  . Years of Education: N/A   Occupational History  . Not on file.   Social History Main Topics  . Smoking status: Never Smoker   . Smokeless tobacco: Never Used  . Alcohol Use: No  . Drug Use: No  . Sexually Active: Not on file   Other Topics Concern  . Not on file   Social History Narrative  . No narrative on file     Review of Systems: Constitutional: negative for chills, fever, night sweats, fatigue, or weight changes HEENT: positive for sore throat. negative for nasal congestion, rhinorrhea, sinus pressure, or trouble swallowing Cardiovascular: negative for chest pain or palpitations Respiratory: negative for cough, hemoptysis, wheezing, or shortness of breath Abdominal: negative for abdominal pain, nausea, vomiting or diarrhea Dermatological: negative for rash Neurologic: positive for headache   Physical Exam: Blood pressure 137/90, pulse 77, temperature 97.9 F (36.6 C), resp. rate 16, height 5' 9.5" (1.765 m), weight 274 lb (124.286 kg), SpO2 100.00%., Body mass index is 39.9 kg/(m^2). General: Well developed, well nourished, in no acute distress. Head: Normocephalic, atraumatic, eyes without discharge, sclera non-icteric, nares are patent. Bilateral auditory canals clear, TM's are without perforation, pearly grey with reflective cone of light bilaterally. No sinus TTP. Oral cavity moist,  dentition normal. Posterior pharynx erythematous with tonsillar exudate bilaterally. Tonsils 2+ bilaterally. No peritonsillar abscess or post nasal drip. Uvula midline.  Neck: Supple. No thyromegaly. Full ROM. Lymph nodes: < 2 cm AC bilaterally. Lungs: Clear bilaterally to auscultation without wheezes, rales, or rhonchi. Breathing is unlabored. Heart: RRR with S1 S2. No murmurs, rubs, or gallops appreciated. Msk:  Strength and tone  normal for age. Extremities: No clubbing or cyanosis. No edema. Neuro: Alert and oriented X 3. Moves all extremities spontaneously. CNII-XII grossly in tact. Psych:  Responds to questions appropriately with a normal affect.   Labs: Results for orders placed in visit on 05/19/12  POCT RAPID STREP A (OFFICE)      Result Value Range   Rapid Strep A Screen Negative  Negative     ASSESSMENT AND PLAN:  37 y.o. female with strep pharyngitis and high risk medication 1) Strep pharyngitis -Amoxicillin 875 mg 1 po bid #20 no RF -Treat empirically based on history  -Tylenol/Motrin prn -Rest/fluids -RTC precautions -RTC 3-5 days if no improvement  2) High risk medication -Advised patient to follow up with PCP as directed for routine PT/INR checks, she agrees -Advised patient to RTC for PT/INR in 3 days if develops bruising, she agrees  -RTC/ER precautions  Signed, Eula Listen, PA-C 05/19/2012 10:13 AM

## 2012-06-06 ENCOUNTER — Other Ambulatory Visit: Payer: Self-pay

## 2012-06-06 MED ORDER — DULOXETINE HCL 60 MG PO CPEP: 60.0000 mg | ORAL_CAPSULE | Freq: Every day | ORAL | Status: DC

## 2012-07-21 ENCOUNTER — Ambulatory Visit: Payer: BC Managed Care – PPO | Admitting: Internal Medicine

## 2012-08-11 ENCOUNTER — Encounter: Payer: Self-pay | Admitting: Internal Medicine

## 2012-08-11 ENCOUNTER — Other Ambulatory Visit: Payer: BC Managed Care – PPO | Admitting: Internal Medicine

## 2012-08-11 ENCOUNTER — Ambulatory Visit (INDEPENDENT_AMBULATORY_CARE_PROVIDER_SITE_OTHER): Payer: BC Managed Care – PPO | Admitting: Internal Medicine

## 2012-08-11 VITALS — BP 136/86 | HR 80 | Temp 97.9°F

## 2012-08-11 DIAGNOSIS — Z7901 Long term (current) use of anticoagulants: Secondary | ICD-10-CM

## 2012-08-11 DIAGNOSIS — M25519 Pain in unspecified shoulder: Secondary | ICD-10-CM

## 2012-08-11 DIAGNOSIS — R894 Abnormal immunological findings in specimens from other organs, systems and tissues: Secondary | ICD-10-CM

## 2012-08-11 DIAGNOSIS — R760 Raised antibody titer: Secondary | ICD-10-CM

## 2012-08-11 DIAGNOSIS — M25512 Pain in left shoulder: Secondary | ICD-10-CM

## 2012-08-11 LAB — PROTIME-INR: INR: 2.04 — ABNORMAL HIGH (ref <1.50)

## 2012-08-12 NOTE — Progress Notes (Signed)
  Subjective:    Patient ID: Lauren Burns, female    DOB: 05-03-1975, 37 y.o.   MRN: [id number]  HPI Here today to followup on chronic anticoagulation therapy on Coumadin. History of antiphospholipid antibody treated with Coumadin. No issues with Coumadin. She's complaining of left shoulder pain. She took one of her father's tramadol and had some pain relief. She would like a prescription but I really don't want to pursue that at this point in time. Suggested she see specialist regarding shoulder issue and she doesn't want to do that right now. She and husband are having to run [employer] essentially by themselves because her father has been out with prostate cancer surgery and her brother is out with complications from diabetes. 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    Review of Systems     Objective:   Physical Exam she has full range of motion in left shoulder. Protime drawn and pending        Assessment & Plan:  Antiphospholipid antibody syndrome on chronic Coumadin therapy  Left shoulder pain  Plan: Patient will let he know she wants to be referred to specialist regarding left shoulder pain. No prescription for tramadol given.  Addendum: Protime INR is within normal limits on current dose of Coumadin. Return in 6 weeks for repeat protime.

## 2012-08-12 NOTE — Patient Instructions (Addendum)
Continue current dose of Coumadin and return in 6 weeks. If shoulder pain persist, consider referral to specialist

## 2012-09-29 ENCOUNTER — Ambulatory Visit: Payer: BC Managed Care – PPO | Admitting: Internal Medicine

## 2012-09-29 ENCOUNTER — Other Ambulatory Visit: Payer: BC Managed Care – PPO | Admitting: Internal Medicine

## 2012-10-04 ENCOUNTER — Encounter: Payer: Self-pay | Admitting: Internal Medicine

## 2012-10-04 ENCOUNTER — Ambulatory Visit (INDEPENDENT_AMBULATORY_CARE_PROVIDER_SITE_OTHER): Payer: BC Managed Care – PPO | Admitting: Internal Medicine

## 2012-10-04 ENCOUNTER — Other Ambulatory Visit: Payer: BC Managed Care – PPO | Admitting: Internal Medicine

## 2012-10-04 VITALS — BP 126/80 | Temp 97.7°F | Wt 273.0 lb

## 2012-10-04 DIAGNOSIS — Z7901 Long term (current) use of anticoagulants: Secondary | ICD-10-CM

## 2012-10-04 DIAGNOSIS — M25519 Pain in unspecified shoulder: Secondary | ICD-10-CM

## 2012-10-04 DIAGNOSIS — M25512 Pain in left shoulder: Secondary | ICD-10-CM

## 2012-10-04 DIAGNOSIS — D6861 Antiphospholipid syndrome: Secondary | ICD-10-CM

## 2012-10-04 DIAGNOSIS — J069 Acute upper respiratory infection, unspecified: Secondary | ICD-10-CM

## 2012-10-04 DIAGNOSIS — D6859 Other primary thrombophilia: Secondary | ICD-10-CM

## 2012-10-04 LAB — PROTIME-INR: Prothrombin Time: 24.7 seconds — ABNORMAL HIGH (ref 11.6–15.2)

## 2012-10-04 NOTE — Progress Notes (Signed)
  Subjective:    Patient ID: Lauren Burns, female    DOB: [birth year], 37 y.o.   MRN: [id number]  HPI 37 year old white female with chronic anticoagulation therapy for antiphospholipid antibody syndrome which she inherited from her mother. Here today for protime. Current dose of Coumadin is 5 mg daily. She is usually very well controlled. She has an acute respiratory infection today. His been ill for 2 days. No fever or shaking chills.  She has continued issues with her left shoulder. Says several years ago she saw Dr. Charlett Blake for evaluation. She has decreased range of motion in her left shoulder and pain with motion. 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 Previously when she had a sedentary job it was not as painful. She would like to have this evaluated. Complaining of point tenderness left anterior shoulder.    Review of Systems     Objective:   Physical Exam decreased range of motion left upper extremity with pain on motion. TMs are clear. Pharynx is clear. Neck is supple. Chest clear to auscultation.        Assessment & Plan:  Chronic anticoagulation therapy for antiphospholipid antibody syndrome-INR is stable on Coumadin 5 mg daily. Return in 6 weeks for repeat protime  Acute URI-treat symptomatically with over-the-counter medication. No antibiotic prescribed  Left shoulder pain-to be evaluated by hand surgeon.  Time spent with patient 25 minutes

## 2012-10-04 NOTE — Patient Instructions (Addendum)
Appointment will be made for hand surgeon to evaluate left shoulder. Continue Coumadin 5 mg daily. Return in 6 weeks for office visit protime. Treat URI symptomatically with over-the-counter medication

## 2012-11-15 ENCOUNTER — Other Ambulatory Visit: Payer: BC Managed Care – PPO | Admitting: Internal Medicine

## 2012-11-15 ENCOUNTER — Ambulatory Visit: Payer: BC Managed Care – PPO | Admitting: Internal Medicine

## 2012-11-16 ENCOUNTER — Other Ambulatory Visit: Payer: Self-pay | Admitting: Internal Medicine

## 2013-05-09 ENCOUNTER — Encounter: Payer: Self-pay | Admitting: Internal Medicine

## 2013-05-09 ENCOUNTER — Other Ambulatory Visit: Payer: BC Managed Care – PPO | Admitting: Internal Medicine

## 2013-05-09 ENCOUNTER — Ambulatory Visit (INDEPENDENT_AMBULATORY_CARE_PROVIDER_SITE_OTHER): Payer: BC Managed Care – PPO | Admitting: Internal Medicine

## 2013-05-09 VITALS — BP 128/88 | Temp 97.6°F | Wt 284.0 lb

## 2013-05-09 DIAGNOSIS — Z7901 Long term (current) use of anticoagulants: Secondary | ICD-10-CM

## 2013-05-09 DIAGNOSIS — R760 Raised antibody titer: Secondary | ICD-10-CM

## 2013-05-09 DIAGNOSIS — Z5181 Encounter for therapeutic drug level monitoring: Secondary | ICD-10-CM

## 2013-05-09 DIAGNOSIS — R894 Abnormal immunological findings in specimens from other organs, systems and tissues: Secondary | ICD-10-CM

## 2013-05-09 LAB — PROTIME-INR
INR: 1.69 — ABNORMAL HIGH (ref <1.50)
Prothrombin Time: 19.2 seconds — ABNORMAL HIGH (ref 11.6–15.2)

## 2013-05-09 MED ORDER — WARFARIN SODIUM 5 MG PO TABS: ORAL_TABLET | ORAL | Status: DC

## 2013-05-09 MED ORDER — WARFARIN SODIUM 1 MG PO TABS: 1.0000 mg | ORAL_TABLET | Freq: Every day | ORAL | Status: DC

## 2013-05-09 NOTE — Patient Instructions (Signed)
Increase Coumadin to 6 mg daily and return in 3 weeks. 

## 2013-05-09 NOTE — Progress Notes (Signed)
   Subjective:    Patient ID: Lauren Burns, female    DOB: 06/05/1975, 38 y.o.   MRN: [id number]  HPI Has not had protime checked since July 2014. Says she had insurance issues and had no coverage for some time. Currently on Coumadin 5 mg daily for antiphospholipid antibody positivity. Chronic Coumadin therapy has been recommended for her. Protime today is low at 1.69. Denies noncompliance with Coumadin. Has not missed any doses and has not had any dietary changes. Otherwise feels well.    Review of Systems     Objective:   Physical Exam  Spoke with patient for 10 minutes. Not examined. No bleeding issues.     Assessment & Plan:  Antiphospholipid antibody positive on chronic Coumadin therapy  Plan: Protime 1.69. Increase Coumadin to 6 mg daily and return for protime in 3 weeks.

## 2013-06-06 ENCOUNTER — Other Ambulatory Visit: Payer: BC Managed Care – PPO | Admitting: Internal Medicine

## 2013-06-06 ENCOUNTER — Ambulatory Visit: Payer: BC Managed Care – PPO | Admitting: Internal Medicine

## 2013-06-28 ENCOUNTER — Other Ambulatory Visit: Payer: Self-pay | Admitting: Internal Medicine

## 2013-07-27 ENCOUNTER — Encounter: Payer: Self-pay | Admitting: Internal Medicine

## 2013-07-27 ENCOUNTER — Other Ambulatory Visit: Payer: BC Managed Care – PPO | Admitting: Internal Medicine

## 2013-07-27 ENCOUNTER — Ambulatory Visit (INDEPENDENT_AMBULATORY_CARE_PROVIDER_SITE_OTHER): Payer: BC Managed Care – PPO | Admitting: Internal Medicine

## 2013-07-27 VITALS — BP 136/78 | Temp 98.4°F

## 2013-07-27 DIAGNOSIS — Z7901 Long term (current) use of anticoagulants: Secondary | ICD-10-CM

## 2013-07-27 DIAGNOSIS — N390 Urinary tract infection, site not specified: Secondary | ICD-10-CM

## 2013-07-27 DIAGNOSIS — R300 Dysuria: Secondary | ICD-10-CM

## 2013-07-27 DIAGNOSIS — R82998 Other abnormal findings in urine: Secondary | ICD-10-CM

## 2013-07-27 LAB — POCT URINALYSIS DIPSTICK
Bilirubin, UA: NEGATIVE
Blood, UA: NEGATIVE
Glucose, UA: NEGATIVE
KETONES UA: NEGATIVE
NITRITE UA: NEGATIVE
Protein, UA: NEGATIVE
Spec Grav, UA: <=1.005
Urobilinogen, UA: NEGATIVE
pH, UA: 7.5

## 2013-07-27 LAB — PROTIME-INR
INR: 2.05 — ABNORMAL HIGH (ref <1.50)
Prothrombin Time: 22.3 seconds — ABNORMAL HIGH (ref 11.6–15.2)

## 2013-07-27 MED ORDER — CIPROFLOXACIN HCL 500 MG PO TABS: 500.0000 mg | ORAL_TABLET | Freq: Two times a day (BID) | ORAL | Status: DC

## 2013-07-27 NOTE — Progress Notes (Signed)
   Subjective:    Patient ID: Lauren Burns, female    DOB: 07/03/1975, 38 y.o.   MRN: [id number]  HPI Is on chronic Coumadin therapy for antiphospholipid antibody syndrome. Protime INR is within normal limits. She also has dysuria and back pain. No fever or shaking chills. Symptoms have been present for several days. Urinalysis is abnormal.    Review of Systems     Objective:   Physical Exam  No CVA tenderness      Assessment & Plan:  Urinary tract infection  Chronic Coumadin therapy for antiphospholipid antibody syndrome  Plan: Return in 6 weeks for repeat protime and continue same dose of Coumadin. Protime INR is within normal limits. For urinary tract infection Cipro 500 mg twice daily for 10 days. Recheck urinalysis when she returns in 6 weeks.

## 2013-07-27 NOTE — Progress Notes (Signed)
done

## 2013-07-27 NOTE — Patient Instructions (Addendum)
Return in 6 weeks for protime recheck on urine status post treatment for UTI. Continue same dose of Coumadin.

## 2013-07-28 LAB — URINE CULTURE
COLONY COUNT: NO GROWTH
Organism ID, Bacteria: NO GROWTH

## 2013-08-10 ENCOUNTER — Telehealth: Payer: Self-pay | Admitting: Internal Medicine

## 2013-08-10 NOTE — Telephone Encounter (Signed)
Spoke with Dr. Lenord FellersBaxley; Urine Culture didn't grown anything.  UA was abnormal with "few" leukocytes.  Therefore, patient was treated for UTI.  Dr. Lenord FellersBaxley wants to see patient back tomorrow, 5/15 for repeat UA and we'll also treat the possible sinus infection that she has going on at present as well.   Left message on patient's voicemail to confirm appointment time for tomorrow.  Offered 5/15 @ 10:45 or 11:45.  Waiting for patient to confirm.

## 2013-08-10 NOTE — Telephone Encounter (Signed)
See tomorrow. Had abnormal u/a and was treated but culture had no growth.

## 2013-08-11 ENCOUNTER — Telehealth: Payer: Self-pay | Admitting: Internal Medicine

## 2013-08-11 ENCOUNTER — Ambulatory Visit: Payer: BC Managed Care – PPO | Admitting: Internal Medicine

## 2013-08-11 NOTE — Telephone Encounter (Signed)
No phone confirmation after message left on patient's cell phone at 4:40 p.m. On Thursday.

## 2013-09-07 ENCOUNTER — Ambulatory Visit: Payer: BC Managed Care – PPO | Admitting: Internal Medicine

## 2013-09-07 ENCOUNTER — Other Ambulatory Visit: Payer: BC Managed Care – PPO | Admitting: Internal Medicine

## 2013-09-14 ENCOUNTER — Other Ambulatory Visit: Payer: BC Managed Care – PPO | Admitting: Internal Medicine

## 2013-09-14 ENCOUNTER — Ambulatory Visit: Payer: BC Managed Care – PPO | Admitting: Internal Medicine

## 2013-09-14 ENCOUNTER — Encounter: Payer: Self-pay | Admitting: Internal Medicine

## 2013-12-07 ENCOUNTER — Other Ambulatory Visit: Payer: Self-pay | Admitting: Internal Medicine

## 2014-06-13 ENCOUNTER — Other Ambulatory Visit: Payer: Self-pay | Admitting: Internal Medicine

## 2014-06-14 ENCOUNTER — Telehealth: Payer: Self-pay | Admitting: *Deleted

## 2014-06-14 NOTE — Telephone Encounter (Signed)
Patient states she hasnt been getting her PT/INR checked. Patient advised she needs labs and appt for refill on her coumadin. Scheduled PT/INR for 06/15/2014. Patient scheduled for office visit on 06/18/2014 at 10:00.

## 2014-06-14 NOTE — Telephone Encounter (Signed)
We have not seen pt in some time. Where is she getting protimes done. Cannot refill med without OV

## 2014-06-15 ENCOUNTER — Other Ambulatory Visit: Payer: BLUE CROSS/BLUE SHIELD | Admitting: Internal Medicine

## 2014-06-15 ENCOUNTER — Encounter: Payer: Self-pay | Admitting: Internal Medicine

## 2014-06-15 DIAGNOSIS — Z7901 Long term (current) use of anticoagulants: Secondary | ICD-10-CM

## 2014-06-15 LAB — PROTIME-INR
INR: 1.61 — ABNORMAL HIGH (ref <1.50)
Prothrombin Time: 19.3 seconds — ABNORMAL HIGH (ref 11.6–15.2)

## 2014-06-15 NOTE — Progress Notes (Addendum)
   Subjective:    Patient ID: Lauren Burns, female    DOB: 08/06/1975, 39 y.o.   MRN: [id number]  HPI Was to have come in on Monday, March 21 for follow-up. Has not had ProTime checked in about a year. She called saying she had been out of Coumadin for 3 days. ProTime INR is subtherapeutic. Has been taking Coumadin 5 mg daily. Says she's been under a lot of stress. Had phone conversation and found this out today.    Review of Systems     Objective:   Physical Exam  Spoke with patient by telephone      Assessment & Plan:  Antiphospholipid antibody syndrome requiring chronic anticoagulation  Plan: Called in Coumadin 6 mg daily for 30 days. Patient is to return to this office on Monday, April 4  for follow-up office visit and ProTime INR

## 2014-06-15 NOTE — Addendum Note (Signed)
Addended by: Margaree MackintoshBAXLEY, Zienna Ahlin J on: 06/15/2014 05:37 PM   Modules accepted: Orders, Medications

## 2014-06-18 ENCOUNTER — Ambulatory Visit: Payer: Self-pay | Admitting: Internal Medicine

## 2014-07-02 ENCOUNTER — Ambulatory Visit: Payer: Self-pay | Admitting: Internal Medicine

## 2014-07-02 ENCOUNTER — Other Ambulatory Visit: Payer: BLUE CROSS/BLUE SHIELD | Admitting: Internal Medicine

## 2014-07-06 ENCOUNTER — Other Ambulatory Visit: Payer: BLUE CROSS/BLUE SHIELD | Admitting: Internal Medicine

## 2014-07-06 ENCOUNTER — Encounter: Payer: Self-pay | Admitting: Internal Medicine

## 2014-07-06 ENCOUNTER — Ambulatory Visit (INDEPENDENT_AMBULATORY_CARE_PROVIDER_SITE_OTHER): Payer: BLUE CROSS/BLUE SHIELD | Admitting: Internal Medicine

## 2014-07-06 VITALS — BP 130/90

## 2014-07-06 DIAGNOSIS — Z7901 Long term (current) use of anticoagulants: Secondary | ICD-10-CM

## 2014-07-06 DIAGNOSIS — Z5181 Encounter for therapeutic drug level monitoring: Secondary | ICD-10-CM

## 2014-07-06 DIAGNOSIS — F4321 Adjustment disorder with depressed mood: Secondary | ICD-10-CM

## 2014-07-06 DIAGNOSIS — D6861 Antiphospholipid syndrome: Secondary | ICD-10-CM | POA: Diagnosis not present

## 2014-07-06 LAB — PROTIME-INR
INR: 4.13 — ABNORMAL HIGH (ref <1.50)
Prothrombin Time: 40.3 seconds — ABNORMAL HIGH (ref 11.6–15.2)

## 2014-07-06 MED ORDER — WARFARIN SODIUM 5 MG PO TABS: 5.0000 mg | ORAL_TABLET | Freq: Every day | ORAL | Status: DC

## 2014-07-06 NOTE — Patient Instructions (Signed)
Discontinue Coumadin for 3 days. Then start Coumadin 5 mg daily and return in 2 weeks.

## 2014-07-06 NOTE — Progress Notes (Signed)
   Subjective:    Patient ID: Lauren Burns, female    DOB: 07/10/1975, 39 y.o.   MRN: [id number]  HPI  39 year old female in today for follow-up on anticoagulation. Pro time INR is high at 4.13 and previously was 1.61. At last visit we increased Coumadin from 5 to 6 mg daily. She says she has not taken an extra dose by mistake.  She is having grief because of her dog who was 795 years of age had be euthanized due to liver disease.    Review of Systems     Objective:   Physical Exam  Not examined. Blood pressure borderline elevated but she is stressed      Assessment & Plan:  Chronic anticoagulation  Antiphospholipid antibody syndrome  Grief reaction  Plan: Hold Coumadin for 3 days then start Coumadin 5 mg daily and return in 2 weeks.

## 2014-07-19 ENCOUNTER — Other Ambulatory Visit: Payer: Self-pay | Admitting: Internal Medicine

## 2014-07-20 ENCOUNTER — Other Ambulatory Visit: Payer: BLUE CROSS/BLUE SHIELD | Admitting: Internal Medicine

## 2014-07-20 ENCOUNTER — Ambulatory Visit: Payer: BLUE CROSS/BLUE SHIELD | Admitting: Internal Medicine

## 2014-07-24 ENCOUNTER — Other Ambulatory Visit: Payer: BLUE CROSS/BLUE SHIELD | Admitting: Internal Medicine

## 2014-07-24 ENCOUNTER — Ambulatory Visit: Payer: BLUE CROSS/BLUE SHIELD | Admitting: Internal Medicine

## 2014-07-26 ENCOUNTER — Encounter: Payer: Self-pay | Admitting: Internal Medicine

## 2014-07-26 ENCOUNTER — Ambulatory Visit (INDEPENDENT_AMBULATORY_CARE_PROVIDER_SITE_OTHER): Payer: BLUE CROSS/BLUE SHIELD | Admitting: Internal Medicine

## 2014-07-26 ENCOUNTER — Other Ambulatory Visit: Payer: BLUE CROSS/BLUE SHIELD | Admitting: Internal Medicine

## 2014-07-26 VITALS — BP 126/82 | HR 81 | Temp 97.8°F | Wt 284.0 lb

## 2014-07-26 DIAGNOSIS — R791 Abnormal coagulation profile: Secondary | ICD-10-CM

## 2014-07-26 DIAGNOSIS — D6861 Antiphospholipid syndrome: Secondary | ICD-10-CM

## 2014-07-26 DIAGNOSIS — Z5181 Encounter for therapeutic drug level monitoring: Secondary | ICD-10-CM

## 2014-07-26 DIAGNOSIS — Z7901 Long term (current) use of anticoagulants: Secondary | ICD-10-CM

## 2014-07-26 LAB — PROTIME-INR
INR: 1.69 — ABNORMAL HIGH (ref <1.50)
Prothrombin Time: 20 seconds — ABNORMAL HIGH (ref 11.6–15.2)

## 2014-07-26 NOTE — Progress Notes (Signed)
   Subjective:    Patient ID: Lauren Burns, female    DOB: 08/03/1975, 39 y.o.   MRN: [id number]  HPI at last visit ProTime INR was high at 4.13. There was no good explanation for this on Coumadin 6 mg daily. We held it for 3 days and then drop dose to 5 mg daily. She feels fine. However ProTime INR is now low again at 1.69. Probably 5.5 to-6 mg would be good if compliant. She denies being noncompliant. She took 6 mg for a number of years and did well. She still has some 6 mg tablets at home and will take those and return in 2 weeks for ProTime without office visit    Review of Systems     Objective:   Physical Exam  ProTime INR 1.69      Assessment & Plan:  Subtherapeutic ProTime INR  Plan: Increase Coumadin to 6 mg daily and return in 2 weeks for ProTime INR without office visit

## 2014-07-26 NOTE — Patient Instructions (Signed)
Increase Coumadin to 6 mg daily and return in 2 weeks. 

## 2014-08-10 ENCOUNTER — Other Ambulatory Visit: Payer: BLUE CROSS/BLUE SHIELD | Admitting: Internal Medicine

## 2014-08-10 ENCOUNTER — Other Ambulatory Visit: Payer: Self-pay | Admitting: *Deleted

## 2014-08-10 DIAGNOSIS — Z7901 Long term (current) use of anticoagulants: Secondary | ICD-10-CM

## 2014-08-10 DIAGNOSIS — Z5181 Encounter for therapeutic drug level monitoring: Secondary | ICD-10-CM

## 2014-08-10 LAB — PROTIME-INR
INR: 2.32 — AB (ref <1.50)
Prothrombin Time: 25.7 seconds — ABNORMAL HIGH (ref 11.6–15.2)

## 2014-08-10 MED ORDER — WARFARIN SODIUM 6 MG PO TABS: 6.0000 mg | ORAL_TABLET | Freq: Every day | ORAL | Status: DC

## 2014-08-10 NOTE — Telephone Encounter (Signed)
Sent refill on coumadin to patient pharmacy

## 2014-08-21 ENCOUNTER — Other Ambulatory Visit: Payer: Self-pay | Admitting: Internal Medicine

## 2014-09-13 ENCOUNTER — Telehealth: Payer: Self-pay | Admitting: Internal Medicine

## 2014-09-13 ENCOUNTER — Ambulatory Visit: Payer: BLUE CROSS/BLUE SHIELD | Admitting: Internal Medicine

## 2014-09-13 ENCOUNTER — Other Ambulatory Visit: Payer: BLUE CROSS/BLUE SHIELD | Admitting: Internal Medicine

## 2014-09-13 NOTE — Telephone Encounter (Signed)
Patient did not show today for her STAT PT/INR.  Called and [facility] for patient to call me to R/S this appointment and office visit.  Advised that Dr. Lenord Fellers will not be in the office on Friday, 6/17 and to please call to R/S Lab and OV ASAP for next week.  Dr. Lenord Fellers will also not be in the office on Friday, 6/24.  Will wait for patient to call back and R/S this appointment.  Message stated that we do not want her to get off of her regimen of her Coumadin therapy that she is currently on.

## 2014-09-26 ENCOUNTER — Encounter: Payer: Self-pay | Admitting: Internal Medicine

## 2014-10-04 ENCOUNTER — Telehealth: Payer: Self-pay | Admitting: *Deleted

## 2014-10-04 ENCOUNTER — Encounter: Payer: Self-pay | Admitting: Internal Medicine

## 2014-10-04 ENCOUNTER — Ambulatory Visit (INDEPENDENT_AMBULATORY_CARE_PROVIDER_SITE_OTHER): Payer: BLUE CROSS/BLUE SHIELD | Admitting: Internal Medicine

## 2014-10-04 VITALS — BP 124/84 | HR 78 | Temp 98.0°F | Wt 268.0 lb

## 2014-10-04 DIAGNOSIS — Z7901 Long term (current) use of anticoagulants: Secondary | ICD-10-CM | POA: Diagnosis not present

## 2014-10-04 DIAGNOSIS — N390 Urinary tract infection, site not specified: Secondary | ICD-10-CM | POA: Diagnosis not present

## 2014-10-04 DIAGNOSIS — R8290 Unspecified abnormal findings in urine: Secondary | ICD-10-CM | POA: Diagnosis not present

## 2014-10-04 DIAGNOSIS — R350 Frequency of micturition: Secondary | ICD-10-CM

## 2014-10-04 DIAGNOSIS — D6861 Antiphospholipid syndrome: Secondary | ICD-10-CM

## 2014-10-04 LAB — POCT URINALYSIS DIPSTICK
Bilirubin, UA: NEGATIVE
GLUCOSE UA: NEGATIVE
KETONES UA: NEGATIVE
Nitrite, UA: NEGATIVE
Protein, UA: NEGATIVE
SPEC GRAV UA: 1.015
Urobilinogen, UA: NEGATIVE
pH, UA: 6.5

## 2014-10-04 LAB — PROTIME-INR
INR: 2.11 — AB (ref <1.50)
PROTHROMBIN TIME: 23.9 s — AB (ref 11.6–15.2)

## 2014-10-04 MED ORDER — CIPROFLOXACIN HCL 500 MG PO TABS: 500.0000 mg | ORAL_TABLET | Freq: Two times a day (BID) | ORAL | Status: DC

## 2014-10-04 NOTE — Telephone Encounter (Signed)
Reviewed PT/INR results and instructions for coumadin dose. Patient schedule to be seen 11/15/14

## 2014-10-04 NOTE — Patient Instructions (Signed)
Take Cipro as prescribed. ProTime drawn today on Coumadin 6 mg daily with results pending.

## 2014-10-04 NOTE — Progress Notes (Signed)
   Subjective:    Patient ID: Lauren Burns, female    DOB: 04/20/1975, 39 y.o.   MRN: [id number]  HPI Has dysuria, decreased urinary frequency, some low back pain. No fever or shaking chills nausea or vomiting.  Also did not keep  recent anticoagulation appointment. Was sent letter in this regard. This was discussed today.    Review of Systems see above     Objective:   Physical Exam  No CVA tenderness. Urinalysis is abnormal. Culture sent.      Assessment & Plan:   Acute UTI-treat with Cipro 500 mg twice daily for 7 days  Chronic anticoagulation-and a phospholipid antibody syndrome stable on Coumadin 6 mg daily. ProTime INR 2.11. Continue same dose and return in 6 weeks. In conversation with patient today about missing appointments for anticoagulation follow-up. She was also recently sent a letter in this regard. Repeated of failure to keep appointments will result in dismissal.

## 2014-10-07 LAB — URINE CULTURE

## 2014-10-16 ENCOUNTER — Encounter: Payer: Self-pay | Admitting: Internal Medicine

## 2014-10-16 ENCOUNTER — Ambulatory Visit (INDEPENDENT_AMBULATORY_CARE_PROVIDER_SITE_OTHER): Payer: BLUE CROSS/BLUE SHIELD | Admitting: Internal Medicine

## 2014-10-16 VITALS — BP 138/88 | HR 83 | Temp 98.3°F | Ht 69.5 in | Wt 286.0 lb

## 2014-10-16 DIAGNOSIS — K580 Irritable bowel syndrome with diarrhea: Secondary | ICD-10-CM | POA: Diagnosis not present

## 2014-10-16 DIAGNOSIS — Z8742 Personal history of other diseases of the female genital tract: Secondary | ICD-10-CM

## 2014-10-16 DIAGNOSIS — R102 Pelvic and perineal pain: Secondary | ICD-10-CM

## 2014-10-16 DIAGNOSIS — N949 Unspecified condition associated with female genital organs and menstrual cycle: Secondary | ICD-10-CM | POA: Diagnosis not present

## 2014-10-16 DIAGNOSIS — R3915 Urgency of urination: Secondary | ICD-10-CM | POA: Diagnosis not present

## 2014-10-16 LAB — POCT URINALYSIS DIPSTICK
Bilirubin, UA: NEGATIVE
GLUCOSE UA: NEGATIVE
Ketones, UA: NEGATIVE
Nitrite, UA: NEGATIVE
PH UA: 8
PROTEIN UA: NEGATIVE
Spec Grav, UA: >=1.03
Urobilinogen, UA: NEGATIVE

## 2014-10-16 LAB — HEMOCCULT GUIAC POC 1CARD (OFFICE): Fecal Occult Blood, POC: POSITIVE — AB

## 2014-10-16 MED ORDER — HYOSCYAMINE SULFATE 0.125 MG SL SUBL: SUBLINGUAL_TABLET | SUBLINGUAL | Status: DC

## 2014-10-16 NOTE — Progress Notes (Signed)
   Subjective:    Patient ID: Lauren Burns, female    DOB: 07/24/1975, 39 y.o.   MRN: [id number]  HPI  On July 7th, had E.coli UTI treated for 7 days with Cipro which organism was sensitive to. Symptoms improved. However for the past few months she's been having issues with diarrhea particular after eating. Cannot eat [place] food. Sometimes other foods upset her stomach. Not sure that there is any relationship to lactose but she hasn't thought about it very much. She has no prior history of colitis or inflammatory bowel disease. She is on Coumadin for and a phospholipid antibody syndrome. Thinks she saw some blood when she wiped after a bowel movement recently. May of seen a few drops of blood in the toilet bowl.  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 Under fair amount of stress.  Has had one episode of fecal incontinence when she could not get to the restroom soon enough.  She also has a Mirena device which is been in for 5 years and needs to be removed. Is complaining of some vague abdominal cramping.  Urinalysis today is slightly abnormal and will be recultured.       Review of Systems     Objective:   Physical Exam  Abdomen is soft nondistended with slightly increased bowel sounds throughout. No hepatosplenomegaly masses or significant tenderness. Stool is trace guaiac positive. She does have some rectal irritation      Assessment & Plan:  Probable irritable bowel syndrome  Tobi Bastosnna phospholipid antibody syndrome on chronic anticoagulation  ? Recurrent UTI  Plan: Urine culture is pending. She will try Levsin sublingual before meals and see if that helps diarrhea. If symptoms persist, she will need to see gastroenterologist. She will also see GYN physician about Mirena device with history of endometriosis.Carollee HerterShannon

## 2014-10-16 NOTE — Patient Instructions (Signed)
Try Levsin one half hour before meals. Watch to see if foods trigger diarrhea. If symptoms persist, call back and we will refer you to gastroneurologist. Urine has been recultured. Please see GYN regarding Mirena device and history of endometriosis

## 2014-10-18 LAB — URINE CULTURE: Colony Count: 60000

## 2014-10-19 ENCOUNTER — Other Ambulatory Visit: Payer: Self-pay | Admitting: Internal Medicine

## 2014-11-15 ENCOUNTER — Other Ambulatory Visit: Payer: BLUE CROSS/BLUE SHIELD | Admitting: Internal Medicine

## 2014-11-15 ENCOUNTER — Ambulatory Visit (INDEPENDENT_AMBULATORY_CARE_PROVIDER_SITE_OTHER): Payer: BLUE CROSS/BLUE SHIELD | Admitting: Internal Medicine

## 2014-11-15 ENCOUNTER — Encounter: Payer: Self-pay | Admitting: Internal Medicine

## 2014-11-15 VITALS — BP 132/80 | HR 81 | Temp 97.9°F | Wt 297.5 lb

## 2014-11-15 DIAGNOSIS — D6861 Antiphospholipid syndrome: Secondary | ICD-10-CM | POA: Diagnosis not present

## 2014-11-15 DIAGNOSIS — Z5181 Encounter for therapeutic drug level monitoring: Secondary | ICD-10-CM

## 2014-11-15 DIAGNOSIS — Z7901 Long term (current) use of anticoagulants: Secondary | ICD-10-CM

## 2014-11-15 DIAGNOSIS — R791 Abnormal coagulation profile: Secondary | ICD-10-CM | POA: Diagnosis not present

## 2014-11-15 LAB — PROTIME-INR
INR: 3.71 — ABNORMAL HIGH (ref <1.50)
PROTHROMBIN TIME: 37 s — AB (ref 11.6–15.2)

## 2014-11-15 MED ORDER — WARFARIN SODIUM 1 MG PO TABS: 1.0000 mg | ORAL_TABLET | Freq: Every day | ORAL | Status: DC

## 2014-11-15 MED ORDER — WARFARIN SODIUM 5 MG PO TABS: 5.0000 mg | ORAL_TABLET | Freq: Every day | ORAL | Status: DC

## 2014-11-15 NOTE — Patient Instructions (Addendum)
Change Coumadin to 5.5 mg daily after holding dose for 3 days. Return September 2 for ProTime INR and follow-up.

## 2014-11-27 NOTE — Progress Notes (Signed)
   Subjective:    Patient ID: Lauren Burns, female    DOB: [birth year], 39 y.o.   MRN: [id number]  HPI  39 year old female with anti-phospholipid antibody syndrome in today for follow-up on chronic anticoagulation with Coumadin. Denies taking too much medication but ProTime INR is high today. She has been taking some preparation with tumeric in it. Perhaps should not take extra supplements at all. No bleeding or bruising.    Review of Systems     Objective:   Physical Exam Not examined. ProTime INR is high at 3.71 on Coumadin 6 mg daily. Reviewed with her foods and supplements that can alter ProTime       Assessment & Plan:  Abnormal ProTime INR in excess of 3  Anna phospholipid antibody syndrome  Plan: Hold Coumadin for 3 days and decrease to 5.5 mg daily. Return September 2 for follow-up

## 2014-11-30 ENCOUNTER — Ambulatory Visit: Payer: BLUE CROSS/BLUE SHIELD | Admitting: Internal Medicine

## 2014-11-30 ENCOUNTER — Other Ambulatory Visit: Payer: BLUE CROSS/BLUE SHIELD | Admitting: Internal Medicine

## 2014-11-30 DIAGNOSIS — Z029 Encounter for administrative examinations, unspecified: Secondary | ICD-10-CM

## 2014-12-12 ENCOUNTER — Telehealth: Payer: Self-pay | Admitting: *Deleted

## 2014-12-12 NOTE — Telephone Encounter (Signed)
Patient no showed her last appt for PT/INR and office visit. Called and left message for patient per Dr Lenord Fellers patient will need to come in for Stat PT/INR and office visit before we can refill coumadin. Patient will be out of coumadin 12/16/14.

## 2014-12-25 ENCOUNTER — Other Ambulatory Visit: Payer: BLUE CROSS/BLUE SHIELD | Admitting: Internal Medicine

## 2014-12-25 ENCOUNTER — Encounter: Payer: Self-pay | Admitting: Internal Medicine

## 2014-12-25 ENCOUNTER — Ambulatory Visit (INDEPENDENT_AMBULATORY_CARE_PROVIDER_SITE_OTHER): Payer: BLUE CROSS/BLUE SHIELD | Admitting: Internal Medicine

## 2014-12-25 VITALS — BP 112/76 | HR 81 | Temp 97.9°F | Wt 295.0 lb

## 2014-12-25 DIAGNOSIS — Z5181 Encounter for therapeutic drug level monitoring: Secondary | ICD-10-CM

## 2014-12-25 DIAGNOSIS — Z23 Encounter for immunization: Secondary | ICD-10-CM

## 2014-12-25 DIAGNOSIS — Z7901 Long term (current) use of anticoagulants: Secondary | ICD-10-CM

## 2014-12-25 LAB — PROTIME-INR
INR: 2.81 — ABNORMAL HIGH (ref <1.50)
Prothrombin Time: 30.2 seconds — ABNORMAL HIGH (ref 11.6–15.2)

## 2014-12-25 MED ORDER — WARFARIN SODIUM 5 MG PO TABS: 5.0000 mg | ORAL_TABLET | Freq: Every day | ORAL | Status: DC

## 2014-12-25 NOTE — Patient Instructions (Signed)
Continue Coumadin 5.5. mg daily. RTC in 6 weeks.

## 2014-12-26 NOTE — Progress Notes (Signed)
   Subjective:    Patient ID: Lauren Burns, female    DOB: July 20, 1975, 39 y.o.   MRN: [id number]  HPI Patient with anti-phospholipid antibody syndrome here today for follow-up on chronic anticoagulation. Currently taking Coumadin 5.5 mg daily. Denies noncompliance with medication. Apparently borrowed some of her mother's Coumadin recently because she was running out before she could get to the office to have pro time checked. No new complaints or problems    Review of Systems     Objective:   Physical Exam  No bleeding issues. ProTime INR 2.81      Assessment & Plan:  Chronic anticoagulation-stable on Coumadin 5.5 mg daily  Plan: Return in 6 weeks for office visit and ProTime INR.

## 2015-01-31 ENCOUNTER — Other Ambulatory Visit: Payer: Self-pay | Admitting: Internal Medicine

## 2015-02-05 ENCOUNTER — Ambulatory Visit: Payer: BLUE CROSS/BLUE SHIELD | Admitting: Internal Medicine

## 2015-02-05 ENCOUNTER — Other Ambulatory Visit: Payer: BLUE CROSS/BLUE SHIELD | Admitting: Internal Medicine

## 2015-02-08 ENCOUNTER — Ambulatory Visit: Payer: BLUE CROSS/BLUE SHIELD | Admitting: Internal Medicine

## 2015-02-08 ENCOUNTER — Other Ambulatory Visit: Payer: BLUE CROSS/BLUE SHIELD | Admitting: Internal Medicine

## 2015-02-08 DIAGNOSIS — Z5181 Encounter for therapeutic drug level monitoring: Secondary | ICD-10-CM

## 2015-02-08 DIAGNOSIS — Z7901 Long term (current) use of anticoagulants: Secondary | ICD-10-CM

## 2015-02-08 DIAGNOSIS — Z79899 Other long term (current) drug therapy: Secondary | ICD-10-CM

## 2015-02-08 LAB — PROTIME-INR
INR: 2.81 — ABNORMAL HIGH (ref <1.50)
PROTHROMBIN TIME: 30.2 s — AB (ref 11.6–15.2)

## 2015-02-11 ENCOUNTER — Ambulatory Visit (INDEPENDENT_AMBULATORY_CARE_PROVIDER_SITE_OTHER): Payer: BLUE CROSS/BLUE SHIELD | Admitting: Internal Medicine

## 2015-02-11 ENCOUNTER — Encounter: Payer: Self-pay | Admitting: Internal Medicine

## 2015-02-11 VITALS — BP 118/84 | HR 74 | Temp 97.7°F | Resp 20 | Ht 69.0 in | Wt 300.0 lb

## 2015-02-11 DIAGNOSIS — D6861 Antiphospholipid syndrome: Secondary | ICD-10-CM | POA: Diagnosis not present

## 2015-02-11 DIAGNOSIS — G5712 Meralgia paresthetica, left lower limb: Secondary | ICD-10-CM | POA: Diagnosis not present

## 2015-02-11 MED ORDER — GABAPENTIN 100 MG PO CAPS: 100.0000 mg | ORAL_CAPSULE | Freq: Every day | ORAL | Status: DC

## 2015-02-11 MED ORDER — GABAPENTIN 300 MG PO CAPS: 300.0000 mg | ORAL_CAPSULE | Freq: Every day | ORAL | Status: DC

## 2015-02-26 NOTE — Patient Instructions (Signed)
You have been diagnosed with meralgia paresthetica. No treatment necessary at this point in time. Continue same dose of Coumadin and return for repeat pro time in 6 weeks

## 2015-02-26 NOTE — Progress Notes (Signed)
   Subjective:    Patient ID: Lauren Burns, female    DOB: 10/02/1975, 39 y.o.   MRN: [id number]  HPI Here today to follow-up on ProTime with chronic Coumadin therapy for antiphospholipid antibody syndrome. Also complaining of numbness left lateral leg which she's had for some time.    Review of Systems     Objective:   Physical Exam  Muscle strength is normal in the left lower extremity. Has paresthesias left lateral leg. Does stand on her feet a fair amount at work.      Assessment & Plan:  Meralgia paresthetica left leg  Antiphospholipid antibody syndrome requiring chronic Coumadin therapy  Plan: ProTime INR is normal on current dose of Coumadin. Return in 6 weeks. No treatment necessary for meralgia paresthetica

## 2015-03-03 ENCOUNTER — Other Ambulatory Visit: Payer: Self-pay | Admitting: Internal Medicine

## 2015-03-28 ENCOUNTER — Ambulatory Visit (INDEPENDENT_AMBULATORY_CARE_PROVIDER_SITE_OTHER): Payer: BLUE CROSS/BLUE SHIELD | Admitting: Internal Medicine

## 2015-03-28 ENCOUNTER — Other Ambulatory Visit: Payer: BLUE CROSS/BLUE SHIELD | Admitting: Internal Medicine

## 2015-03-28 ENCOUNTER — Encounter: Payer: Self-pay | Admitting: Internal Medicine

## 2015-03-28 VITALS — BP 114/78 | HR 79 | Temp 97.6°F | Resp 20 | Ht 69.0 in | Wt 285.0 lb

## 2015-03-28 DIAGNOSIS — D6861 Antiphospholipid syndrome: Secondary | ICD-10-CM

## 2015-03-28 DIAGNOSIS — Z7901 Long term (current) use of anticoagulants: Secondary | ICD-10-CM

## 2015-03-28 DIAGNOSIS — F411 Generalized anxiety disorder: Secondary | ICD-10-CM

## 2015-03-28 LAB — PROTIME-INR
INR: 2.38 — AB (ref <1.50)
PROTHROMBIN TIME: 26.5 s — AB (ref 11.6–15.2)

## 2015-03-28 MED ORDER — ALPRAZOLAM 0.5 MG PO TABS: 0.5000 mg | ORAL_TABLET | Freq: Every day | ORAL | Status: DC

## 2015-03-28 MED ORDER — WARFARIN SODIUM 5 MG PO TABS: 5.0000 mg | ORAL_TABLET | Freq: Every day | ORAL | Status: DC

## 2015-03-28 NOTE — Patient Instructions (Signed)
Take Xanax sparingly as needed for insomnia. Continue Coumadin 5 mg daily and RTC in 6 weeks.

## 2015-03-29 NOTE — Progress Notes (Signed)
   Subjective:    Patient ID: Desiree Hanehristy A Donate, female    DOB: 04/29/1975, 39 y.o.   MRN: [id number]  HPI 39 year old White Female with history of antiphospholipid antibody requiring chronic anticoagulation in today for follow-up on Coumadin therapy. Currently taking Coumadin 5 mg daily. Used to take Coumadin 5.5 mg daily. ProTime INR is acceptable at 2.38. Patient also indicates she's been having some issues with sleep. Worries a lot wakes up in the middle of the night and has issues going back to sleep.    Review of Systems     Objective:   Physical Exam  Not examined. Spent 15 minutes taken with patient about these issues.      Assessment & Plan:  Antiphospholipid antibody syndrome requiring chronic anticoagulation  Insomnia  Plan: Xanax 0.5 mg 1 by mouth daily at bedtime when necessary insomnia. Patient to take sparingly. Continue Coumadin 5 mg daily and return in 6 weeks for office visit and ProTime.

## 2015-05-09 ENCOUNTER — Ambulatory Visit: Payer: BLUE CROSS/BLUE SHIELD | Admitting: Internal Medicine

## 2015-05-09 ENCOUNTER — Other Ambulatory Visit: Payer: BLUE CROSS/BLUE SHIELD | Admitting: Internal Medicine

## 2015-05-15 ENCOUNTER — Other Ambulatory Visit: Payer: Self-pay | Admitting: Radiology

## 2015-06-13 ENCOUNTER — Ambulatory Visit (INDEPENDENT_AMBULATORY_CARE_PROVIDER_SITE_OTHER): Payer: BLUE CROSS/BLUE SHIELD | Admitting: Internal Medicine

## 2015-06-13 ENCOUNTER — Encounter: Payer: Self-pay | Admitting: Internal Medicine

## 2015-06-13 ENCOUNTER — Other Ambulatory Visit: Payer: BLUE CROSS/BLUE SHIELD | Admitting: Internal Medicine

## 2015-06-13 VITALS — BP 138/92 | HR 70 | Temp 97.4°F | Resp 18 | Ht 69.0 in | Wt 298.0 lb

## 2015-06-13 DIAGNOSIS — Z7901 Long term (current) use of anticoagulants: Secondary | ICD-10-CM

## 2015-06-13 DIAGNOSIS — D6861 Antiphospholipid syndrome: Secondary | ICD-10-CM

## 2015-06-13 LAB — PROTIME-INR
INR: 1.51 — ABNORMAL HIGH (ref <1.50)
PROTHROMBIN TIME: 18.6 s — AB (ref 11.6–15.2)

## 2015-06-13 MED ORDER — WARFARIN SODIUM 5 MG PO TABS
5.0000 mg | ORAL_TABLET | Freq: Every day | ORAL | Status: DC
Start: 2015-06-13 — End: 2015-08-14

## 2015-06-13 NOTE — Patient Instructions (Signed)
Restart Coumadin at 5 mg daily and return in 4 weeks for ProTime only.

## 2015-06-13 NOTE — Progress Notes (Signed)
   Subjective:    Patient ID: Lauren Burns, female    DOB: 06/03/1975, 40 y.o.   MRN: [id number]  HPI She missed last appointment due to death in family. However she was running out of Coumadin 5 mg daily and for the past couple days has only taking 3 mg daily. Apparently she has 1 mg tablets from an old prescription when she was taken up to 6 mg daily. We did not know this prior to drawing her pro time today. We are trying to see her on a every 6 week basis.    Review of Systems     Objective:   Physical Exam  Not examined. Spent 10  minutes speaking with her about these issues. No problems with Coumadin. No bleeding issues. ProTime INR subtherapeutic at 1.51      Assessment & Plan:  Antiphospholipid antibody syndrome  Chronic anticoagulation-subtherapeutic INR at 1.51. Has not been taking correct dose of Coumadin for the past couple of days  Plan: Reorder Coumadin 5 mg daily and return in 4 weeks for ProTime INR without office visit. Then we will try to get her back on a every 6 week appointment schedule.

## 2015-07-09 ENCOUNTER — Other Ambulatory Visit: Payer: BLUE CROSS/BLUE SHIELD | Admitting: Internal Medicine

## 2015-07-11 ENCOUNTER — Other Ambulatory Visit: Payer: Self-pay

## 2015-07-11 ENCOUNTER — Other Ambulatory Visit: Payer: BLUE CROSS/BLUE SHIELD | Admitting: Internal Medicine

## 2015-07-11 DIAGNOSIS — Z7901 Long term (current) use of anticoagulants: Secondary | ICD-10-CM | POA: Diagnosis not present

## 2015-07-11 LAB — PROTIME-INR
INR: 2.08 — ABNORMAL HIGH (ref <1.50)
Prothrombin Time: 23.9 seconds — ABNORMAL HIGH (ref 11.6–15.2)

## 2015-07-11 MED ORDER — DULOXETINE HCL 60 MG PO CPEP: 60.0000 mg | ORAL_CAPSULE | Freq: Every day | ORAL | Status: DC

## 2015-08-14 ENCOUNTER — Other Ambulatory Visit: Payer: Self-pay

## 2015-08-14 MED ORDER — WARFARIN SODIUM 5 MG PO TABS: 5.0000 mg | ORAL_TABLET | Freq: Every day | ORAL | Status: DC

## 2015-09-27 ENCOUNTER — Other Ambulatory Visit: Payer: BLUE CROSS/BLUE SHIELD | Admitting: Internal Medicine

## 2015-09-27 ENCOUNTER — Ambulatory Visit: Payer: BLUE CROSS/BLUE SHIELD | Admitting: Internal Medicine

## 2015-10-10 ENCOUNTER — Other Ambulatory Visit: Payer: BLUE CROSS/BLUE SHIELD | Admitting: Internal Medicine

## 2015-10-10 ENCOUNTER — Encounter: Payer: Self-pay | Admitting: Internal Medicine

## 2015-10-10 ENCOUNTER — Ambulatory Visit (INDEPENDENT_AMBULATORY_CARE_PROVIDER_SITE_OTHER): Payer: BLUE CROSS/BLUE SHIELD | Admitting: Internal Medicine

## 2015-10-10 VITALS — BP 118/88 | HR 82 | Temp 98.3°F | Resp 16 | Wt 308.0 lb

## 2015-10-10 DIAGNOSIS — Z7901 Long term (current) use of anticoagulants: Secondary | ICD-10-CM

## 2015-10-10 DIAGNOSIS — D6861 Antiphospholipid syndrome: Secondary | ICD-10-CM

## 2015-10-10 LAB — PROTIME-INR
INR: 4 — ABNORMAL HIGH
Prothrombin Time: 39.8 s — ABNORMAL HIGH (ref 9.0–11.5)

## 2015-10-10 NOTE — Patient Instructions (Signed)
Hold Coumadin for 2 days and then restart at same dose 5 mg daily. Return in 2 weeks for ProTime. Had physical exam in the near future.

## 2015-10-10 NOTE — Progress Notes (Signed)
   Subjective:    Patient ID: Lauren Burns, female    DOB: 06/21/1975, 40 y.o.   MRN: [id number]  HPI 40 year old female with history of antiphospholipid antibody syndrome requiring chronic anticoagulation with Coumadin. She is on Coumadin 5 mg daily. Denies taking extra Coumadin over the past few days. ProTime INR is 4 with normal being between 2 and 3. We last checked her ProTime INR in mid April and value was within normal limits at 2.08. Previously in March it had been low at 1.51 and before that in December was 2.38.  Patient has been under some stress. Husband was involved in a scooter accident and had to have vascular surgery by Dr. Arbie CookeyEarly to repair a femoral artery.  Her weight now is excessive at 308 pounds. She was 268 pounds in July 2016.  Review of Systems as above. No excessive bruising or abnormal bleeding     Objective:   Physical Exam  Not examined. Spent 15 minutes speaking with patient about these issues      Assessment & Plan:  Antiphospholipid antibody syndrome requiring chronic anticoagulation  Morbid obesity  Plan: Continue diet and exercise efforts. Home Coumadin for 2 days and restart at 5 mg daily. Follow-up in 2 weeks without office visit.  Needs to have physical exam in the near future since she is now 40 years old.

## 2015-10-17 ENCOUNTER — Other Ambulatory Visit: Payer: Self-pay

## 2015-10-17 MED ORDER — WARFARIN SODIUM 5 MG PO TABS: 5.0000 mg | ORAL_TABLET | Freq: Every day | ORAL | Status: DC

## 2015-10-21 NOTE — Progress Notes (Signed)
Erroneous encounter

## 2015-10-29 ENCOUNTER — Other Ambulatory Visit: Payer: BLUE CROSS/BLUE SHIELD | Admitting: Internal Medicine

## 2015-10-29 ENCOUNTER — Telehealth: Payer: Self-pay | Admitting: Internal Medicine

## 2015-10-29 DIAGNOSIS — Z7901 Long term (current) use of anticoagulants: Secondary | ICD-10-CM | POA: Diagnosis not present

## 2015-10-29 DIAGNOSIS — E785 Hyperlipidemia, unspecified: Secondary | ICD-10-CM | POA: Diagnosis not present

## 2015-10-29 LAB — PROTIME-INR
INR: 3.3 — AB
Prothrombin Time: 32.9 s — ABNORMAL HIGH (ref 9.0–11.5)

## 2015-10-29 NOTE — Telephone Encounter (Signed)
Spoke with patient; advised per Dr. Lenord Fellers she is to take 4.5mg  daily of Coumadin for the next 2 weeks.  Then, return to office for STAT PT/INR and office visit.  Will call 4mg  and 1 mg tablets to her pharmacy.    Pharmacy:  [facility] @ [place]  Left voicemail for pharmacy at [phone] for  4mg  Warfarin; dispense #30, no refill - take 1 daily 1mg  Warfarin; dispense #30, no refill - take 1/2 tablet daily  Appointment given for 8/17 at 9:00 for STAT labs 8/17 at 4:30 for office visit with Dr. Lenord Fellers for follow-up  Patient verbalized understanding of these instructions and follow up appointment information.

## 2015-10-29 NOTE — Addendum Note (Signed)
Addended by: Doree Barthel on: 10/29/2015 09:49 AM   Modules accepted: Orders

## 2015-11-14 ENCOUNTER — Other Ambulatory Visit: Payer: BLUE CROSS/BLUE SHIELD | Admitting: Internal Medicine

## 2015-11-14 ENCOUNTER — Ambulatory Visit (INDEPENDENT_AMBULATORY_CARE_PROVIDER_SITE_OTHER): Payer: BLUE CROSS/BLUE SHIELD | Admitting: Internal Medicine

## 2015-11-14 VITALS — BP 120/82 | HR 101 | Temp 98.4°F | Ht 69.0 in | Wt 327.0 lb

## 2015-11-14 DIAGNOSIS — R309 Painful micturition, unspecified: Secondary | ICD-10-CM | POA: Diagnosis not present

## 2015-11-14 DIAGNOSIS — N390 Urinary tract infection, site not specified: Secondary | ICD-10-CM

## 2015-11-14 DIAGNOSIS — D6861 Antiphospholipid syndrome: Secondary | ICD-10-CM

## 2015-11-14 DIAGNOSIS — Z7901 Long term (current) use of anticoagulants: Secondary | ICD-10-CM

## 2015-11-14 DIAGNOSIS — Z86718 Personal history of other venous thrombosis and embolism: Secondary | ICD-10-CM | POA: Diagnosis not present

## 2015-11-14 DIAGNOSIS — R8290 Unspecified abnormal findings in urine: Secondary | ICD-10-CM

## 2015-11-14 LAB — PROTIME-INR
INR: 2.9 — ABNORMAL HIGH
Prothrombin Time: 28.6 s — ABNORMAL HIGH (ref 9.0–11.5)

## 2015-11-14 LAB — POCT URINALYSIS DIPSTICK
Bilirubin, UA: NEGATIVE
Glucose, UA: NEGATIVE
KETONES UA: NEGATIVE
Nitrite, UA: NEGATIVE
PROTEIN UA: NEGATIVE
Spec Grav, UA: 1.02
UROBILINOGEN UA: 0.2
pH, UA: 5

## 2015-11-14 MED ORDER — CIPROFLOXACIN HCL 500 MG PO TABS: 500.0000 mg | ORAL_TABLET | Freq: Two times a day (BID) | ORAL | 0 refills | Status: DC

## 2015-11-14 NOTE — Progress Notes (Signed)
   Subjective:    Patient ID: Lauren Burns, female    DOB: 05/15/1975, 40 y.o.   MRN: [id number]  HPI 3 week history of low back pain and abdominal cramping. No menses since May. Had Mirena removed in April. To see GYN August 30th. No dysuria and a couple of episodes of nocturia. Intermittent nausea. Was here today for anticoag recheck but complained also of abdominal pain and back pain. Thinks she may have a UTI.  History of antiphospholipid antibody syndrome and is on chronic anticoagulation with Coumadin 4.5 mg daily. We are trying to keep her INR between 2 and 3. See above    Review of Systems     Objective:   Physical Exam Abdomen is obese soft nondistended with some mild tenderness through the lower abdomen. No rebound tenderness is appreciated. Urinalysis is abnormal. She is afebrile.       Assessment & Plan:  Antiphospholipid antibody syndrome on chronic anticoagulation. Current dose of Coumadin 4.5 mg daily. ProTime INR is acceptable at 2.9. Return in 6 weeks.  Abdominal pain-has GYN exam in the near future  Abnormal urinalysis-urine culture pending. May well have a UTI. Cipro 500 mg twice daily for 10 days.

## 2015-11-15 ENCOUNTER — Encounter: Payer: Self-pay | Admitting: Internal Medicine

## 2015-11-15 NOTE — Patient Instructions (Signed)
Continue Coumadin 4.5 mg daily. Return in 6 weeks for ProTime INR. Urine culture pending. Start Cipro 500 mg twice daily for 10 days. Keep GYN appointment late August.

## 2015-11-16 LAB — URINE CULTURE: Organism ID, Bacteria: 10000

## 2015-11-27 DIAGNOSIS — N809 Endometriosis, unspecified: Secondary | ICD-10-CM | POA: Diagnosis not present

## 2015-11-27 DIAGNOSIS — R102 Pelvic and perineal pain: Secondary | ICD-10-CM | POA: Diagnosis not present

## 2015-12-10 DIAGNOSIS — R635 Abnormal weight gain: Secondary | ICD-10-CM | POA: Diagnosis not present

## 2015-12-10 DIAGNOSIS — K625 Hemorrhage of anus and rectum: Secondary | ICD-10-CM | POA: Diagnosis not present

## 2015-12-10 DIAGNOSIS — R194 Change in bowel habit: Secondary | ICD-10-CM | POA: Diagnosis not present

## 2015-12-10 DIAGNOSIS — R102 Pelvic and perineal pain: Secondary | ICD-10-CM | POA: Diagnosis not present

## 2015-12-26 ENCOUNTER — Ambulatory Visit: Payer: BLUE CROSS/BLUE SHIELD | Admitting: Internal Medicine

## 2015-12-26 ENCOUNTER — Other Ambulatory Visit: Payer: BLUE CROSS/BLUE SHIELD | Admitting: Internal Medicine

## 2015-12-30 DIAGNOSIS — R102 Pelvic and perineal pain: Secondary | ICD-10-CM | POA: Diagnosis not present

## 2015-12-31 ENCOUNTER — Ambulatory Visit (INDEPENDENT_AMBULATORY_CARE_PROVIDER_SITE_OTHER): Payer: BLUE CROSS/BLUE SHIELD | Admitting: Internal Medicine

## 2015-12-31 ENCOUNTER — Other Ambulatory Visit (INDEPENDENT_AMBULATORY_CARE_PROVIDER_SITE_OTHER): Payer: BLUE CROSS/BLUE SHIELD | Admitting: Internal Medicine

## 2015-12-31 ENCOUNTER — Encounter: Payer: Self-pay | Admitting: Internal Medicine

## 2015-12-31 VITALS — BP 124/86 | HR 86 | Temp 97.7°F | Wt 329.0 lb

## 2015-12-31 DIAGNOSIS — Z7901 Long term (current) use of anticoagulants: Secondary | ICD-10-CM

## 2015-12-31 DIAGNOSIS — D6861 Antiphospholipid syndrome: Secondary | ICD-10-CM | POA: Diagnosis not present

## 2015-12-31 LAB — PROTIME-INR
INR: 1.5 — AB
PROTHROMBIN TIME: 14.8 s — AB (ref 9.0–11.5)

## 2015-12-31 MED ORDER — WARFARIN SODIUM 1 MG PO TABS: 1.0000 mg | ORAL_TABLET | Freq: Every day | ORAL | 0 refills | Status: DC

## 2015-12-31 MED ORDER — WARFARIN SODIUM 4 MG PO TABS: 4.0000 mg | ORAL_TABLET | Freq: Every day | ORAL | 0 refills | Status: DC

## 2016-01-02 ENCOUNTER — Other Ambulatory Visit: Payer: BLUE CROSS/BLUE SHIELD | Admitting: Internal Medicine

## 2016-01-02 ENCOUNTER — Ambulatory Visit: Payer: BLUE CROSS/BLUE SHIELD | Admitting: Internal Medicine

## 2016-01-16 ENCOUNTER — Other Ambulatory Visit: Payer: BLUE CROSS/BLUE SHIELD | Admitting: Internal Medicine

## 2016-01-16 ENCOUNTER — Telehealth: Payer: Self-pay | Admitting: Internal Medicine

## 2016-01-16 DIAGNOSIS — Z86718 Personal history of other venous thrombosis and embolism: Secondary | ICD-10-CM

## 2016-01-16 LAB — PROTIME-INR
INR: 2.6 — ABNORMAL HIGH
Prothrombin Time: 26.3 s — ABNORMAL HIGH (ref 9.0–11.5)

## 2016-01-16 NOTE — Telephone Encounter (Signed)
PT 26.3 INR 2.6  Left message for patient to continue same dose of Coumadin.  Patient to return to the office in 6 weeks for STAT PT/INR on 11/30 at 9:00 and to see Dr. Lenord FellersBaxley at 4:30 for OV.  Patient instructed on message to call the office if she has any questions.

## 2016-01-17 NOTE — Progress Notes (Signed)
   Subjective:    Patient ID: Lauren Burns, female    DOB: 04/29/1975, 40 y.o.   MRN: [id number]  HPI Patient with history of antiphospholipid antibody syndrome on chronic Coumadin anticoagulation. Here today for ProTime INR and follow-up. Unfortunately ran out of Coumadin 1 mg tablets recently. Is supposed to be on Coumadin 4.5 mg daily. ProTime INR in August was 2.9 on that dose. We did not know she had run out of 1 mg Coumadin tablets prior to drawing her pro time today. ProTime INR today is 1.5.    Review of Systems     Objective:   Physical Exam  Not examined      Assessment & Plan:  Chronic anticoagulation subtherapeutic at present because she ran out of 1 mg tablets and has not been taking correct dosage  Plan: Patient reminded call for refills if she is running out of Coumadin. She will restart 4.5 mg daily and return October 19 for follow-up ProTime INR without office visit.  Patient indicates she's been having some issues with elevated blood pressure. Needs to monitor blood pressure and let me know what these readings are. She is markedly overweight. Blood pressure today here is acceptable at 124/86.

## 2016-01-17 NOTE — Patient Instructions (Signed)
Restart Coumadin 4.5 mg daily and follow-up October 19. Monitor blood pressure at home. Encouraged diet exercise and weight loss.

## 2016-01-23 ENCOUNTER — Other Ambulatory Visit: Payer: Self-pay | Admitting: *Deleted

## 2016-01-23 MED ORDER — DULOXETINE HCL 60 MG PO CPEP: 60.0000 mg | ORAL_CAPSULE | Freq: Every day | ORAL | 3 refills | Status: DC

## 2016-02-07 ENCOUNTER — Other Ambulatory Visit: Payer: Self-pay

## 2016-02-07 MED ORDER — WARFARIN SODIUM 1 MG PO TABS: 1.0000 mg | ORAL_TABLET | Freq: Every day | ORAL | 0 refills | Status: DC

## 2016-02-27 ENCOUNTER — Ambulatory Visit: Payer: BLUE CROSS/BLUE SHIELD | Admitting: Internal Medicine

## 2016-02-27 ENCOUNTER — Other Ambulatory Visit: Payer: BLUE CROSS/BLUE SHIELD | Admitting: Internal Medicine

## 2016-03-03 ENCOUNTER — Encounter: Payer: Self-pay | Admitting: Internal Medicine

## 2016-03-03 ENCOUNTER — Ambulatory Visit (INDEPENDENT_AMBULATORY_CARE_PROVIDER_SITE_OTHER): Payer: BLUE CROSS/BLUE SHIELD | Admitting: Internal Medicine

## 2016-03-03 ENCOUNTER — Other Ambulatory Visit: Payer: BLUE CROSS/BLUE SHIELD | Admitting: Internal Medicine

## 2016-03-03 VITALS — BP 138/98 | HR 84 | Temp 98.1°F | Ht 69.0 in | Wt 284.0 lb

## 2016-03-03 DIAGNOSIS — Z7901 Long term (current) use of anticoagulants: Secondary | ICD-10-CM | POA: Diagnosis not present

## 2016-03-03 DIAGNOSIS — D6861 Antiphospholipid syndrome: Secondary | ICD-10-CM | POA: Diagnosis not present

## 2016-03-03 DIAGNOSIS — R030 Elevated blood-pressure reading, without diagnosis of hypertension: Secondary | ICD-10-CM

## 2016-03-03 LAB — PROTIME-INR
INR: 2.2 — AB
PROTHROMBIN TIME: 22.7 s — AB (ref 9.0–11.5)

## 2016-03-03 MED ORDER — AZITHROMYCIN 250 MG PO TABS: ORAL_TABLET | ORAL | 0 refills | Status: DC

## 2016-03-03 MED ORDER — WARFARIN SODIUM 4 MG PO TABS: 4.0000 mg | ORAL_TABLET | Freq: Every day | ORAL | 0 refills | Status: DC

## 2016-03-10 ENCOUNTER — Other Ambulatory Visit: Payer: Self-pay | Admitting: Internal Medicine

## 2016-03-29 NOTE — Patient Instructions (Signed)
Continue same dose of Coumadin. Physical exam scheduled for February. Monitor blood pressure between now and then.

## 2016-03-29 NOTE — Progress Notes (Signed)
   Subjective:    Patient ID: Lauren Burns, female    DOB: 04/27/1975, 40 y.o.   MRN: [id number]  HPI blood pressure is elevated once again today. She needs to monitor at home. Prescription written for home blood pressure monitor. Has put on a great deal of weight in the past few years. Rare well may have developed hypertension.  Here today for follow-up on chronic anticoagulation with antiphospholipid antibody syndrome. Is on chronic Coumadin therapy. Para ProTime INR is 2.2 in 2 months ago was 2.6    Review of Systems     Objective:   Physical Exam        Assessment & Plan:

## 2016-04-14 ENCOUNTER — Other Ambulatory Visit: Payer: BLUE CROSS/BLUE SHIELD | Admitting: Internal Medicine

## 2016-04-14 ENCOUNTER — Other Ambulatory Visit: Payer: Self-pay | Admitting: Internal Medicine

## 2016-04-14 DIAGNOSIS — D6861 Antiphospholipid syndrome: Secondary | ICD-10-CM

## 2016-04-14 NOTE — Progress Notes (Signed)
Labs only

## 2016-04-15 LAB — PROTIME-INR
INR: 2.2 — AB
PROTHROMBIN TIME: 22.2 s — AB (ref 9.0–11.5)

## 2016-04-21 ENCOUNTER — Other Ambulatory Visit: Payer: BLUE CROSS/BLUE SHIELD | Admitting: Internal Medicine

## 2016-05-18 ENCOUNTER — Other Ambulatory Visit: Payer: Self-pay | Admitting: Internal Medicine

## 2016-05-21 ENCOUNTER — Other Ambulatory Visit: Payer: BLUE CROSS/BLUE SHIELD | Admitting: Internal Medicine

## 2016-05-21 DIAGNOSIS — Z13220 Encounter for screening for lipoid disorders: Secondary | ICD-10-CM | POA: Diagnosis not present

## 2016-05-21 DIAGNOSIS — Z0000 Encounter for general adult medical examination without abnormal findings: Secondary | ICD-10-CM | POA: Diagnosis not present

## 2016-05-21 DIAGNOSIS — E785 Hyperlipidemia, unspecified: Secondary | ICD-10-CM | POA: Diagnosis not present

## 2016-05-21 DIAGNOSIS — Z1321 Encounter for screening for nutritional disorder: Secondary | ICD-10-CM | POA: Diagnosis not present

## 2016-05-21 DIAGNOSIS — Z1329 Encounter for screening for other suspected endocrine disorder: Secondary | ICD-10-CM

## 2016-05-21 DIAGNOSIS — Z7901 Long term (current) use of anticoagulants: Secondary | ICD-10-CM

## 2016-05-21 LAB — TSH: TSH: 2.06 mIU/L

## 2016-05-21 LAB — CBC WITH DIFFERENTIAL/PLATELET
BASOS ABS: 0 {cells}/uL (ref 0–200)
BASOS PCT: 0 %
EOS ABS: 280 {cells}/uL (ref 15–500)
Eosinophils Relative: 5 %
HEMATOCRIT: 42 % (ref 35.0–45.0)
Hemoglobin: 13.6 g/dL (ref 11.7–15.5)
LYMPHS PCT: 31 %
Lymphs Abs: 1736 cells/uL (ref 850–3900)
MCH: 28.8 pg (ref 27.0–33.0)
MCHC: 32.4 g/dL (ref 32.0–36.0)
MCV: 89 fL (ref 80.0–100.0)
MONO ABS: 336 {cells}/uL (ref 200–950)
MONOS PCT: 6 %
MPV: 9.1 fL (ref 7.5–12.5)
NEUTROS PCT: 58 %
Neutro Abs: 3248 cells/uL (ref 1500–7800)
PLATELETS: 263 10*3/uL (ref 140–400)
RBC: 4.72 MIL/uL (ref 3.80–5.10)
RDW: 13.5 % (ref 11.0–15.0)
WBC: 5.6 10*3/uL (ref 3.8–10.8)

## 2016-05-21 LAB — LIPID PANEL
CHOL/HDL RATIO: 3.9 ratio (ref <5.0)
Cholesterol: 176 mg/dL (ref <200)
HDL: 45 mg/dL — ABNORMAL LOW (ref >50)
LDL CALC: 118 mg/dL — AB (ref <100)
TRIGLYCERIDES: 66 mg/dL (ref <150)
VLDL: 13 mg/dL (ref <30)

## 2016-05-21 LAB — COMPREHENSIVE METABOLIC PANEL
ALBUMIN: 3.9 g/dL (ref 3.6–5.1)
ALT: 13 U/L (ref 6–29)
AST: 19 U/L (ref 10–30)
Alkaline Phosphatase: 79 U/L (ref 33–115)
BUN: 11 mg/dL (ref 7–25)
CHLORIDE: 102 mmol/L (ref 98–110)
CO2: 25 mmol/L (ref 20–31)
Calcium: 8.8 mg/dL (ref 8.6–10.2)
Creat: 0.8 mg/dL (ref 0.50–1.10)
Glucose, Bld: 97 mg/dL (ref 65–99)
POTASSIUM: 4.6 mmol/L (ref 3.5–5.3)
Sodium: 138 mmol/L (ref 135–146)
Total Bilirubin: 0.6 mg/dL (ref 0.2–1.2)
Total Protein: 6.9 g/dL (ref 6.1–8.1)

## 2016-05-21 NOTE — Addendum Note (Signed)
Addended by: Dixie DialsVALENCIA, Inza Mikrut on: 05/21/2016 09:17 AM   Modules accepted: Orders

## 2016-05-22 LAB — PROTIME-INR
INR: 1.6 — ABNORMAL HIGH
Prothrombin Time: 16.2 s — ABNORMAL HIGH (ref 9.0–11.5)

## 2016-05-22 LAB — VITAMIN D 25 HYDROXY (VIT D DEFICIENCY, FRACTURES): Vit D, 25-Hydroxy: 25 ng/mL — ABNORMAL LOW (ref 30–100)

## 2016-05-22 NOTE — Progress Notes (Signed)
Protime INR is low. Will call pt and see if dose has been missed. Pt says may have missed one dose. Will recheck this coming Tuesday.

## 2016-05-26 ENCOUNTER — Ambulatory Visit (INDEPENDENT_AMBULATORY_CARE_PROVIDER_SITE_OTHER): Payer: BLUE CROSS/BLUE SHIELD | Admitting: Internal Medicine

## 2016-05-26 ENCOUNTER — Encounter: Payer: Self-pay | Admitting: Internal Medicine

## 2016-05-26 VITALS — BP 140/98 | HR 63 | Temp 98.0°F | Ht 68.0 in | Wt 302.0 lb

## 2016-05-26 DIAGNOSIS — Z0000 Encounter for general adult medical examination without abnormal findings: Secondary | ICD-10-CM | POA: Diagnosis not present

## 2016-05-26 DIAGNOSIS — D6861 Antiphospholipid syndrome: Secondary | ICD-10-CM

## 2016-05-26 DIAGNOSIS — Z8659 Personal history of other mental and behavioral disorders: Secondary | ICD-10-CM | POA: Diagnosis not present

## 2016-05-26 DIAGNOSIS — R030 Elevated blood-pressure reading, without diagnosis of hypertension: Secondary | ICD-10-CM | POA: Diagnosis not present

## 2016-05-26 DIAGNOSIS — Z7901 Long term (current) use of anticoagulants: Secondary | ICD-10-CM

## 2016-05-26 DIAGNOSIS — Q210 Ventricular septal defect: Secondary | ICD-10-CM

## 2016-05-26 DIAGNOSIS — Z86711 Personal history of pulmonary embolism: Secondary | ICD-10-CM | POA: Diagnosis not present

## 2016-05-26 DIAGNOSIS — R8290 Unspecified abnormal findings in urine: Secondary | ICD-10-CM | POA: Diagnosis not present

## 2016-05-26 LAB — PROTIME-INR
INR: 2.5 — ABNORMAL HIGH
PROTHROMBIN TIME: 25.5 s — AB (ref 9.0–11.5)

## 2016-05-26 LAB — POCT URINALYSIS DIPSTICK
BILIRUBIN UA: NEGATIVE
Glucose, UA: NEGATIVE
Ketones, UA: NEGATIVE
NITRITE UA: NEGATIVE
Protein, UA: NEGATIVE
Spec Grav, UA: >=1.03
Urobilinogen, UA: NEGATIVE
pH, UA: 6.5

## 2016-05-26 MED ORDER — LOSARTAN POTASSIUM 50 MG PO TABS: 50.0000 mg | ORAL_TABLET | Freq: Every day | ORAL | 3 refills | Status: DC

## 2016-05-26 MED ORDER — NALTREXONE-BUPROPION HCL ER 8-90 MG PO TB12: ORAL_TABLET | ORAL | 1 refills | Status: DC

## 2016-05-26 MED ORDER — DULOXETINE HCL 30 MG PO CPEP: 30.0000 mg | ORAL_CAPSULE | Freq: Every day | ORAL | 3 refills | Status: DC

## 2016-05-26 NOTE — Progress Notes (Signed)
Subjective:    Patient ID: Lauren Burns, female    DOB: 04/16/1975, 41 y.o.   MRN: [id number]  HPI 41 year old White Female with history of antiphospholipid antibody requiring chronic anticoagulation with Coumadin. History of pulmonary embolism secondary to antiphospholipid antibody. History of pregnancy-induced hypertension. She currently is on 4.5 mg Coumadin. PT today pending.  BP elevated at 140/98 and has been elevated past few times at office. Has running like this at home also.  In 2012 we documented her weight loss efforts because she was considering LAP-BAND surgery.  She's been on Cymbalta 60 mg daily for depression for some time.  She tried the [employer] diet in the past but knew that that was not very healthy.  In July 2011 she weighed 261 pounds and her height was 67 1/2 inches. In January 2009 she was 222 pounds. In October 2008 she was 229 pounds. In August 2008 she was 243 pounds. In May 2007 she was 268 pounds. In October 2006 she was 256 pounds. In August 2006 she was 247 1/2 pounds.  In 1997 she weighed 160 pounds. After that she was not seen here until approximately 2006.  With regard to her BSD, she had seen pediatric cardiologist in the past who felt that VSD could simply be watched. He did recommend SBE prophylaxis. He described it as a small perimembranous VSD.  She had pulmonary embolism December 2005. Had laparoscopic surgery for endometriosis in 2004. Pregnancy December 2004. Has had IUD for contraception. GYN is [facility]-Dr. Seymour BarsLaVoie.  History of pneumonia December 2005. Had vaginal delivery with pregnancy-induced hypertension December 2004. Fractured right radial head 1995. History of irritable bowel syndrome for which Levsin has been prescribed in the past.   Hx VSD.   Family Hx: Hip replacement and hx prostate CA in father. Brother has diabetes. Mother has antiphospholipid antibody syndrome, fibromyalgia syndrome, heart murmur, obesity.  Social  history: Married with 1 son. 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 It's busy and stressful. She gets up at 5 AM and works all day until 6 PM. She cooks supper for her family. Says she tries to get enough sleep and goes to bed by 10 PM. Does not smoke. Doesn't get much exercise. Has tried dieting in the past but not recently. Might be interested in gastric bypass surgery. We discussed Contrave and she is willing to try that  Labs reviewed. TSH  normal.  Review of Systems     Objective:   Physical Exam  Constitutional: She is oriented to person, place, and time. She appears well-developed and well-nourished. No distress.  HENT:  Head: Normocephalic and atraumatic.  Right Ear: External ear normal.  Left Ear: External ear normal.  Eyes: Conjunctivae and EOM are normal. Right eye exhibits no discharge. Left eye exhibits no discharge.  Neck: Neck supple. No JVD present. No thyromegaly present.  Cardiovascular: Normal rate, regular rhythm, normal heart sounds and intact distal pulses.   No murmur heard. Pulmonary/Chest: Effort normal and breath sounds normal. She has no wheezes.  Breasts normal female  Abdominal: Soft. Bowel sounds are normal. She exhibits no distension and no mass. There is no tenderness. There is no rebound and no guarding.  Genitourinary:  Genitourinary Comments: Deferred to GYN  Musculoskeletal: She exhibits no edema.  Neurological: She is alert and oriented to person, place, and time. She has normal reflexes. No cranial nerve deficit. Coordination normal.  Skin: Skin is warm and dry. No rash noted.  She is not diaphoretic.  Psychiatric: She has a normal mood and affect. Her behavior is normal.          Assessment & Plan:  Morbid obesity. Decrease Cymbalta from 60-30 mg daily. Trial of Contrave and follow-up in 4 weeks.  Elevated blood pressure-likely has essential hypertension. Begin losartan 50 mg daily and follow-up in 4 weeks.  Anxiety  depression  History of antiphospholipid antibody syndrome  History of PE related to antiphospholipid antibody  History of pregnancy-induced hypertension  VSD  Chronic anticoagulation due to antiphospholipid antibody syndrome.  Plan trial of Contrave, decrease Cymbalta to 30 mg daily. Losartan 50 mg daily. Follow-up in 4 weeks. Continue same dose of Coumadin.

## 2016-05-27 LAB — URINE CULTURE: Organism ID, Bacteria: NO GROWTH

## 2016-05-27 NOTE — Patient Instructions (Signed)
Continue same dose of Coumadin. Start losartan 50 mg daily. Decrease Cymbalta to 30 mg daily. Trial of Contrave. Return in 4 weeks.

## 2016-06-23 ENCOUNTER — Other Ambulatory Visit: Payer: BLUE CROSS/BLUE SHIELD | Admitting: Internal Medicine

## 2016-06-23 DIAGNOSIS — Z7901 Long term (current) use of anticoagulants: Secondary | ICD-10-CM

## 2016-06-23 NOTE — Progress Notes (Signed)
ProTime INR collected

## 2016-06-25 ENCOUNTER — Telehealth: Payer: Self-pay

## 2016-06-25 ENCOUNTER — Ambulatory Visit: Payer: BLUE CROSS/BLUE SHIELD | Admitting: Internal Medicine

## 2016-06-25 ENCOUNTER — Other Ambulatory Visit: Payer: BLUE CROSS/BLUE SHIELD | Admitting: Internal Medicine

## 2016-06-25 MED ORDER — WARFARIN SODIUM 4 MG PO TABS: 4.0000 mg | ORAL_TABLET | Freq: Every day | ORAL | 0 refills | Status: DC

## 2016-06-25 MED ORDER — WARFARIN SODIUM 1 MG PO TABS
1.0000 mg | ORAL_TABLET | Freq: Every day | ORAL | 0 refills | Status: DC
Start: 2016-06-25 — End: 2016-12-17

## 2016-06-25 NOTE — Telephone Encounter (Signed)
Pt was notified.  

## 2016-06-25 NOTE — Telephone Encounter (Signed)
ESCRIBED

## 2016-06-25 NOTE — Telephone Encounter (Signed)
Please refill x one month

## 2016-06-25 NOTE — Telephone Encounter (Addendum)
Patient called to reschedule her lab and follow up appt  From today to 07/03/16. She said she [facility] a [facility] and they haven't heard back from us, she's completely out of her COUMADIN. Ok to refill?

## 2016-07-03 ENCOUNTER — Other Ambulatory Visit: Payer: Self-pay | Admitting: Internal Medicine

## 2016-07-03 ENCOUNTER — Ambulatory Visit: Payer: Self-pay | Admitting: Internal Medicine

## 2016-07-07 ENCOUNTER — Other Ambulatory Visit: Payer: BLUE CROSS/BLUE SHIELD | Admitting: Internal Medicine

## 2016-07-07 ENCOUNTER — Ambulatory Visit (INDEPENDENT_AMBULATORY_CARE_PROVIDER_SITE_OTHER): Payer: BLUE CROSS/BLUE SHIELD | Admitting: Internal Medicine

## 2016-07-07 ENCOUNTER — Encounter: Payer: Self-pay | Admitting: Internal Medicine

## 2016-07-07 VITALS — BP 140/100 | HR 91 | Temp 97.6°F | Ht 68.0 in | Wt 333.0 lb

## 2016-07-07 DIAGNOSIS — Z7901 Long term (current) use of anticoagulants: Secondary | ICD-10-CM | POA: Diagnosis not present

## 2016-07-07 DIAGNOSIS — I10 Essential (primary) hypertension: Secondary | ICD-10-CM

## 2016-07-07 LAB — PROTIME-INR
INR: 2.6 — ABNORMAL HIGH
PROTHROMBIN TIME: 26.2 s — AB (ref 9.0–11.5)

## 2016-07-07 MED ORDER — BUPROPION HCL ER (XL) 150 MG PO TB24: 150.0000 mg | ORAL_TABLET | Freq: Every day | ORAL | 0 refills | Status: DC

## 2016-07-07 MED ORDER — DIETHYLPROPION HCL ER 75 MG PO TB24: ORAL_TABLET | ORAL | 0 refills | Status: DC

## 2016-07-07 MED ORDER — LOSARTAN POTASSIUM-HCTZ 100-25 MG PO TABS: 1.0000 | ORAL_TABLET | Freq: Every day | ORAL | 3 refills | Status: DC

## 2016-07-07 NOTE — Addendum Note (Signed)
Addended by: Dixie Dials on: 07/07/2016 09:16 AM   Modules accepted: Orders

## 2016-07-07 NOTE — Progress Notes (Signed)
   Subjective:    Patient ID: [name], female    DOB: [birth year], 41 y.o.   MRN: [id number]  HPI For follow-up on chronic anticoagulation. History of antiphospholipid antibody syndrome requiring chronic anticoagulation with Coumadin. Is on 4.5 mg Coumadin daily.  Also has developed morbid obesity. Recommended Dr. Dalbert Garnet to her. Could not get Contrave approved by her insurance company for weight loss. We may try Tenuate 75 mg at 10 AM. Needs dietary counseling. Schedule is very busy. She works long hours at [employer] and then comes home and cook supper. Chronically fatigued. ProTime INR is stable at 2.6 on Coumadin 4.5 mg daily.  Blood pressure is elevated 140/100    Review of Systems see above     Objective:   Physical Exam  Chest clear. Cardiac exam regular rate and rhythm. Extremities without pitting edema      Assessment & Plan:  Essential hypertension-was started on losartan 50 mg daily in February.  Antiphospholipid antibody syndrome requiring chronic anticoagulation was stable ProTime INR  Morbid obesity  History of depression-in February at time of physical exam Cymbalta was decreased from 60 to 30 mg daily.  Plan: Losartan HCTZ 100/25 daily. Return in a couple of weeks for follow-up on blood pressure. She is on oral contraceptives. She is on Cymbalta 30 mg daily and Wellbutrin.

## 2016-07-13 ENCOUNTER — Telehealth: Payer: Self-pay | Admitting: Internal Medicine

## 2016-07-13 NOTE — Telephone Encounter (Signed)
[employer] called needing additional information faxed to them for the prior authorization for short term weight loss medication.  Lauren Burns had initiated a prior authorization for short term weight loss medications on Friday.  Passed the information along to Lauren Burns to process the paperwork.

## 2016-07-15 DIAGNOSIS — Z1231 Encounter for screening mammogram for malignant neoplasm of breast: Secondary | ICD-10-CM | POA: Diagnosis not present

## 2016-07-20 ENCOUNTER — Ambulatory Visit: Payer: BLUE CROSS/BLUE SHIELD | Admitting: Internal Medicine

## 2016-07-23 ENCOUNTER — Encounter: Payer: Self-pay | Admitting: Internal Medicine

## 2016-07-23 ENCOUNTER — Ambulatory Visit (INDEPENDENT_AMBULATORY_CARE_PROVIDER_SITE_OTHER): Payer: BLUE CROSS/BLUE SHIELD | Admitting: Internal Medicine

## 2016-07-23 VITALS — BP 130/86 | HR 75 | Temp 97.7°F | Ht 68.0 in | Wt 329.0 lb

## 2016-07-23 DIAGNOSIS — Z7901 Long term (current) use of anticoagulants: Secondary | ICD-10-CM | POA: Diagnosis not present

## 2016-07-23 DIAGNOSIS — Z6841 Body Mass Index (BMI) 40.0 and over, adult: Secondary | ICD-10-CM

## 2016-07-23 DIAGNOSIS — I10 Essential (primary) hypertension: Secondary | ICD-10-CM | POA: Diagnosis not present

## 2016-07-23 DIAGNOSIS — D6861 Antiphospholipid syndrome: Secondary | ICD-10-CM | POA: Diagnosis not present

## 2016-07-23 DIAGNOSIS — J301 Allergic rhinitis due to pollen: Secondary | ICD-10-CM

## 2016-07-23 DIAGNOSIS — Z8659 Personal history of other mental and behavioral disorders: Secondary | ICD-10-CM

## 2016-07-23 MED ORDER — METHYLPREDNISOLONE ACETATE 80 MG/ML IJ SUSP
80.0000 mg | Freq: Once | INTRAMUSCULAR | Status: AC
  Administered 2016-07-23: 80 mg via INTRAMUSCULAR

## 2016-07-23 MED ORDER — WARFARIN SODIUM 4 MG PO TABS: 4.0000 mg | ORAL_TABLET | Freq: Every day | ORAL | 0 refills | Status: DC

## 2016-07-23 MED ORDER — AMLODIPINE BESYLATE 5 MG PO TABS: 5.0000 mg | ORAL_TABLET | Freq: Every day | ORAL | 3 refills | Status: DC

## 2016-07-23 NOTE — Patient Instructions (Signed)
Depo-Medrol 80 mg IM for allergic rhinitis. Add Norvasc to losartan 100/25 daily and return in 3 weeks. Refill Coumadin 4 mg tablets.

## 2016-07-23 NOTE — Progress Notes (Signed)
   Subjective:    Patient ID: Lauren Burns, female    DOB: [birth year], 41 y.o.   MRN: [id number]  HPI 41 year old Female on losartan HCTZ for hypertension returns today for follow-up. She is on chronic anticoagulation with Coumadin for antiphospholipid antibody syndrome. She is obese. Suggested at last visit she contact Dr. Dalbert Garnet for consult but she has not done that yet.  His have an allergic rhinitis symptoms with runny nose recently. She says she cannot get out to walk because of allergy symptoms.    Review of Systems see above     Objective:   Physical Exam  Chest clear. Cardiac exam regular rate and rhythm. Extremities without edema. Has rhinorrhea due to allergies      Assessment & Plan:   Labile HTN-Add Norvasc 5 mg daily to losartan HCTZ 100/25 and return in 3 weeks  Chronic anticoagulation-Refill Coumadin 4 mg tablets. ProTime INR was 2.6 when checked on April 10. We usually check her pro time every 6 weeks  Allergic rhinitis-given Depo-Medrol 80 mg IM  Obesity-we were not able to get Tenuate approved for her but she paid for it out of pocket. She's not sure it's helping suppress her appetite. Encouraged her to contact Dr. Dalbert Garnet. Continue diet and exercise efforts.  Plan: Add Norvasc 5 mg daily to losartan HCTZ 100/25 daily. Return in 3 weeks. Depo-Medrol 80 mg IM for allergy symptoms.

## 2016-07-25 NOTE — Patient Instructions (Addendum)
Discontinue losartan 50 mg daily. Try losartan HCTZ 100/25 and return in 2 weeks. Try Tenuate Extentab 75 mg at 10 AM.

## 2016-09-27 DIAGNOSIS — S43422A Sprain of left rotator cuff capsule, initial encounter: Secondary | ICD-10-CM | POA: Diagnosis not present

## 2016-09-27 DIAGNOSIS — M25512 Pain in left shoulder: Secondary | ICD-10-CM | POA: Diagnosis not present

## 2016-11-10 ENCOUNTER — Encounter (INDEPENDENT_AMBULATORY_CARE_PROVIDER_SITE_OTHER): Payer: BLUE CROSS/BLUE SHIELD

## 2016-11-12 ENCOUNTER — Encounter (INDEPENDENT_AMBULATORY_CARE_PROVIDER_SITE_OTHER): Payer: Self-pay | Admitting: Family Medicine

## 2016-11-12 ENCOUNTER — Ambulatory Visit (INDEPENDENT_AMBULATORY_CARE_PROVIDER_SITE_OTHER): Payer: BLUE CROSS/BLUE SHIELD | Admitting: Family Medicine

## 2016-11-12 VITALS — BP 117/79 | HR 74 | Temp 97.8°F | Ht 68.0 in | Wt 320.0 lb

## 2016-11-12 DIAGNOSIS — Z6841 Body Mass Index (BMI) 40.0 and over, adult: Secondary | ICD-10-CM | POA: Diagnosis not present

## 2016-11-12 DIAGNOSIS — E559 Vitamin D deficiency, unspecified: Secondary | ICD-10-CM | POA: Diagnosis not present

## 2016-11-12 DIAGNOSIS — IMO0001 Reserved for inherently not codable concepts without codable children: Secondary | ICD-10-CM | POA: Insufficient documentation

## 2016-11-12 DIAGNOSIS — R0602 Shortness of breath: Secondary | ICD-10-CM | POA: Diagnosis not present

## 2016-11-12 DIAGNOSIS — Z1389 Encounter for screening for other disorder: Secondary | ICD-10-CM | POA: Diagnosis not present

## 2016-11-12 DIAGNOSIS — R5383 Other fatigue: Secondary | ICD-10-CM | POA: Diagnosis not present

## 2016-11-12 DIAGNOSIS — E669 Obesity, unspecified: Secondary | ICD-10-CM | POA: Diagnosis not present

## 2016-11-12 DIAGNOSIS — Z1331 Encounter for screening for depression: Secondary | ICD-10-CM | POA: Insufficient documentation

## 2016-11-12 DIAGNOSIS — Z86711 Personal history of pulmonary embolism: Secondary | ICD-10-CM | POA: Diagnosis not present

## 2016-11-12 DIAGNOSIS — I10 Essential (primary) hypertension: Secondary | ICD-10-CM

## 2016-11-12 DIAGNOSIS — Z0289 Encounter for other administrative examinations: Secondary | ICD-10-CM

## 2016-11-12 NOTE — Progress Notes (Signed)
Office: [phone]  /  Fax: [phone]   Dear Dr. Lenord Burns,   Thank you for referring Lauren Burns to our clinic. The following note includes my evaluation and treatment recommendations.  HPI:   Chief Complaint: OBESITY    Lauren Burns has been referred by Lauren Burns. Lauren Fellers, MD for consultation regarding her obesity and obesity related comorbidities.    Lauren Burns (MR# [id number]) is a 41 y.o. female who presents on 11/12/2016 for obesity evaluation and treatment. Current BMI is Body mass index is 48.66 kg/m.Marland Kitchen Lauren Burns has been struggling with her weight for many years and has been unsuccessful in either losing weight, maintaining weight loss, or reaching her healthy weight goal.     Lauren Burns attended our information session and states she is currently in the action stage of change and ready to dedicate time achieving and maintaining a healthier weight. Lauren Burns is interested in becoming our patient and working on intensive lifestyle modifications including (but not limited to) diet, exercise and weight loss.    Lauren Burns states her family eats meals together she struggles with family and or coworkers weight loss sabotage her desired weight loss is 140 lbs she started gaining weight in her late 20's and after son's birth her heaviest weight ever was 320 lbs. she has significant food cravings issues  she skips meals frequently she is frequently drinking liquids with calories she frequently makes poor food choices she has problems with excessive hunger  she frequently eats larger portions than normal  she has binge eating behaviors she struggles with emotional eating    Fatigue Lauren Burns feels her energy is lower than it should be. This has worsened with weight gain and has not worsened recently. Lauren Burns admits to daytime somnolence and  admits to waking up still tired. Patient is at risk for obstructive sleep apnea. Lauren Burns has a history of symptoms of daytime fatigue and  morning fatigue. Patient generally gets 7 hours of sleep per night, and states they generally have restless sleep. Snoring is present. Apneic episodes are present. Epworth Sleepiness Score is 8  Dyspnea on exertion Lauren Burns notes increasing shortness of breath with exercising and seems to be worsening over time with weight gain. She notes getting out of breath sooner with activity than she used to. This has not gotten worse recently. Lauren Burns denies orthopnea.  Vitamin D deficiency Lauren Burns has a diagnosis of vitamin D deficiency. She is currently taking OTC vit D and still notes fatigue but denies nausea, vomiting or muscle weakness.  Hypertension Lauren Burns is a 41 y.o. female with hypertension.  Lauren Burns denies chest pain. She is working weight loss to help control her blood pressure with the goal of decreasing her risk of heart attack and stroke. Lauren Burns blood pressure is currently controlled on losartan/HCTZ.  History of Pulmonary Embolism Lauren Burns has a history of pulmonary embolism and is on warfarin with Antiphospholipid Antibodies. She denies chest pain, leg edema or leg pain.  Depression Screen Lauren Burns (modified PHQ-9) score was  Depression screen PHQ 2/9 11/12/2016  Decreased Interest 1  Down, Depressed, Hopeless 1  PHQ - 2 Score 2  Altered sleeping 2  Tired, decreased energy 3  Change in appetite 1  Feeling bad or failure about yourself  0  Trouble concentrating 1  Moving slowly or fidgety/restless 0  PHQ-9 Score 9    ALLERGIES: No Known Allergies  MEDICATIONS: Current Outpatient Prescriptions on File Prior to Visit  Medication Sig Dispense Refill  .  buPROPion (WELLBUTRIN XL) 150 MG 24 hr tablet Take 1 tablet (150 mg total) by mouth daily. 30 tablet 0  . DULoxetine (CYMBALTA) 30 MG capsule Take 1 capsule (30 mg total) by mouth daily. 30 capsule 3  . losartan-hydrochlorothiazide (HYZAAR) 100-25 MG tablet Take 1 tablet by mouth daily. 30  tablet 3  . Multiple Vitamin (MULTIVITAMIN) capsule Take 1 capsule by mouth daily.    . norethindrone (CAMILA) 0.35 MG tablet Take 1 tablet by mouth daily.    Marland Kitchen warfarin (COUMADIN) 4 MG tablet Take 1 tablet (4 mg total) by mouth daily. 30 tablet 0  . ALPRAZolam (XANAX) 0.5 MG tablet Take 1 tablet (0.5 mg total) by mouth at bedtime. (Patient not taking: Reported on 11/12/2016) 30 tablet 1  . amLODipine (NORVASC) 5 MG tablet Take 1 tablet (5 mg total) by mouth daily. (Patient not taking: Reported on 11/12/2016) 90 tablet 3  . Diethylpropion HCl CR 75 MG TB24 One po daily at 10 am 30 each 0  . Probiotic Product (PROBIOTIC DAILY PO) Take by mouth.    . warfarin (COUMADIN) 1 MG tablet Take 1 tablet (1 mg total) by mouth daily. (Patient not taking: Reported on 11/12/2016) 30 tablet 0   No current facility-administered medications on file prior to visit.     PAST MEDICAL HISTORY: Past Medical History:  Diagnosis Date  . Antiphospholipid antibody syndrome (HCC)   . Anxiety   . Arthritis   . Depression   . Endometriosis   . Endometriosis   . Fatigue   . Fibromyalgia   . GERD (gastroesophageal reflux disease)   . Heart murmur   . Hypertension   . IBS (irritable bowel syndrome)   . IBS (irritable bowel syndrome)   . Obesity   . Pulmonary embolism (HCC) 2004  . Shortness of breath   . Sleep apnea   . Vitamin D deficiency     PAST SURGICAL HISTORY: No past surgical history on file.  SOCIAL HISTORY: Social History  Substance Use Topics  . Smoking status: Current Some Day Smoker  . Smokeless tobacco: Never Used  . Alcohol use No    FAMILY HISTORY: Family History  Problem Relation Age of Onset  . Hyperlipidemia Mother   . Depression Mother   . Anxiety disorder Mother   . Obesity Mother   . Heart disease Father   . Alcohol abuse Father   . Hypertension Father   . Cancer Father   . Alcoholism Father   . Obesity Father     ROS: Review of Systems  Constitutional: Positive for  malaise/fatigue.  HENT:       Dry Mouth   Respiratory: Positive for shortness of breath (with activity).   Cardiovascular: Negative for chest pain and orthopnea.       Negative Leg Edema Negative Leg Pain  Gastrointestinal: Positive for heartburn. Negative for nausea and vomiting.  Musculoskeletal:       Negative muscle weakness  Psychiatric/Behavioral: Positive for depression.    PHYSICAL EXAM: Blood pressure 117/79, pulse 74, temperature 97.8 F (36.6 C), temperature source Oral, height 5\' 8"  (1.727 m), weight (!) 320 lb (145.2 kg), SpO2 99 %. Body mass index is 48.66 kg/m. Physical Exam  Constitutional: She is oriented to person, place, and time. She appears well-developed and well-nourished.  Cardiovascular:  Murmur (Grade 1/6 Systolic Murmur) heard. Pulmonary/Chest: Effort normal.  Musculoskeletal: Normal range of motion.  Neurological: She is oriented to person, place, and time.  Skin: Skin is warm and  dry.  Psychiatric: She has a normal Burns and affect. Her behavior is normal.  Vitals reviewed.   RECENT LABS AND TESTS: BMET    Component Value Date/Time   NA 138 05/21/2016 1026   K 4.6 05/21/2016 1026   CL 102 05/21/2016 1026   CO2 25 05/21/2016 1026   GLUCOSE 97 05/21/2016 1026   BUN 11 05/21/2016 1026   CREATININE 0.80 05/21/2016 1026   CALCIUM 8.8 05/21/2016 1026   No results found for: HGBA1C No results found for: INSULIN CBC    Component Value Date/Time   WBC 5.6 05/21/2016 1026   RBC 4.72 05/21/2016 1026   HGB 13.6 05/21/2016 1026   HCT 42.0 05/21/2016 1026   PLT 263 05/21/2016 1026   MCV 89.0 05/21/2016 1026   MCH 28.8 05/21/2016 1026   MCHC 32.4 05/21/2016 1026   RDW 13.5 05/21/2016 1026   LYMPHSABS 1,736 05/21/2016 1026   MONOABS 336 05/21/2016 1026   EOSABS 280 05/21/2016 1026   BASOSABS 0 05/21/2016 1026   Iron/TIBC/Ferritin/ %Sat No results found for: IRON, TIBC, FERRITIN, IRONPCTSAT Lipid Panel     Component Value Date/Time   CHOL  176 05/21/2016 1026   TRIG 66 05/21/2016 1026   HDL 45 (L) 05/21/2016 1026   CHOLHDL 3.9 05/21/2016 1026   VLDL 13 05/21/2016 1026   LDLCALC 118 (H) 05/21/2016 1026   Hepatic Function Panel     Component Value Date/Time   PROT 6.9 05/21/2016 1026   ALBUMIN 3.9 05/21/2016 1026   AST 19 05/21/2016 1026   ALT 13 05/21/2016 1026   ALKPHOS 79 05/21/2016 1026   BILITOT 0.6 05/21/2016 1026      Component Value Date/Time   TSH 2.06 05/21/2016 1026   TSH 1.959 02/24/2011 1132    ECG  shows NSR with a rate of 74 BPM INDIRECT CALORIMETER done today shows a VO2 of 282 and a REE of 1962.  Her calculated basal metabolic rate is 1610 thus her basal metabolic rate is worse than expected.    ASSESSMENT AND PLAN: Other fatigue - Plan: EKG 12-Lead, Comprehensive metabolic panel, CBC with Differential/Platelet, Hemoglobin A1c, Insulin, random, Folate, Vitamin B12, T3, T4, free, TSH, Lipid Panel With LDL/HDL Ratio  Shortness of breath on exertion  Essential hypertension  Vitamin D deficiency - Plan: VITAMIN D 25 Hydroxy (Vit-D Deficiency, Fractures)  History of pulmonary embolism  Depression screening  Class 3 obesity with serious comorbidity and body mass index (BMI) of 45.0 to 49.9 in adult, unspecified obesity type (HCC)  PLAN: Fatigue Jacole was informed that her fatigue may be related to obesity, depression or many other causes. Labs will be ordered, and in the meanwhile Barbette has agreed to work on diet, exercise and weight loss to help with fatigue. Proper sleep hygiene was discussed including the need for 7-8 hours of quality sleep each night. A sleep study was not ordered based on symptoms and Epworth score.  Dyspnea on exertion Sereniti's shortness of breath appears to be obesity related and exercise induced. She has agreed to work on weight loss and gradually increase exercise to treat her exercise induced shortness of breath. If Asheton follows our instructions and loses  weight without improvement of her shortness of breath, we will plan to refer to pulmonology. We will monitor this condition regularly. Symphany agrees to this plan.  Vitamin D Deficiency Vearl was informed that low vitamin D levels contributes to fatigue and are associated with obesity, breast, and colon cancer. She agrees to  continue to take OTC Vitamin D and we will check labs and will follow up for routine testing of vitamin D, at least 2-3 times per year. She was informed of the risk of over-replacement of vitamin D and agrees to not increase her dose unless he discusses this with [place] first. Elizaveta agrees to follow up with our clinic in 2 weeks.  Hypertension We discussed sodium restriction, working on healthy weight loss, and a regular exercise program as the means to achieve improved blood pressure control. Lauren Burns agreed with this plan and agreed to follow up as directed. We will re-check blood pressure in 2 weeks and will continue to monitor her blood pressure as well as her progress with the above lifestyle modifications. She will continue her medications as prescribed and will watch for signs of hypotension as she continues her lifestyle modifications.  History of Pulmonary Embolism Keilany agrees to continue warfarin as prescribed and we will factorsteady vitamin K foods into her diet and will continue to monitor. Lauren Burns agrees to follow up with our clinic in 2 weeks.  Depression Screen Ger had a moderately positive depression screening. Depression is commonly associated with obesity and often results in emotional eating behaviors. We will monitor this closely and work on CBT to help improve the non-hunger eating patterns. Referral to Psychology may be required if no improvement is seen as she continues in our clinic.  Obesity Laiyah is currently in the action stage of change and her goal is to continue with weight loss efforts. I recommend Hazleigh begin the structured treatment plan  as follows:  She has agreed to follow the Category 3 plan +100 calories Anastasya has been instructed to eventually work up to a goal of 150 minutes of combined cardio and strengthening exercise per week for weight loss and overall health benefits. We discussed the following Behavioral Modification Strategies today: meal planning & cooking strategies, increasing lean protein intake and decreasing simple carbohydrates    She was informed of the importance of frequent follow up visits to maximize her success with intensive lifestyle modifications for her multiple health conditions. She was informed we would discuss her lab results at her next visit unless there is a critical issue that needs to be addressed sooner. Cielle agreed to keep her next visit at the agreed upon time to discuss these results.  I, Nevada Crane, am acting as transcriptionist for Quillian Quince, MD  I have reviewed the above documentation for accuracy and completeness, and I agree with the above. -Quillian Quince, MD   OBESITY BEHAVIORAL INTERVENTION VISIT  Today's visit was # 1 out of 22.  Starting weight: 320 lbs Starting date: 11/12/16 Today's weight : 320 lbs Today's date: 11/12/2016 Total lbs lost to date: 0 (Patients must lose 7 lbs in the first 6 months to continue with counseling)   ASK: We discussed the diagnosis of obesity with Neysa Bonito A Plog today and Ashtyn agreed to give [place] permission to discuss obesity behavioral modification therapy today.  ASSESS: Tina has the diagnosis of obesity and her BMI today is 75.8 Maudean is in the action stage of change   ADVISE: Layaan was educated on the multiple health risks of obesity as well as the benefit of weight loss to improve her health. She was advised of the need for long term treatment and the importance of lifestyle modifications.  AGREE: Multiple dietary modification options and treatment options were discussed and  Raelan agreed to follow the  Category 3 plan +100 calories  We discussed the following Behavioral Modification Strategies today: meal planning & cooking strategies, increasing lean protein intake and decreasing simple carbohydrates

## 2016-11-13 LAB — VITAMIN D 25 HYDROXY (VIT D DEFICIENCY, FRACTURES): Vit D, 25-Hydroxy: 28.1 ng/mL — ABNORMAL LOW (ref 30.0–100.0)

## 2016-11-13 LAB — CBC WITH DIFFERENTIAL/PLATELET
BASOS ABS: 0 10*3/uL (ref 0.0–0.2)
BASOS: 0 %
EOS (ABSOLUTE): 0.3 10*3/uL (ref 0.0–0.4)
Eos: 5 %
Hematocrit: 41.2 % (ref 34.0–46.6)
Hemoglobin: 13.7 g/dL (ref 11.1–15.9)
IMMATURE GRANULOCYTES: 0 %
Immature Grans (Abs): 0 10*3/uL (ref 0.0–0.1)
Lymphocytes Absolute: 1.6 10*3/uL (ref 0.7–3.1)
Lymphs: 27 %
MCH: 29.7 pg (ref 26.6–33.0)
MCHC: 33.3 g/dL (ref 31.5–35.7)
MCV: 89 fL (ref 79–97)
MONOS ABS: 0.4 10*3/uL (ref 0.1–0.9)
Monocytes: 6 %
NEUTROS PCT: 62 %
Neutrophils Absolute: 3.6 10*3/uL (ref 1.4–7.0)
PLATELETS: 297 10*3/uL (ref 150–379)
RBC: 4.61 x10E6/uL (ref 3.77–5.28)
RDW: 13.1 % (ref 12.3–15.4)
WBC: 5.9 10*3/uL (ref 3.4–10.8)

## 2016-11-13 LAB — COMPREHENSIVE METABOLIC PANEL
ALBUMIN: 4.3 g/dL (ref 3.5–5.5)
ALK PHOS: 108 IU/L (ref 39–117)
ALT: 14 IU/L (ref 0–32)
AST: 18 IU/L (ref 0–40)
Albumin/Globulin Ratio: 1.5 (ref 1.2–2.2)
BILIRUBIN TOTAL: 0.5 mg/dL (ref 0.0–1.2)
BUN/Creatinine Ratio: 15 (ref 9–23)
BUN: 13 mg/dL (ref 6–24)
CHLORIDE: 98 mmol/L (ref 96–106)
CO2: 25 mmol/L (ref 20–29)
Calcium: 9.3 mg/dL (ref 8.7–10.2)
Creatinine, Ser: 0.89 mg/dL (ref 0.57–1.00)
GFR calc Af Amer: 92 mL/min/{1.73_m2} (ref >59)
GFR, EST NON AFRICAN AMERICAN: 80 mL/min/{1.73_m2} (ref >59)
GLUCOSE: 98 mg/dL (ref 65–99)
Globulin, Total: 2.8 g/dL (ref 1.5–4.5)
POTASSIUM: 3.8 mmol/L (ref 3.5–5.2)
SODIUM: 137 mmol/L (ref 134–144)
TOTAL PROTEIN: 7.1 g/dL (ref 6.0–8.5)

## 2016-11-13 LAB — INSULIN, RANDOM: INSULIN: 10.7 u[IU]/mL (ref 2.6–24.9)

## 2016-11-13 LAB — LIPID PANEL WITH LDL/HDL RATIO
CHOLESTEROL TOTAL: 181 mg/dL (ref 100–199)
HDL: 48 mg/dL (ref >39)
LDL CALC: 120 mg/dL — AB (ref 0–99)
LDL/HDL RATIO: 2.5 ratio (ref 0.0–3.2)
TRIGLYCERIDES: 64 mg/dL (ref 0–149)
VLDL CHOLESTEROL CAL: 13 mg/dL (ref 5–40)

## 2016-11-13 LAB — HEMOGLOBIN A1C
Est. average glucose Bld gHb Est-mCnc: 111 mg/dL
Hgb A1c MFr Bld: 5.5 % (ref 4.8–5.6)

## 2016-11-13 LAB — TSH: TSH: 2.14 u[IU]/mL (ref 0.450–4.500)

## 2016-11-13 LAB — VITAMIN B12: Vitamin B-12: 337 pg/mL (ref 232–1245)

## 2016-11-13 LAB — T3: T3, Total: 131 ng/dL (ref 71–180)

## 2016-11-13 LAB — T4, FREE: Free T4: 1.39 ng/dL (ref 0.82–1.77)

## 2016-11-13 LAB — FOLATE: Folate: 9.7 ng/mL (ref >3.0)

## 2016-11-26 ENCOUNTER — Ambulatory Visit (INDEPENDENT_AMBULATORY_CARE_PROVIDER_SITE_OTHER): Payer: BLUE CROSS/BLUE SHIELD | Admitting: Family Medicine

## 2016-11-26 VITALS — BP 140/80 | HR 76 | Temp 97.6°F | Ht 68.0 in | Wt 315.0 lb

## 2016-11-26 DIAGNOSIS — E784 Other hyperlipidemia: Secondary | ICD-10-CM

## 2016-11-26 DIAGNOSIS — IMO0001 Reserved for inherently not codable concepts without codable children: Secondary | ICD-10-CM

## 2016-11-26 DIAGNOSIS — E669 Obesity, unspecified: Secondary | ICD-10-CM

## 2016-11-26 DIAGNOSIS — I10 Essential (primary) hypertension: Secondary | ICD-10-CM | POA: Diagnosis not present

## 2016-11-26 DIAGNOSIS — E8881 Metabolic syndrome: Secondary | ICD-10-CM

## 2016-11-26 DIAGNOSIS — E559 Vitamin D deficiency, unspecified: Secondary | ICD-10-CM

## 2016-11-26 DIAGNOSIS — Z6841 Body Mass Index (BMI) 40.0 and over, adult: Secondary | ICD-10-CM

## 2016-11-26 DIAGNOSIS — E7849 Other hyperlipidemia: Secondary | ICD-10-CM

## 2016-11-26 MED ORDER — VITAMIN D (ERGOCALCIFEROL) 1.25 MG (50000 UNIT) PO CAPS: 50000.0000 [IU] | ORAL_CAPSULE | ORAL | 0 refills | Status: DC

## 2016-11-26 NOTE — Progress Notes (Signed)
Office: [phone]  /  Fax: [phone]   HPI:   Chief Complaint: OBESITY Lauren Burns is here to discuss her progress with her obesity treatment plan. She is on the Category 3 plan and is following her eating plan approximately 100 % of the time. She states she is exercising 0 minutes 0 times per week. Lauren Burns did very well on Category 3 plan, her hunger was mostly controlled but she didn't like the breakfast options so much.  Her weight is (!) 315 lb (142.9 kg) today and has had a weight loss of 5 pounds over a period of 2 weeks since her last visit. She has lost 5 lbs since starting treatment with [place].  Vitamin D deficiency Lauren Burns has a diagnosis of vitamin D deficiency. She is not currently taking Vit D and she has fatigue but denies nausea, vomiting or muscle weakness.  Insulin Resistance Lauren Burns has a new diagnosis of insulin resistance based on her elevated fasting insulin level >5. Her fasting insulin is slightly elevated and she has mild polyphagia that has improved on Category 3 plan. Although Lauren Burns's blood glucose readings are still under good control, insulin resistance puts her at greater risk of metabolic syndrome and diabetes. She is not taking metformin currently and continues to work on diet and exercise to decrease risk of diabetes.  Hyperlipidemia Lauren Burns has hyperlipidemia and has been trying to improve her cholesterol levels with intensive lifestyle modification including a low saturated fat diet, exercise and weight loss. Her LDL is slightly elevated @ 120. She denies any chest pain, claudication or myalgias.  Hypertension Lauren Burns is a 41 y.o. female with hypertension. Lauren Burns denies chest pain, shortness of breath on exertion or headache. She is working weight loss to help control her blood pressure with the goal of decreasing her risk of heart attack and stroke. Lauren Burns's blood pressure is slightly elevated today, on hyzaar.  ALLERGIES: No Known  Allergies  MEDICATIONS: Current Outpatient Prescriptions on File Prior to Visit  Medication Sig Dispense Refill  . amLODipine (NORVASC) 5 MG tablet Take 1 tablet (5 mg total) by mouth daily. (Patient not taking: Reported on 11/12/2016) 90 tablet 3  . buPROPion (WELLBUTRIN XL) 150 MG 24 hr tablet Take 1 tablet (150 mg total) by mouth daily. 30 tablet 0  . Diethylpropion HCl CR 75 MG TB24 One po daily at 10 am 30 each 0  . DULoxetine (CYMBALTA) 30 MG capsule Take 1 capsule (30 mg total) by mouth daily. 30 capsule 3  . losartan-hydrochlorothiazide (HYZAAR) 100-25 MG tablet Take 1 tablet by mouth daily. 30 tablet 3  . Multiple Vitamin (MULTIVITAMIN) capsule Take 1 capsule by mouth daily.    . norethindrone (CAMILA) 0.35 MG tablet Take 1 tablet by mouth daily.    . Probiotic Product (PROBIOTIC DAILY PO) Take by mouth.    . warfarin (COUMADIN) 1 MG tablet Take 1 tablet (1 mg total) by mouth daily. (Patient not taking: Reported on 11/12/2016) 30 tablet 0  . warfarin (COUMADIN) 4 MG tablet Take 1 tablet (4 mg total) by mouth daily. 30 tablet 0   No current facility-administered medications on file prior to visit.     PAST MEDICAL HISTORY: Past Medical History:  Diagnosis Date  . Antiphospholipid antibody syndrome (HCC)   . Anxiety   . Arthritis   . Depression   . Endometriosis   . Endometriosis   . Fatigue   . Fibromyalgia   . GERD (gastroesophageal reflux disease)   .  Heart murmur   . Hypertension   . IBS (irritable bowel syndrome)   . IBS (irritable bowel syndrome)   . Obesity   . Pulmonary embolism (HCC) 2004  . Shortness of breath   . Sleep apnea   . Vitamin D deficiency     PAST SURGICAL HISTORY: No past surgical history on file.  SOCIAL HISTORY: Social History  Substance Use Topics  . Smoking status: Current Some Day Smoker  . Smokeless tobacco: Never Used  . Alcohol use No    FAMILY HISTORY: Family History  Problem Relation Age of Onset  . Hyperlipidemia Mother     . Depression Mother   . Anxiety disorder Mother   . Obesity Mother   . Heart disease Father   . Alcohol abuse Father   . Hypertension Father   . Cancer Father   . Alcoholism Father   . Obesity Father     ROS: Review of Systems  Constitutional: Positive for malaise/fatigue and weight loss.  Respiratory: Negative for shortness of breath (on exertion).   Cardiovascular: Negative for chest pain and claudication.  Gastrointestinal: Negative for nausea and vomiting.  Musculoskeletal: Negative for myalgias.       Negative muscle weakness  Neurological: Negative for headaches.    PHYSICAL EXAM: Blood pressure 140/80, pulse 76, temperature 97.6 F (36.4 C), temperature source Oral, height 5\' 8"  (1.727 m), weight (!) 315 lb (142.9 kg), SpO2 100 %. Body mass index is 47.9 kg/m. Physical Exam  Constitutional: She is oriented to person, place, and time. She appears well-developed and well-nourished.  Cardiovascular: Normal rate.   Pulmonary/Chest: Effort normal.  Musculoskeletal: Normal range of motion.  Neurological: She is oriented to person, place, and time.  Skin: Skin is warm and dry.  Psychiatric: She has a normal mood and affect. Her behavior is normal.  Vitals reviewed.   RECENT LABS AND TESTS: BMET    Component Value Date/Time   NA 137 11/12/2016 1128   K 3.8 11/12/2016 1128   CL 98 11/12/2016 1128   CO2 25 11/12/2016 1128   GLUCOSE 98 11/12/2016 1128   GLUCOSE 97 05/21/2016 1026   BUN 13 11/12/2016 1128   CREATININE 0.89 11/12/2016 1128   CREATININE 0.80 05/21/2016 1026   CALCIUM 9.3 11/12/2016 1128   GFRNONAA 80 11/12/2016 1128   GFRAA 92 11/12/2016 1128   Lab Results  Component Value Date   HGBA1C 5.5 11/12/2016   Lab Results  Component Value Date   INSULIN 10.7 11/12/2016   CBC    Component Value Date/Time   WBC 5.9 11/12/2016 1128   WBC 5.6 05/21/2016 1026   RBC 4.61 11/12/2016 1128   RBC 4.72 05/21/2016 1026   HGB 13.7 11/12/2016 1128   HCT  41.2 11/12/2016 1128   PLT 297 11/12/2016 1128   MCV 89 11/12/2016 1128   MCH 29.7 11/12/2016 1128   MCH 28.8 05/21/2016 1026   MCHC 33.3 11/12/2016 1128   MCHC 32.4 05/21/2016 1026   RDW 13.1 11/12/2016 1128   LYMPHSABS 1.6 11/12/2016 1128   MONOABS 336 05/21/2016 1026   EOSABS 0.3 11/12/2016 1128   BASOSABS 0.0 11/12/2016 1128   Iron/TIBC/Ferritin/ %Sat No results found for: IRON, TIBC, FERRITIN, IRONPCTSAT Lipid Panel     Component Value Date/Time   CHOL 181 11/12/2016 1128   TRIG 64 11/12/2016 1128   HDL 48 11/12/2016 1128   CHOLHDL 3.9 05/21/2016 1026   VLDL 13 05/21/2016 1026   LDLCALC 120 (H) 11/12/2016 1128  Hepatic Function Panel     Component Value Date/Time   PROT 7.1 11/12/2016 1128   ALBUMIN 4.3 11/12/2016 1128   AST 18 11/12/2016 1128   ALT 14 11/12/2016 1128   ALKPHOS 108 11/12/2016 1128   BILITOT 0.5 11/12/2016 1128      Component Value Date/Time   TSH 2.140 11/12/2016 1128   TSH 2.06 05/21/2016 1026   TSH 1.959 02/24/2011 1132    ASSESSMENT AND PLAN: Vitamin D deficiency - Plan: Vitamin D, Ergocalciferol, (DRISDOL) 50000 units CAPS capsule  Insulin resistance  Other hyperlipidemia  Essential hypertension  Class 3 obesity with serious comorbidity and body mass index (BMI) of 45.0 to 49.9 in adult, unspecified obesity type (HCC)  PLAN:  Vitamin D Deficiency Lauren Burns was informed that low vitamin D levels contributes to fatigue and are associated with obesity, breast, and colon cancer. She agrees to start taking prescription Vit D @50 ,000 IU every week #4 with no refills and she will follow up for routine testing of vitamin D, at least 2-3 times per year. She was informed of the risk of over-replacement of vitamin D and agrees to not increase her dose unless he discusses this with [place] first. Lauren Burns agrees to follow up with our clinic in 2 weeks.  Insulin Resistance Lauren Burns will continue to work on weight loss, exercise, and decreasing simple  carbohydrates in her diet to help decrease the risk of diabetes. We dicussed metformin including benefits and risks. She was informed that eating too many simple carbohydrates or too many calories at one sitting increases the likelihood of GI side effects. Lauren Burns declined metformin for now and prescription was not written today. Lauren Burns agreed to follow up with [place] as directed to monitor her progress.  Hyperlipidemia Lauren Burns was informed of the American Heart Association Guidelines emphasizing intensive lifestyle modifications as the first line treatment for hyperlipidemia. We discussed many lifestyle modifications today in depth, and Lauren Burns will continue to work on decreasing saturated fats such as fatty red meat, butter and many fried foods. She will also increase vegetables and lean protein in her diet and continue to work on exercise and weight loss efforts. We will recheck labs in 3 months.  Hypertension We discussed sodium restriction, working on healthy weight loss, and a regular exercise program as the means to achieve improved blood pressure control. Lauren Burns agreed with this plan and agreed to follow up as directed. We will continue to monitor her blood pressure as well as her progress with the above lifestyle modifications. Lauren Burns agrees to continue taking hyzaar as prescribed for now and will watch for signs of hypotension as she continues her lifestyle modifications. She agrees to follow up with our clinic in 2 weeks and we will recheck her blood pressure at that time.  Obesity Lauren Burns is currently in the action stage of change. As such, her goal is to continue with weight loss efforts She has agreed to follow the Category 3 plan + breakfast options Lauren Burns has been instructed to work up to a goal of 150 minutes of combined cardio and strengthening exercise per week for weight loss and overall health benefits. We discussed the following Behavioral Modification Strategies today: increasing  lean protein intake, decreasing simple carbohydrates, and meal planning & cooking strategies    Lauren Burns has agreed to follow up with our clinic in 2 weeks. She was informed of the importance of frequent follow up visits to maximize her success with intensive lifestyle modifications for her multiple health conditions.  I,  Lauren Burns, am acting as transcriptionist for Lauren Quince, MD  I have reviewed the above documentation for accuracy and completeness, and I agree with the above. -Lauren Quince, MD     OBESITY BEHAVIORAL INTERVENTION VISIT  Today's visit was # 2 out of 22.  Starting weight: 320 lbs Starting date: 11/12/16 Today's weight: 315 lbs Today's date: 11/26/16 Total lbs lost to date: 5 (Patients must lose 7 lbs in the first 6 months to continue with counseling)   ASK: We discussed the diagnosis of obesity with Lauren Burns today and Lauren Burns agreed to give [place] permission to discuss obesity behavioral modification therapy today.  ASSESS: Mekiyah has the diagnosis of obesity and her BMI today is 30 Flossie is in the action stage of change   ADVISE: Landa was educated on the multiple health risks of obesity as well as the benefit of weight loss to improve her health. She was advised of the need for long term treatment and the importance of lifestyle modifications.  AGREE: Multiple dietary modification options and treatment options were discussed and  Yalexa agreed to follow the Category 3 plan + breakfast options We discussed the following Behavioral Modification Stratagies today: increasing lean protein intake, decreasing simple carbohydrates, and meal planning & cooking strategies.

## 2016-12-09 ENCOUNTER — Ambulatory Visit (INDEPENDENT_AMBULATORY_CARE_PROVIDER_SITE_OTHER): Payer: BLUE CROSS/BLUE SHIELD | Admitting: Physician Assistant

## 2016-12-09 VITALS — BP 115/80 | HR 71 | Temp 98.4°F | Ht 68.0 in | Wt 310.0 lb

## 2016-12-09 DIAGNOSIS — E669 Obesity, unspecified: Secondary | ICD-10-CM

## 2016-12-09 DIAGNOSIS — Z6841 Body Mass Index (BMI) 40.0 and over, adult: Secondary | ICD-10-CM

## 2016-12-09 DIAGNOSIS — I10 Essential (primary) hypertension: Secondary | ICD-10-CM | POA: Diagnosis not present

## 2016-12-09 DIAGNOSIS — IMO0001 Reserved for inherently not codable concepts without codable children: Secondary | ICD-10-CM

## 2016-12-09 NOTE — Progress Notes (Signed)
Office: [phone]  /  Fax: [phone]   HPI:   Chief Complaint: OBESITY Lauren BonitoChristy is here to discuss her progress with her obesity treatment plan. She is on the  follow the Category 3 plan and is following her eating plan approximately 97 % of the time. She states she is exercising 0 minutes 0 times per week. Lauren BonitoChristy continues to do well with weight loss. She states her hunger is well controlled and would like more cooking ideas for her meals.  Her weight is (!) 310 lb (140.6 kg) today and has had a weight loss of 5 pounds over a period of 2 weeks since her last visit. She has lost 10 lbs since starting treatment with us.  Hypertension Lauren HaneChristy A Burns is a [age] y.o. female with hypertension. Lauren Burns's blood pressure is stable and she denies chest pain or dyspnea. She is working weight loss to help control her blood pressure with the goal of decreasing her risk of heart attack and stroke. Lauren Burns blood pressure is currently controlled.  ALLERGIES: No Known Allergies  MEDICATIONS: Current Outpatient Prescriptions on File Prior to Visit  Medication Sig Dispense Refill  . buPROPion (WELLBUTRIN XL) 150 MG 24 hr tablet Take 1 tablet (150 mg total) by mouth daily. 30 tablet 0  . DULoxetine (CYMBALTA) 30 MG capsule Take 1 capsule (30 mg total) by mouth daily. 30 capsule 3  . losartan-hydrochlorothiazide (HYZAAR) 100-25 MG tablet Take 1 tablet by mouth daily. 30 tablet 3  . norethindrone (CAMILA) 0.35 MG tablet Take 1 tablet by mouth daily.    . Vitamin D, Ergocalciferol, (DRISDOL) 50000 units CAPS capsule Take 1 capsule (50,000 Units total) by mouth every 7 (seven) days. 4 capsule 0  . warfarin (COUMADIN) 4 MG tablet Take 1 tablet (4 mg total) by mouth daily. 30 tablet 0  . warfarin (COUMADIN) 1 MG tablet Take 1 tablet (1 mg total) by mouth daily. (Patient not taking: Reported on 11/12/2016) 30 tablet 0   No current facility-administered medications on file prior to visit.     PAST  MEDICAL HISTORY: Past Medical History:  Diagnosis Date  . Antiphospholipid antibody syndrome (HCC)   . Anxiety   . Arthritis   . Depression   . Endometriosis   . Endometriosis   . Fatigue   . Fibromyalgia   . GERD (gastroesophageal reflux disease)   . Heart murmur   . Hypertension   . IBS (irritable bowel syndrome)   . IBS (irritable bowel syndrome)   . Obesity   . Pulmonary embolism (HCC) 2004  . Shortness of breath   . Sleep apnea   . Vitamin D deficiency     PAST SURGICAL HISTORY: No past surgical history on file.  SOCIAL HISTORY: Social History  Substance Use Topics  . Smoking status: Current Some Day Smoker  . Smokeless tobacco: Never Used  . Alcohol use No    FAMILY HISTORY: Family History  Problem Relation Age of Onset  . Hyperlipidemia Mother   . Depression Mother   . Anxiety disorder Mother   . Obesity Mother   . Heart disease Father   . Alcohol abuse Father   . Hypertension Father   . Cancer Father   . Alcoholism Father   . Obesity Father     ROS: Review of Systems  Constitutional: Positive for weight loss.  Respiratory: Negative for shortness of breath.   Cardiovascular: Negative for chest pain.    PHYSICAL EXAM: Blood pressure 115/80, pulse 71, temperature 98.4  F (36.9 C), temperature source Oral, height  (1.727 m), weight (!) 310 lb (140.6 kg), SpO2 99 %. Body mass index is 47.14 kg/m. Physical Exam  Constitutional: She is oriented to person, place, and time. She appears well-developed and well-nourished.  Cardiovascular: Normal rate.   Pulmonary/Chest: Effort normal.  Musculoskeletal: Normal range of motion.  Neurological: She is oriented to person, place, and time.  Skin: Skin is warm and dry.  Psychiatric: She has a normal mood and affect. Her behavior is normal.  Vitals reviewed.   RECENT LABS AND TESTS: BMET    Component Value Date/Time   NA 137 11/12/2016 1128   K 3.8 11/12/2016 1128   CL 98 11/12/2016 1128   CO2  25 11/12/2016 1128   GLUCOSE 98 11/12/2016 1128   GLUCOSE 97 05/21/2016 1026   BUN 13 11/12/2016 1128   CREATININE 0.89 11/12/2016 1128   CREATININE 0.80 05/21/2016 1026   CALCIUM 9.3 11/12/2016 1128   GFRNONAA 80 11/12/2016 1128   GFRAA 92 11/12/2016 1128   Lab Results  Component Value Date   HGBA1C 5.5 11/12/2016   Lab Results  Component Value Date   INSULIN 10.7 11/12/2016   CBC    Component Value Date/Time   WBC 5.9 11/12/2016 1128   WBC 5.6 05/21/2016 1026   RBC 4.61 11/12/2016 1128   RBC 4.72 05/21/2016 1026   HGB 13.7 11/12/2016 1128   HCT 41.2 11/12/2016 1128   PLT 297 11/12/2016 1128   MCV 89 11/12/2016 1128   MCH 29.7 11/12/2016 1128   MCH 28.8 05/21/2016 1026   MCHC 33.3 11/12/2016 1128   MCHC 32.4 05/21/2016 1026   RDW 13.1 11/12/2016 1128   LYMPHSABS 1.6 11/12/2016 1128   MONOABS 336 05/21/2016 1026   EOSABS 0.3 11/12/2016 1128   BASOSABS 0.0 11/12/2016 1128   Iron/TIBC/Ferritin/ %Sat No results found for: IRON, TIBC, FERRITIN, IRONPCTSAT Lipid Panel     Component Value Date/Time   CHOL 181 11/12/2016 1128   TRIG 64 11/12/2016 1128   HDL 48 11/12/2016 1128   CHOLHDL 3.9 05/21/2016 1026   VLDL 13 05/21/2016 1026   LDLCALC 120 (H) 11/12/2016 1128   Hepatic Function Panel     Component Value Date/Time   PROT 7.1 11/12/2016 1128   ALBUMIN 4.3 11/12/2016 1128   AST 18 11/12/2016 1128   ALT 14 11/12/2016 1128   ALKPHOS 108 11/12/2016 1128   BILITOT 0.5 11/12/2016 1128      Component Value Date/Time   TSH 2.140 11/12/2016 1128   TSH 2.06 05/21/2016 1026   TSH 1.959 02/24/2011 1132    ASSESSMENT AND PLAN: Essential hypertension  Class 3 obesity with serious comorbidity and body mass index (BMI) of 45.0 to 49.9 in adult, unspecified obesity type (HCC)  PLAN:  Hypertension We discussed sodium restriction, working on healthy weight loss, and a regular exercise program as the means to achieve improved blood pressure control. Lauren Burns agreed  with this plan and agreed to follow up as directed. We will continue to monitor her blood pressure as well as her progress with the above lifestyle modifications. She agrees to continue her medications as prescribed and will watch for signs of hypotension as she continues her lifestyle modifications. Lauren Burns agrees to follow up with our clinic in 2 to 3 weeks.  We spent > than 50% of the 15 minute visit on the counseling as documented in the note.  Obesity Lauren Burns is currently in the action stage of change. As such,  her goal is to continue with weight loss efforts She has agreed to follow the Category 3 plan Lauren Burns has been instructed to work up to a goal of 150 minutes of combined cardio and strengthening exercise per week for weight loss and overall health benefits. We discussed the following Behavioral Modification Strategies today: increasing lean protein intake and work on meal planning and easy cooking plans   Lauren Burns has agreed to follow up with our clinic in 2 to 3 weeks. She was informed of the importance of frequent follow up visits to maximize her success with intensive lifestyle modifications for her multiple health conditions.  I, Burt Knack, am acting as transcriptionist for Illa Level, PA-C  I have reviewed the above documentation for accuracy and completeness, and I agree with the above. -Illa Level, PA-C  I have reviewed the above note and agree with the plan. -Lauren Quince, MD   OBESITY BEHAVIORAL INTERVENTION VISIT  Today's visit was # 3 out of 22.  Starting weight: 320 lbs Starting date: 11/12/16 Today's weight: 310 lbs Today's date: 12/09/16 Total lbs lost to date: 10 (Patients must lose 7 lbs in the first 6 months to continue with counseling)   ASK: We discussed the diagnosis of obesity with Lauren Burns A Fosse today and Lauren Burns agreed to give [place] permission to discuss obesity behavioral modification therapy today.  ASSESS: Lauren Burns has the diagnosis of  obesity and her BMI today is 34 Lauren Burns is in the action stage of change   ADVISE: Lauren Burns was educated on the multiple health risks of obesity as well as the benefit of weight loss to improve her health. She was advised of the need for long term treatment and the importance of lifestyle modifications.  AGREE: Multiple dietary modification options and treatment options were discussed and  Ethlyn agreed to follow the Category 3 plan We discussed the following Behavioral Modification Strategies today: increasing lean protein intake and work on meal planning and easy cooking plans

## 2016-12-17 ENCOUNTER — Encounter: Payer: Self-pay | Admitting: Internal Medicine

## 2016-12-17 ENCOUNTER — Ambulatory Visit (INDEPENDENT_AMBULATORY_CARE_PROVIDER_SITE_OTHER): Payer: BLUE CROSS/BLUE SHIELD | Admitting: Internal Medicine

## 2016-12-17 ENCOUNTER — Other Ambulatory Visit: Payer: BLUE CROSS/BLUE SHIELD | Admitting: Internal Medicine

## 2016-12-17 VITALS — BP 120/82 | HR 76 | Temp 98.7°F | Wt 306.0 lb

## 2016-12-17 DIAGNOSIS — Z6841 Body Mass Index (BMI) 40.0 and over, adult: Secondary | ICD-10-CM | POA: Diagnosis not present

## 2016-12-17 DIAGNOSIS — Z7901 Long term (current) use of anticoagulants: Secondary | ICD-10-CM

## 2016-12-17 DIAGNOSIS — I10 Essential (primary) hypertension: Secondary | ICD-10-CM | POA: Diagnosis not present

## 2016-12-17 DIAGNOSIS — Z23 Encounter for immunization: Secondary | ICD-10-CM | POA: Diagnosis not present

## 2016-12-17 DIAGNOSIS — D6861 Antiphospholipid syndrome: Secondary | ICD-10-CM | POA: Diagnosis not present

## 2016-12-17 LAB — PROTIME-INR
INR: 1.3 — AB
PROTHROMBIN TIME: 13.7 s — AB (ref 9.0–11.5)

## 2016-12-17 MED ORDER — WARFARIN SODIUM 5 MG PO TABS: 5.0000 mg | ORAL_TABLET | Freq: Every day | ORAL | 0 refills | Status: DC

## 2016-12-17 NOTE — Progress Notes (Signed)
   Subjective:    Patient ID: Lauren Burns, female    DOB: 09-09-1975, 41 y.o.   MRN: [id number]  HPI She is being seen Dr. Dalbert Garnet and Dr. Francena Hanly dietitian for weight loss. She has lost 23 pounds.  However, her PT  INR is subtherapeutic at 1.3. She says she is taking Coumadin but I doubt it.  She has not been here since April at which time PT  INR was 2.6. We have discussed lack of follow-up previously. Records indicate she was given 30 tablets of Coumadin 4 mg on April 26. Was supposed to be on 4.5 mg of Coumadin daily.  She is on losartan HCTZ for hypertension. However it would appear that that has expired in August. We will check with pharmacy about this.  Therefore we are restarting her Coumadin at 5 mg daily and following up  in 2 weeks.    Review of Systems see above     Objective:   Physical Exam Not examined. Blood pressure is stable at 120/82.       Assessment & Plan:  Subtherapeutic ProTime INR suspect noncompliance with Coumadin  Plan: Check with pharmacy about refill on losartan HCTZ. Her blood pressures actually good today but I suspect she is out of this as well.  Refill Coumadin 4 mg and Coumadin 1 mg in follow-up in 2 weeks.  Flu vaccine given today.

## 2016-12-19 NOTE — Patient Instructions (Signed)
Flu vaccine given. Restart Coumadin 4.5 mg daily and follow-up in 2 weeks. Our office is to check with pharmacy about losartan HCTZ

## 2016-12-21 ENCOUNTER — Ambulatory Visit (INDEPENDENT_AMBULATORY_CARE_PROVIDER_SITE_OTHER): Payer: BLUE CROSS/BLUE SHIELD | Admitting: Physician Assistant

## 2016-12-21 VITALS — BP 123/84 | HR 74 | Temp 98.3°F | Ht 68.0 in | Wt 303.0 lb

## 2016-12-21 DIAGNOSIS — E559 Vitamin D deficiency, unspecified: Secondary | ICD-10-CM | POA: Diagnosis not present

## 2016-12-21 DIAGNOSIS — E669 Obesity, unspecified: Secondary | ICD-10-CM | POA: Diagnosis not present

## 2016-12-21 DIAGNOSIS — I10 Essential (primary) hypertension: Secondary | ICD-10-CM

## 2016-12-21 DIAGNOSIS — IMO0001 Reserved for inherently not codable concepts without codable children: Secondary | ICD-10-CM

## 2016-12-21 DIAGNOSIS — Z6841 Body Mass Index (BMI) 40.0 and over, adult: Secondary | ICD-10-CM | POA: Diagnosis not present

## 2016-12-21 MED ORDER — VITAMIN D (ERGOCALCIFEROL) 1.25 MG (50000 UNIT) PO CAPS: 50000.0000 [IU] | ORAL_CAPSULE | ORAL | 0 refills | Status: DC

## 2016-12-21 NOTE — Progress Notes (Signed)
Office: [phone]  /  Fax: [phone]   HPI:   Chief Complaint: OBESITY Lauren Burns is here to discuss her progress with her obesity treatment plan. She is on the  follow the Category 3 plan and is following her eating plan approximately 95 to 99 % of the time. She states she is walking for 30 to 45 minutes 1 to 2 times per week. Lauren Burns  Continues to do well with weight loss. She has been planning ahead well and states hunger is well controlled. Lauren Burns would like more options for dinner. Her weight is (!) 303 lb (137.4 kg) today and has had a weight loss of 7 pounds over a period of 2 weeks since her last visit. She has lost 17 lbs since starting treatment with [place].  Vitamin D deficiency Lauren Burns has a diagnosis of vitamin D deficiency. She is currently taking vit D and denies nausea, vomiting or muscle weakness.  Hypertension Lauren Burns is a 41 y.o. female with hypertension. Lauren Burns denies chest pain or shortness of breath on exertion. She is working weight loss to help control her blood pressure with the goal of decreasing her risk of heart attack and stroke. [name] blood pressure is currently controlled.   ALLERGIES: No Known Allergies  MEDICATIONS: Current Outpatient Prescriptions on File Prior to Visit  Medication Sig Dispense Refill   buPROPion (WELLBUTRIN XL) 150 MG 24 hr tablet Take 1 tablet (150 mg total) by mouth daily. 30 tablet 0   DULoxetine (CYMBALTA) 30 MG capsule Take 1 capsule (30 mg total) by mouth daily. 30 capsule 3   losartan-hydrochlorothiazide (HYZAAR) 100-25 MG tablet Take 1 tablet by mouth daily. 30 tablet 3   norethindrone (CAMILA) 0.35 MG tablet Take 1 tablet by mouth daily.     warfarin (COUMADIN) 5 MG tablet Take 1 tablet (5 mg total) by mouth daily. 30 tablet 0   No current facility-administered medications on file prior to visit.     PAST MEDICAL HISTORY: Past Medical History:  Diagnosis Date   Antiphospholipid antibody  syndrome (HCC)    Anxiety    Arthritis    Depression    Endometriosis    Endometriosis    Fatigue    Fibromyalgia    GERD (gastroesophageal reflux disease)    Heart murmur    Hypertension    IBS (irritable bowel syndrome)    IBS (irritable bowel syndrome)    Obesity    Pulmonary embolism (HCC) 2004   Shortness of breath    Sleep apnea    Vitamin D deficiency     PAST SURGICAL HISTORY: No past surgical history on file.  SOCIAL HISTORY: Social History  Substance Use Topics   Smoking status: Former Smoker    Quit date: 12/17/2008   Smokeless tobacco: Never Used   Alcohol use No    FAMILY HISTORY: Family History  Problem Relation Age of Onset   Hyperlipidemia Mother    Depression Mother    Anxiety disorder Mother    Obesity Mother    Heart disease Father    Alcohol abuse Father    Hypertension Father    Cancer Father    Alcoholism Father    Obesity Father     ROS: Review of Systems  Constitutional: Positive for weight loss.  Respiratory: Negative for shortness of breath (on exertion).   Cardiovascular: Negative for chest pain.  Gastrointestinal: Negative for nausea and vomiting.  Musculoskeletal:       Negative muscle weakness  PHYSICAL EXAM: Blood pressure 123/84, pulse 74, temperature 98.3 F (36.8 C), temperature source Oral, height  (1.727 m), weight (!) 303 lb (137.4 kg), last menstrual period 12/21/2016, SpO2 99 %. Body mass index is 46.07 kg/m. Physical Exam  Constitutional: She is oriented to person, place, and time. She appears well-developed and well-nourished.  Cardiovascular: Normal rate.   Pulmonary/Chest: Effort normal.  Musculoskeletal: Normal range of motion.  Neurological: She is oriented to person, place, and time.  Skin: Skin is warm and dry.  Psychiatric: She has a normal mood and affect. Her behavior is normal.  Vitals reviewed.   RECENT LABS AND TESTS: BMET    Component Value Date/Time    NA 137 11/12/2016 1128   K 3.8 11/12/2016 1128   CL 98 11/12/2016 1128   CO2 25 11/12/2016 1128   GLUCOSE 98 11/12/2016 1128   GLUCOSE 97 05/21/2016 1026   BUN 13 11/12/2016 1128   CREATININE 0.89 11/12/2016 1128   CREATININE 0.80 05/21/2016 1026   CALCIUM 9.3 11/12/2016 1128   GFRNONAA 80 11/12/2016 1128   GFRAA 92 11/12/2016 1128   Lab Results  Component Value Date   HGBA1C 5.5 11/12/2016   Lab Results  Component Value Date   INSULIN 10.7 11/12/2016   CBC    Component Value Date/Time   WBC 5.9 11/12/2016 1128   WBC 5.6 05/21/2016 1026   RBC 4.61 11/12/2016 1128   RBC 4.72 05/21/2016 1026   HGB 13.7 11/12/2016 1128   HCT 41.2 11/12/2016 1128   PLT 297 11/12/2016 1128   MCV 89 11/12/2016 1128   MCH 29.7 11/12/2016 1128   MCH 28.8 05/21/2016 1026   MCHC 33.3 11/12/2016 1128   MCHC 32.4 05/21/2016 1026   RDW 13.1 11/12/2016 1128   LYMPHSABS 1.6 11/12/2016 1128   MONOABS 336 05/21/2016 1026   EOSABS 0.3 11/12/2016 1128   BASOSABS 0.0 11/12/2016 1128   Iron/TIBC/Ferritin/ %Sat No results found for: IRON, TIBC, FERRITIN, IRONPCTSAT Lipid Panel     Component Value Date/Time   CHOL 181 11/12/2016 1128   TRIG 64 11/12/2016 1128   HDL 48 11/12/2016 1128   CHOLHDL 3.9 05/21/2016 1026   VLDL 13 05/21/2016 1026   LDLCALC 120 (H) 11/12/2016 1128   Hepatic Function Panel     Component Value Date/Time   PROT 7.1 11/12/2016 1128   ALBUMIN 4.3 11/12/2016 1128   AST 18 11/12/2016 1128   ALT 14 11/12/2016 1128   ALKPHOS 108 11/12/2016 1128   BILITOT 0.5 11/12/2016 1128      Component Value Date/Time   TSH 2.140 11/12/2016 1128   TSH 2.06 05/21/2016 1026   TSH 1.959 02/24/2011 1132    ASSESSMENT AND PLAN: Vitamin D deficiency - Plan: Vitamin D, Ergocalciferol, (DRISDOL) 50000 units CAPS capsule  Essential hypertension  Class 3 obesity with serious comorbidity and body mass index (BMI) of 45.0 to 49.9 in adult, unspecified obesity type (HCC)  PLAN:  Vitamin  D Deficiency Cinthia was informed that low vitamin D levels contributes to fatigue and are associated with obesity, breast, and colon cancer. She agrees to continue to take prescription Vit D ,000 IU every week, we will refill for 1 month and will follow up for routine testing of vitamin D, at least 2-3 times per year. She was informed of the risk of over-replacement of vitamin D and agrees to not increase her dose unless he discusses this with [place] first. Shakeia agrees to follow up with our clinic in 2  weeks.  Hypertension We discussed sodium restriction, working on healthy weight loss, and a regular exercise program as the means to achieve improved blood pressure control. Estel agreed with this plan and agreed to follow up as directed. We will continue to monitor her blood pressure as well as her progress with the above lifestyle modifications. She will continue her medications as prescribed and will watch for signs of hypotension as she continues her lifestyle modifications.  Obesity Aislin is currently in the action stage of change. As such, her goal is to continue with weight loss efforts She has agreed to keep a food journal with 600 calories and 40 grams of protein at supper daily and follow the Category 3 plan Braiden has been instructed to work up to a goal of 150 minutes of combined cardio and strengthening exercise per week for weight loss and overall health benefits. We discussed the following Behavioral Modification Strategies today: increasing lean protein intake and work on meal planning and easy cooking plans  Tatyana has agreed to follow up with our clinic in 2 weeks. She was informed of the importance of frequent follow up visits to maximize her success with intensive lifestyle modifications for her multiple health conditions.  I, [place] Crane, am acting as transcriptionist for Illa Level, PA-C  I have reviewed the above documentation for accuracy and completeness, and I  agree with the above. -Illa Level, PA-C  I have reviewed the above note and agree with the plan. -Quillian Quince, MD   OBESITY BEHAVIORAL INTERVENTION VISIT  Today's visit was # 4 out of 22.  Starting weight: 320 lbs Starting date: 11/12/16 Today's weight : 303 lbs Today's date: 12/21/2016 Total lbs lost to date: 17 (Patients must lose 7 lbs in the first 6 months to continue with counseling)   ASK: We discussed the diagnosis of obesity with Neysa Bonito A Faughnan today and Tranika agreed to give [place] permission to discuss obesity behavioral modification therapy today.  ASSESS: Panhia has the diagnosis of obesity and her BMI today is 46.08 Dorianne is in the action stage of change   ADVISE: Luretha was educated on the multiple health risks of obesity as well as the benefit of weight loss to improve her health. She was advised of the need for long term treatment and the importance of lifestyle modifications.  AGREE: Multiple dietary modification options and treatment options were discussed and  Geovana agreed to keep a food journal with 600 calories and 40 grams of protein at supper daily and follow the Category 3 plan We discussed the following Behavioral Modification Strategies today: increasing lean protein intake and work on meal planning and easy cooking plans

## 2016-12-23 ENCOUNTER — Telehealth: Payer: Self-pay

## 2016-12-23 MED ORDER — BUPROPION HCL ER (XL) 150 MG PO TB24: 150.0000 mg | ORAL_TABLET | Freq: Every day | ORAL | 5 refills | Status: DC

## 2016-12-23 NOTE — Telephone Encounter (Signed)
Received fax from [facility] in regards to a refill on Wellbutrin XL  for patient. Medication was refilled per Dr. Beryle Quant request. Sent 6 month supply

## 2016-12-31 ENCOUNTER — Other Ambulatory Visit: Payer: BLUE CROSS/BLUE SHIELD | Admitting: Internal Medicine

## 2016-12-31 ENCOUNTER — Ambulatory Visit: Payer: BLUE CROSS/BLUE SHIELD | Admitting: Internal Medicine

## 2016-12-31 DIAGNOSIS — Z7901 Long term (current) use of anticoagulants: Secondary | ICD-10-CM | POA: Diagnosis not present

## 2016-12-31 LAB — PROTIME-INR
INR: 1.9 — ABNORMAL HIGH
Prothrombin Time: 20.3 s — ABNORMAL HIGH (ref 9.0–11.5)

## 2017-01-04 ENCOUNTER — Ambulatory Visit (INDEPENDENT_AMBULATORY_CARE_PROVIDER_SITE_OTHER): Payer: BLUE CROSS/BLUE SHIELD | Admitting: Physician Assistant

## 2017-01-04 ENCOUNTER — Ambulatory Visit (INDEPENDENT_AMBULATORY_CARE_PROVIDER_SITE_OTHER): Payer: BLUE CROSS/BLUE SHIELD | Admitting: Internal Medicine

## 2017-01-04 ENCOUNTER — Encounter: Payer: Self-pay | Admitting: Internal Medicine

## 2017-01-04 VITALS — BP 126/86 | HR 78 | Temp 98.6°F | Wt 303.0 lb

## 2017-01-04 VITALS — BP 131/82 | HR 69 | Temp 97.4°F | Ht 68.0 in | Wt 300.0 lb

## 2017-01-04 DIAGNOSIS — E559 Vitamin D deficiency, unspecified: Secondary | ICD-10-CM

## 2017-01-04 DIAGNOSIS — Z6841 Body Mass Index (BMI) 40.0 and over, adult: Secondary | ICD-10-CM

## 2017-01-04 DIAGNOSIS — H6502 Acute serous otitis media, left ear: Secondary | ICD-10-CM | POA: Diagnosis not present

## 2017-01-04 DIAGNOSIS — Z7901 Long term (current) use of anticoagulants: Secondary | ICD-10-CM

## 2017-01-04 DIAGNOSIS — D6861 Antiphospholipid syndrome: Secondary | ICD-10-CM

## 2017-01-04 MED ORDER — WARFARIN SODIUM 1 MG PO TABS: ORAL_TABLET | ORAL | 0 refills | Status: DC

## 2017-01-04 NOTE — Patient Instructions (Signed)
Congratulations on weight loss. Increase Coumadin to 5.5 mg daily and return in 2 weeks

## 2017-01-04 NOTE — Progress Notes (Signed)
   Subjective:    Patient ID: Lauren Burns, female    DOB: 07/09/1975, 41 y.o.   MRN: [id number]  HPI Patient in with history of antiphospholipid antibody syndrome requiring chronic anticoagulation. Currently on Coumadin 5 mg daily. Pro time is subtherapeutic at 1.9.  Remains on diet per Dr. Dalbert Garnet. Is doing well with that diet.  Also complaining of some left ear discomfort and some postnasal drip.    Review of Systems see above     Objective:   Physical Exam Right TM is normal. Left TM full but not red.       Assessment & Plan:  Chronic anticoagulation-increase Coumadin to 5.5 mg daily and return in 2 weeks.  Left serous otitis media-recommend over-the-counter decongestant for a few days

## 2017-01-05 NOTE — Progress Notes (Signed)
Office: [phone]  /  Fax: [phone]   HPI:   Chief Complaint: OBESITY Lauren Burns is here to discuss her progress with her obesity treatment plan. She is on the Category 3 plan and is following her eating plan approximately 97 % of the time. She states she is walking for 30 minutes 2 times per week. Lauren Burns continues to do well with weight loss. She plans her meals well and states hunger is well controlled. She would like more variety at dinner. Her weight is 300 lb (136.1 kg) today and has had a weight loss of 3 pounds over a period of 2 weeks since her last visit. She has lost 12 lbs since starting treatment with [place].  Vitamin D deficiency Lauren Burns has a diagnosis of vitamin D deficiency. She is currently taking vit D and denies nausea, vomiting or muscle weakness.   ALLERGIES: No Known Allergies  MEDICATIONS: Current Outpatient Prescriptions on File Prior to Visit  Medication Sig Dispense Refill  . buPROPion (WELLBUTRIN XL) 150 MG 24 hr tablet Take 1 tablet (150 mg total) by mouth daily. 30 tablet 5  . DULoxetine (CYMBALTA) 30 MG capsule Take 1 capsule (30 mg total) by mouth daily. 30 capsule 3  . losartan-hydrochlorothiazide (HYZAAR) 100-25 MG tablet Take 1 tablet by mouth daily. 30 tablet 3  . norethindrone (CAMILA) 0.35 MG tablet Take 1 tablet by mouth daily.    . Vitamin D, Ergocalciferol, (DRISDOL) 50000 units CAPS capsule Take 1 capsule (50,000 Units total) by mouth every 7 (seven) days. 4 capsule 0  . warfarin (COUMADIN) 5 MG tablet Take 1 tablet (5 mg total) by mouth daily. 30 tablet 0   No current facility-administered medications on file prior to visit.     PAST MEDICAL HISTORY: Past Medical History:  Diagnosis Date  . Antiphospholipid antibody syndrome (HCC)   . Anxiety   . Arthritis   . Depression   . Endometriosis   . Endometriosis   . Fatigue   . Fibromyalgia   . GERD (gastroesophageal reflux disease)   . Heart murmur   . Hypertension   . IBS  (irritable bowel syndrome)   . IBS (irritable bowel syndrome)   . Obesity   . Pulmonary embolism (HCC) 2004  . Shortness of breath   . Sleep apnea   . Vitamin D deficiency     PAST SURGICAL HISTORY: No past surgical history on file.  SOCIAL HISTORY: Social History  Substance Use Topics  . Smoking status: Former Smoker    Quit date: 12/17/2008  . Smokeless tobacco: Never Used  . Alcohol use No    FAMILY HISTORY: Family History  Problem Relation Age of Onset  . Hyperlipidemia Mother   . Depression Mother   . Anxiety disorder Mother   . Obesity Mother   . Heart disease Father   . Alcohol abuse Father   . Hypertension Father   . Cancer Father   . Alcoholism Father   . Obesity Father     ROS: Review of Systems  Constitutional: Positive for weight loss.  Gastrointestinal: Negative for nausea and vomiting.  Musculoskeletal:       Negative muscle weakness    PHYSICAL EXAM: Blood pressure 131/82, pulse 69, temperature (!) 97.4 F (36.3 C), temperature source Oral, height  (1.727 m), weight 300 lb (136.1 kg), last menstrual period 12/21/2016, SpO2 98 %. Body mass index is 45.61 kg/m. Physical Exam  Constitutional: She is oriented to person, place, and time. She appears well-developed and  well-nourished.  Cardiovascular: Normal rate.   Pulmonary/Chest: Effort normal.  Musculoskeletal: Normal range of motion.  Neurological: She is oriented to person, place, and time.  Skin: Skin is warm and dry.  Psychiatric: She has a normal mood and affect. Her behavior is normal.  Vitals reviewed.   RECENT LABS AND TESTS: BMET    Component Value Date/Time   NA 137 11/12/2016 1128   K 3.8 11/12/2016 1128   CL 98 11/12/2016 1128   CO2 25 11/12/2016 1128   GLUCOSE 98 11/12/2016 1128   GLUCOSE 97 05/21/2016 1026   BUN 13 11/12/2016 1128   CREATININE 0.89 11/12/2016 1128   CREATININE 0.80 05/21/2016 1026   CALCIUM 9.3 11/12/2016 1128   GFRNONAA 80 11/12/2016 1128    GFRAA 92 11/12/2016 1128   Lab Results  Component Value Date   HGBA1C 5.5 11/12/2016   Lab Results  Component Value Date   INSULIN 10.7 11/12/2016   CBC    Component Value Date/Time   WBC 5.9 11/12/2016 1128   WBC 5.6 05/21/2016 1026   RBC 4.61 11/12/2016 1128   RBC 4.72 05/21/2016 1026   HGB 13.7 11/12/2016 1128   HCT 41.2 11/12/2016 1128   PLT 297 11/12/2016 1128   MCV 89 11/12/2016 1128   MCH 29.7 11/12/2016 1128   MCH 28.8 05/21/2016 1026   MCHC 33.3 11/12/2016 1128   MCHC 32.4 05/21/2016 1026   RDW 13.1 11/12/2016 1128   LYMPHSABS 1.6 11/12/2016 1128   MONOABS 336 05/21/2016 1026   EOSABS 0.3 11/12/2016 1128   BASOSABS 0.0 11/12/2016 1128   Iron/TIBC/Ferritin/ %Sat No results found for: IRON, TIBC, FERRITIN, IRONPCTSAT Lipid Panel     Component Value Date/Time   CHOL 181 11/12/2016 1128   TRIG 64 11/12/2016 1128   HDL 48 11/12/2016 1128   CHOLHDL 3.9 05/21/2016 1026   VLDL 13 05/21/2016 1026   LDLCALC 120 (H) 11/12/2016 1128   Hepatic Function Panel     Component Value Date/Time   PROT 7.1 11/12/2016 1128   ALBUMIN 4.3 11/12/2016 1128   AST 18 11/12/2016 1128   ALT 14 11/12/2016 1128   ALKPHOS 108 11/12/2016 1128   BILITOT 0.5 11/12/2016 1128      Component Value Date/Time   TSH 2.140 11/12/2016 1128   TSH 2.06 05/21/2016 1026   TSH 1.959 02/24/2011 1132    ASSESSMENT AND PLAN: Vitamin D deficiency  Class 3 severe obesity with serious comorbidity and body mass index (BMI) of 45.0 to 49.9 in adult, unspecified obesity type (HCC)  PLAN:  Vitamin D Deficiency Lauren Burns was informed that low vitamin D levels contributes to fatigue and are associated with obesity, breast, and colon cancer. She agrees to continue to take prescription Vit D ,000 IU every week and will follow up for routine testing of vitamin D, at least 2-3 times per year. She was informed of the risk of over-replacement of vitamin D and agrees to not increase her dose unless he  discusses this with [place] first.  We spent > than 50% of the 15 minute visit on the counseling as documented in the note.  Obesity Lauren Burns is currently in the action stage of change. As such, her goal is to continue with weight loss efforts She has agreed to follow the Category 3 plan Lauren Burns has been instructed to work up to a goal of 150 minutes of combined cardio and strengthening exercise per week for weight loss and overall health benefits. We discussed the following Behavioral  Modification Strategies today: increasing lean protein intake and keeping healthy foods in the home  Lauren Burns has agreed to follow up with our clinic in 3 weeks. She was informed of the importance of frequent follow up visits to maximize her success with intensive lifestyle modifications for her multiple health conditions.  I, [place] Crane, am acting as transcriptionist for Illa Level, PA-C  I have reviewed the above documentation for accuracy and completeness, and I agree with the above. -Illa Level, PA-C  I have reviewed the above note and agree with the plan. -Quillian Quince, MD   OBESITY BEHAVIORAL INTERVENTION VISIT  Today's visit was # 5 out of 22.  Starting weight: 320 lbs Starting date: 11/12/16 Today's weight : 308 lbs Today's date: 01/04/2017 Total lbs lost to date: 12 (Patients must lose 7 lbs in the first 6 months to continue with counseling)   ASK: We discussed the diagnosis of obesity with Lauren Burns today and Lauren Burns agreed to give [place] permission to discuss obesity behavioral modification therapy today.  ASSESS: Lauren Burns has the diagnosis of obesity and her BMI today is 46.08 Lauren Burns is in the action stage of change   ADVISE: Topacio was educated on the multiple health risks of obesity as well as the benefit of weight loss to improve her health. She was advised of the need for long term treatment and the importance of lifestyle modifications.  AGREE: Multiple dietary modification  options and treatment options were discussed and  Lauren Burns agreed to keep a food journal with 1600 calories and 100 grams of protein  daily We discussed the following Behavioral Modification Strategies today: increasing lean protein intake and keeping healthy foods in the home

## 2017-01-18 ENCOUNTER — Other Ambulatory Visit: Payer: Self-pay

## 2017-01-18 MED ORDER — WARFARIN SODIUM 5 MG PO TABS: 5.0000 mg | ORAL_TABLET | Freq: Every day | ORAL | 0 refills | Status: DC

## 2017-01-21 ENCOUNTER — Telehealth: Payer: Self-pay

## 2017-01-21 ENCOUNTER — Encounter: Payer: Self-pay | Admitting: Internal Medicine

## 2017-01-21 ENCOUNTER — Other Ambulatory Visit: Payer: BLUE CROSS/BLUE SHIELD | Admitting: Internal Medicine

## 2017-01-21 DIAGNOSIS — Z7901 Long term (current) use of anticoagulants: Secondary | ICD-10-CM

## 2017-01-21 LAB — POCT INR: INR: 2.8 — AB (ref <=1.1)

## 2017-01-21 LAB — PROTIME-INR
INR: 2.8 — ABNORMAL HIGH
PROTHROMBIN TIME: 29.2 s — AB (ref 9.0–11.5)

## 2017-01-21 NOTE — Telephone Encounter (Signed)
Lauren Burns spoke with patient and she is aware to return in 6 weeks and Dr. Lenord FellersBaxley has reviewed her labs.

## 2017-01-25 ENCOUNTER — Ambulatory Visit (INDEPENDENT_AMBULATORY_CARE_PROVIDER_SITE_OTHER): Payer: BLUE CROSS/BLUE SHIELD | Admitting: Physician Assistant

## 2017-01-25 VITALS — BP 125/86 | HR 72 | Temp 97.9°F | Ht 68.0 in | Wt 293.0 lb

## 2017-01-25 DIAGNOSIS — Z6841 Body Mass Index (BMI) 40.0 and over, adult: Secondary | ICD-10-CM | POA: Diagnosis not present

## 2017-01-25 DIAGNOSIS — I10 Essential (primary) hypertension: Secondary | ICD-10-CM

## 2017-01-25 DIAGNOSIS — E559 Vitamin D deficiency, unspecified: Secondary | ICD-10-CM | POA: Diagnosis not present

## 2017-01-25 MED ORDER — VITAMIN D (ERGOCALCIFEROL) 1.25 MG (50000 UNIT) PO CAPS: 50000.0000 [IU] | ORAL_CAPSULE | ORAL | 0 refills | Status: DC

## 2017-01-25 NOTE — Progress Notes (Signed)
Office: [phone]  /  Fax: [phone]   HPI:   Chief Complaint: OBESITY Lauren Burns is here to discuss her progress with her obesity treatment plan. She is on the Category 3 plan and is following her eating plan approximately 100 % of the time. She states she is exercising 0 minutes 0 times per week. Lauren Burns continues to do well with weight loss. She has been incorporating variety into her meals and states her hunger is well controlled. She would like more meal planning ideas. Her weight is 293 lb (132.9 kg) today and has had a weight loss of 7 pounds over a period of 3 weeks since her last visit. She has lost 27 lbs since starting treatment with Lauren Burns.  Vitamin D deficiency Lauren Burns has a diagnosis of vitamin D deficiency. She is currently taking vit D and denies nausea, vomiting or muscle weakness.  Hypertension Lauren Burns is a 41 y.o. female with hypertension. Lauren Burns denies chest pain or shortness of breath on exertion. She is working weight loss to help control her blood pressure with the goal of decreasing her risk of heart attack and stroke. Lauren Burns blood pressure is currently controlled.  ALLERGIES: No Known Allergies  MEDICATIONS: Current Outpatient Prescriptions on File Prior to Visit  Medication Sig Dispense Refill  . buPROPion (WELLBUTRIN XL) 150 MG 24 hr tablet Take 1 tablet (150 mg total) by mouth daily. 30 tablet 5  . DULoxetine (CYMBALTA) 30 MG capsule Take 1 capsule (30 mg total) by mouth daily. 30 capsule 3  . losartan-hydrochlorothiazide (HYZAAR) 100-25 MG tablet Take 1 tablet by mouth daily. 30 tablet 3  . norethindrone (CAMILA) 0.35 MG tablet Take 1 tablet by mouth daily.    . Vitamin D, Ergocalciferol, (DRISDOL) 50000 units CAPS capsule Take 1 capsule (50,000 Units total) by mouth every 7 (seven) days. 4 capsule 0  . warfarin (COUMADIN) 1 MG tablet One half tab daily 30 tablet 0  . warfarin (COUMADIN) 5 MG tablet Take 1 tablet (5 mg total) by mouth  daily. 30 tablet 0   No current facility-administered medications on file prior to visit.     PAST MEDICAL HISTORY: Past Medical History:  Diagnosis Date  . Antiphospholipid antibody syndrome (HCC)   . Anxiety   . Arthritis   . Depression   . Endometriosis   . Endometriosis   . Fatigue   . Fibromyalgia   . GERD (gastroesophageal reflux disease)   . Heart murmur   . Hypertension   . IBS (irritable bowel syndrome)   . IBS (irritable bowel syndrome)   . Obesity   . Pulmonary embolism (HCC) 2004  . Shortness of breath   . Sleep apnea   . Vitamin D deficiency     PAST SURGICAL HISTORY: No past surgical history on file.  SOCIAL HISTORY: Social History  Substance Use Topics  . Smoking status: Former Smoker    Quit date: 12/17/2008  . Smokeless tobacco: Never Used  . Alcohol use No    FAMILY HISTORY: Family History  Problem Relation Age of Onset  . Hyperlipidemia Mother   . Depression Mother   . Anxiety disorder Mother   . Obesity Mother   . Heart disease Father   . Alcohol abuse Father   . Hypertension Father   . Cancer Father   . Alcoholism Father   . Obesity Father     ROS: Review of Systems  Constitutional: Positive for weight loss.  Respiratory: Negative for shortness of breath (  on exertion).   Cardiovascular: Negative for chest pain.  Gastrointestinal: Negative for nausea and vomiting.  Musculoskeletal:       Negative muscle weakness    PHYSICAL EXAM: Blood pressure 125/86, pulse 72, temperature 97.9 F (36.6 C), temperature source Oral, height 5\' 8"  (1.727 m), weight 293 lb (132.9 kg), last menstrual period 12/21/2016, SpO2 97 %. Body mass index is 44.55 kg/m. Physical Exam  Constitutional: She is oriented to person, place, and time. She appears well-developed and well-nourished.  Cardiovascular: Normal rate.   Pulmonary/Chest: Effort normal.  Musculoskeletal: Normal range of motion.  Neurological: She is oriented to person, place, and time.    Skin: Skin is warm and dry.  Psychiatric: She has a normal mood and affect. Her behavior is normal.  Vitals reviewed.   RECENT LABS AND TESTS: BMET    Component Value Date/Time   NA 137 11/12/2016 1128   K 3.8 11/12/2016 1128   CL 98 11/12/2016 1128   CO2 25 11/12/2016 1128   GLUCOSE 98 11/12/2016 1128   GLUCOSE 97 05/21/2016 1026   BUN 13 11/12/2016 1128   CREATININE 0.89 11/12/2016 1128   CREATININE 0.80 05/21/2016 1026   CALCIUM 9.3 11/12/2016 1128   GFRNONAA 80 11/12/2016 1128   GFRAA 92 11/12/2016 1128   Lab Results  Component Value Date   HGBA1C 5.5 11/12/2016   Lab Results  Component Value Date   INSULIN 10.7 11/12/2016   CBC    Component Value Date/Time   WBC 5.9 11/12/2016 1128   WBC 5.6 05/21/2016 1026   RBC 4.61 11/12/2016 1128   RBC 4.72 05/21/2016 1026   HGB 13.7 11/12/2016 1128   HCT 41.2 11/12/2016 1128   PLT 297 11/12/2016 1128   MCV 89 11/12/2016 1128   MCH 29.7 11/12/2016 1128   MCH 28.8 05/21/2016 1026   MCHC 33.3 11/12/2016 1128   MCHC 32.4 05/21/2016 1026   RDW 13.1 11/12/2016 1128   LYMPHSABS 1.6 11/12/2016 1128   MONOABS 336 05/21/2016 1026   EOSABS 0.3 11/12/2016 1128   BASOSABS 0.0 11/12/2016 1128   Iron/TIBC/Ferritin/ %Sat No results found for: IRON, TIBC, FERRITIN, IRONPCTSAT Lipid Panel     Component Value Date/Time   CHOL 181 11/12/2016 1128   TRIG 64 11/12/2016 1128   HDL 48 11/12/2016 1128   CHOLHDL 3.9 05/21/2016 1026   VLDL 13 05/21/2016 1026   LDLCALC 120 (H) 11/12/2016 1128   Hepatic Function Panel     Component Value Date/Time   PROT 7.1 11/12/2016 1128   ALBUMIN 4.3 11/12/2016 1128   AST 18 11/12/2016 1128   ALT 14 11/12/2016 1128   ALKPHOS 108 11/12/2016 1128   BILITOT 0.5 11/12/2016 1128      Component Value Date/Time   TSH 2.140 11/12/2016 1128   TSH 2.06 05/21/2016 1026   TSH 1.959 02/24/2011 1132    ASSESSMENT AND PLAN: Vitamin D deficiency - Plan: Vitamin D, Ergocalciferol, (DRISDOL) 50000  units CAPS capsule  Essential hypertension  Class 3 severe obesity with serious comorbidity and body mass index (BMI) of 40.0 to 44.9 in adult, unspecified obesity type (HCC)  PLAN:  Vitamin D Deficiency Lauren Burns was informed that low vitamin D levels contributes to fatigue and are associated with obesity, breast, and colon cancer. She agrees to continue to take prescription Vit D @50 ,000 IU every week #4 with no refills and will follow up for routine testing of vitamin D, at least 2-3 times per year. She was informed of the risk  of over-replacement of vitamin D and agrees to not increase her dose unless he discusses this with [place] first. Lauren Burns agrees to follow up with our clinic in 3 weeks.  Hypertension We discussed sodium restriction, working on healthy weight loss, and a regular exercise program as the means to achieve improved blood pressure control. Lauren Burns agreed with this plan and agreed to follow up as directed. We will continue to monitor her blood pressure as well as her progress with the above lifestyle modifications. She will continue her medications as prescribed and will watch for signs of hypotension as she continues her lifestyle modifications.  Obesity Lauren Burns is currently in the action stage of change. As such, her goal is to continue with weight loss efforts She has agreed to keep a food journal with 1600 calories and 100 grams of protein daily Lauren Burns has been instructed to work up to a goal of 150 minutes of combined cardio and strengthening exercise per week for weight loss and overall health benefits. We discussed the following Behavioral Modification Strategies today: increasing lean protein intake and work on meal planning and easy cooking plans  Lauren Burns has agreed to follow up with our clinic in 3 weeks. She was informed of the importance of frequent follow up visits to maximize her success with intensive lifestyle modifications for her multiple health conditions.  Lauren Burns,  Lauren Burns, am acting as transcriptionist for Lauren Level, Lauren Burns  Lauren Burns have reviewed the above documentation for accuracy and completeness, and Lauren Burns agree with the above. -Lauren Level, Lauren Burns  Lauren Burns have reviewed the above note and agree with the plan. -Lauren Quince, Lauren Burns   OBESITY BEHAVIORAL INTERVENTION VISIT  Today's visit was # 6 out of 22.  Starting weight: 320 lbs Starting date: 11/12/16 Today's weight : 293 lbs Today's date: 01/25/2017 Total lbs lost to date: 40 (Patients must lose 7 lbs in the first 6 months to continue with counseling)   ASK: We discussed the diagnosis of obesity with Lauren Bonito A Platt today and Lauren Burns agreed to give [place] permission to discuss obesity behavioral modification therapy today.  ASSESS: Lauren Burns has the diagnosis of obesity and her BMI today is 44.56 Lauren Burns is in the action stage of change   ADVISE: Lauren Burns was educated on the multiple health risks of obesity as well as the benefit of weight loss to improve her health. She was advised of the need for long term treatment and the importance of lifestyle modifications.  AGREE: Multiple dietary modification options and treatment options were discussed and  Lauren Burns agreed to keep a food journal with 1600 calories and 100 grams of protein daily We discussed the following Behavioral Modification Strategies today: increasing lean protein intake and work on meal planning and easy cooking plans

## 2017-02-02 ENCOUNTER — Telehealth: Payer: Self-pay

## 2017-02-02 MED ORDER — LOSARTAN POTASSIUM 50 MG PO TABS: 50.0000 mg | ORAL_TABLET | Freq: Every day | ORAL | 3 refills | Status: DC

## 2017-02-02 NOTE — Telephone Encounter (Signed)
[facility] Losartan 50mg  for patient. Medication was refilled per Dr. Beryle QuantBaxley's request. Sent 1 year

## 2017-02-04 ENCOUNTER — Ambulatory Visit: Payer: BLUE CROSS/BLUE SHIELD | Admitting: Internal Medicine

## 2017-02-16 ENCOUNTER — Ambulatory Visit (INDEPENDENT_AMBULATORY_CARE_PROVIDER_SITE_OTHER): Payer: BLUE CROSS/BLUE SHIELD | Admitting: Physician Assistant

## 2017-02-16 ENCOUNTER — Telehealth: Payer: Self-pay

## 2017-02-16 VITALS — BP 131/82 | HR 63 | Temp 98.0°F | Ht 68.0 in | Wt 284.0 lb

## 2017-02-16 DIAGNOSIS — E559 Vitamin D deficiency, unspecified: Secondary | ICD-10-CM | POA: Diagnosis not present

## 2017-02-16 DIAGNOSIS — E8881 Metabolic syndrome: Secondary | ICD-10-CM

## 2017-02-16 DIAGNOSIS — I10 Essential (primary) hypertension: Secondary | ICD-10-CM | POA: Diagnosis not present

## 2017-02-16 DIAGNOSIS — Z6841 Body Mass Index (BMI) 40.0 and over, adult: Secondary | ICD-10-CM | POA: Diagnosis not present

## 2017-02-16 DIAGNOSIS — E7849 Other hyperlipidemia: Secondary | ICD-10-CM | POA: Diagnosis not present

## 2017-02-16 MED ORDER — WARFARIN SODIUM 1 MG PO TABS: ORAL_TABLET | ORAL | 0 refills | Status: DC

## 2017-02-16 MED ORDER — WARFARIN SODIUM 5 MG PO TABS: 5.0000 mg | ORAL_TABLET | Freq: Every day | ORAL | 0 refills | Status: DC

## 2017-02-16 NOTE — Progress Notes (Signed)
Office: [phone]  /  Fax: [phone]   HPI:   Chief Complaint: OBESITY Lauren Burns is here to discuss her progress with her obesity treatment plan. She is on the keep a food journal with 1600 calories and 100 grams of protein daily and is following her eating plan approximately 100 % of the time. She states she is exercising 0 minutes 0 times per week. Lauren Burns continues to do well with weight loss and she continues to incorporate variety into her meals. Lauren Burns would like more meal planning ideas and holiday eating strategies. Her weight is 284 lb (128.8 kg) today and has had a weight loss of 9 pounds over a period of 3 weeks since her last visit. She has lost 36 lbs since starting treatment with [place].  Hyperlipidemia Lauren Burns has hyperlipidemia. She is not currently on statin and declines starting medications today. She has been trying to improve her cholesterol levels with intensive lifestyle modification including a low saturated fat diet, exercise and weight loss. She denies any chest pain, claudication or myalgias.  Vitamin D deficiency Lauren Burns has a diagnosis of vitamin D deficiency. She is currently taking vit D and denies nausea, vomiting or muscle weakness.  Insulin Resistance Lauren Burns has a diagnosis of insulin resistance based on her elevated fasting insulin level >5. Although Nakari's blood glucose readings are still under good control, insulin resistance puts her at greater risk of metabolic syndrome and diabetes. She is not taking metformin currently and continues to work on diet and exercise to decrease risk of diabetes. She denies polyphagia.  Hypertension Lauren Burns is a 41 y.o. female with hypertension. Lauren Burns denies chest pain or shortness of breath on exertion. She is working weight loss to help control her blood pressure with the goal of decreasing her risk of heart attack and stroke. Lauren Burns blood pressure is currently stable.  ALLERGIES: No Known  Allergies  MEDICATIONS: Current Outpatient Medications on File Prior to Visit  Medication Sig Dispense Refill  . buPROPion (WELLBUTRIN XL) 150 MG 24 hr tablet Take 1 tablet (150 mg total) by mouth daily. 30 tablet 5  . DULoxetine (CYMBALTA) 30 MG capsule Take 1 capsule (30 mg total) by mouth daily. 30 capsule 3  . losartan (COZAAR) 50 MG tablet Take 1 tablet (50 mg total) daily by mouth. 90 tablet 3  . losartan-hydrochlorothiazide (HYZAAR) 100-25 MG tablet Take 1 tablet by mouth daily. 30 tablet 3  . norethindrone (CAMILA) 0.35 MG tablet Take 1 tablet by mouth daily.    . Vitamin D, Ergocalciferol, (DRISDOL) 50000 units CAPS capsule Take 1 capsule (50,000 Units total) by mouth every 7 (seven) days. 4 capsule 0   No current facility-administered medications on file prior to visit.     PAST MEDICAL HISTORY: Past Medical History:  Diagnosis Date  . Antiphospholipid antibody syndrome (HCC)   . Anxiety   . Arthritis   . Depression   . Endometriosis   . Endometriosis   . Fatigue   . Fibromyalgia   . GERD (gastroesophageal reflux disease)   . Heart murmur   . Hypertension   . IBS (irritable bowel syndrome)   . IBS (irritable bowel syndrome)   . Obesity   . Pulmonary embolism (HCC) 2004  . Shortness of breath   . Sleep apnea   . Vitamin D deficiency     PAST SURGICAL HISTORY: No past surgical history on file.  SOCIAL HISTORY: Social History   Tobacco Use  . Smoking status: Former Smoker  Last attempt to quit: 12/17/2008    Years since quitting: 8.1  . Smokeless tobacco: Never Used  Substance Use Topics  . Alcohol use: No  . Drug use: No    FAMILY HISTORY: Family History  Problem Relation Age of Onset  . Hyperlipidemia Mother   . Depression Mother   . Anxiety disorder Mother   . Obesity Mother   . Heart disease Father   . Alcohol abuse Father   . Hypertension Father   . Cancer Father   . Alcoholism Father   . Obesity Father     ROS: Review of Systems    Constitutional: Positive for weight loss.  Respiratory: Negative for shortness of breath (on exertion).   Cardiovascular: Negative for chest pain and claudication.  Gastrointestinal: Negative for nausea and vomiting.  Musculoskeletal: Negative for myalgias.       Negative muscle weakness  Endo/Heme/Allergies:       Negative polyphagia    PHYSICAL EXAM: Blood pressure 131/82, pulse 63, temperature 98 F (36.7 C), temperature source Oral, height 5\' 8"  (1.727 m), weight 284 lb (128.8 kg), SpO2 99 %. Body mass index is 43.18 kg/m. Physical Exam  Constitutional: She is oriented to person, place, and time. She appears well-developed and well-nourished.  Cardiovascular: Normal rate.  Pulmonary/Chest: Effort normal.  Musculoskeletal: Normal range of motion.  Neurological: She is oriented to person, place, and time.  Skin: Skin is warm and dry.  Psychiatric: She has a normal mood and affect. Her behavior is normal.  Vitals reviewed.   RECENT LABS AND TESTS: BMET    Component Value Date/Time   NA 137 11/12/2016 1128   K 3.8 11/12/2016 1128   CL 98 11/12/2016 1128   CO2 25 11/12/2016 1128   GLUCOSE 98 11/12/2016 1128   GLUCOSE 97 05/21/2016 1026   BUN 13 11/12/2016 1128   CREATININE 0.89 11/12/2016 1128   CREATININE 0.80 05/21/2016 1026   CALCIUM 9.3 11/12/2016 1128   GFRNONAA 80 11/12/2016 1128   GFRAA 92 11/12/2016 1128   Lab Results  Component Value Date   HGBA1C 5.5 11/12/2016   Lab Results  Component Value Date   INSULIN 10.7 11/12/2016   CBC    Component Value Date/Time   WBC 5.9 11/12/2016 1128   WBC 5.6 05/21/2016 1026   RBC 4.61 11/12/2016 1128   RBC 4.72 05/21/2016 1026   HGB 13.7 11/12/2016 1128   HCT 41.2 11/12/2016 1128   PLT 297 11/12/2016 1128   MCV 89 11/12/2016 1128   MCH 29.7 11/12/2016 1128   MCH 28.8 05/21/2016 1026   MCHC 33.3 11/12/2016 1128   MCHC 32.4 05/21/2016 1026   RDW 13.1 11/12/2016 1128   LYMPHSABS 1.6 11/12/2016 1128   MONOABS  336 05/21/2016 1026   EOSABS 0.3 11/12/2016 1128   BASOSABS 0.0 11/12/2016 1128   Iron/TIBC/Ferritin/ %Sat No results found for: IRON, TIBC, FERRITIN, IRONPCTSAT Lipid Panel     Component Value Date/Time   CHOL 181 11/12/2016 1128   TRIG 64 11/12/2016 1128   HDL 48 11/12/2016 1128   CHOLHDL 3.9 05/21/2016 1026   VLDL 13 05/21/2016 1026   LDLCALC 120 (H) 11/12/2016 1128   Hepatic Function Panel     Component Value Date/Time   PROT 7.1 11/12/2016 1128   ALBUMIN 4.3 11/12/2016 1128   AST 18 11/12/2016 1128   ALT 14 11/12/2016 1128   ALKPHOS 108 11/12/2016 1128   BILITOT 0.5 11/12/2016 1128      Component Value Date/Time  TSH 2.140 11/12/2016 1128   TSH 2.06 05/21/2016 1026   TSH 1.959 02/24/2011 1132    ASSESSMENT AND PLAN: Other hyperlipidemia - Plan: Lipid Panel With LDL/HDL Ratio  Vitamin D deficiency - Plan: VITAMIN D 25 Hydroxy (Vit-D Deficiency, Fractures)  Insulin resistance - Plan: Comprehensive metabolic panel, Hemoglobin A1c, Insulin, random  Essential hypertension  Class 3 severe obesity with serious comorbidity and body mass index (BMI) of 40.0 to 44.9 in adult, unspecified obesity type (HCC)  PLAN:  Hyperlipidemia Lauren Burns was informed of the American Heart Association Guidelines emphasizing intensive lifestyle modifications as the first line treatment for hyperlipidemia. We discussed many lifestyle modifications today in depth, and Lauren Burns will continue to work on decreasing saturated fats such as fatty red meat, butter and many fried foods. She will also increase vegetables and lean protein in her diet and continue to work on exercise and weight loss efforts.  Vitamin D Deficiency Lauren Burns was informed that low vitamin D levels contributes to fatigue and are associated with obesity, breast, and colon cancer. She agrees to continue to take prescription Vit D @50 ,000 IU every week and will follow up for routine testing of vitamin D, at least 2-3 times per  year. She was informed of the risk of over-replacement of vitamin D and agrees to not increase her dose unless he discusses this with [place] first.  Insulin Resistance Lauren Burns will continue to work on weight loss, exercise, and decreasing simple carbohydrates in her diet to help decrease the risk of diabetes. We dicussed metformin including benefits and risks. She was informed that eating too many simple carbohydrates or too many calories at one sitting increases the likelihood of GI side effects. Lauren Burns declined metformin for now and prescription was not written today. Lauren Burns agreed to follow up with [place] as directed to monitor her progress.  Hypertension We discussed sodium restriction, working on healthy weight loss, and a regular exercise program as the means to achieve improved blood pressure control. Lauren Burns agreed with this plan and agreed to follow up as directed. We will continue to monitor her blood pressure as well as her progress with the above lifestyle modifications. She will continue her medications as prescribed and will watch for signs of hypotension as she continues her lifestyle modifications.  Obesity Lauren Burns is currently in the action stage of change. As such, her goal is to continue with weight loss efforts She has agreed to keep a food journal with 1600 calories and 100 grams of protein daily Lauren Burns has been instructed to work up to a goal of 150 minutes of combined cardio and strengthening exercise per week for weight loss and overall health benefits. We discussed the following Behavioral Modification Strategies today: increasing lean protein intake and holiday eating strategies   Lauren Burns has agreed to follow up with our clinic in 2 weeks. She was informed of the importance of frequent follow up visits to maximize her success with intensive lifestyle modifications for her multiple health conditions.  I, [place] Crane, am acting as transcriptionist for Lauren Burns Level, PA-C  I have  reviewed the above documentation for accuracy and completeness, and I agree with the above. -Lauren Burns Level, PA-C  I have reviewed the above note and agree with the plan. -Quillian Quince, MD   OBESITY BEHAVIORAL INTERVENTION VISIT  Today's visit was # 7 out of 22.  Starting weight: 320 lbs Starting date: 11/12/16 Today's weight : 284 lbs  Today's date: 02/16/2017 Total lbs lost to date: 36 (Patients must lose 7  lbs in the first 6 months to continue with counseling)   ASK: We discussed the diagnosis of obesity with Neysa Bonitohristy A Deeg today and [place] agreed to give us permission to discuss obesity behavioral modification therapy today.  ASSESS: Neysa BonitoChristy has the diagnosis of obesity and her BMI today is 43.19 Neysa BonitoChristy is in the action stage of change   ADVISE: Neysa BonitoChristy was educated on the multiple health risks of obesity as well as the benefit of weight loss to improve her health. She was advised of the need for long term treatment and the importance of lifestyle modifications.  AGREE: Multiple dietary modification options and treatment options were discussed and  Breanda agreed to keep a food journal with 1600 calories and 100 grams of protein daily We discussed the following Behavioral Modification Strategies today: increasing lean protein intake and holiday eating strategies

## 2017-02-16 NOTE — Addendum Note (Signed)
Addended by: Yolande JollyLAWSON, Larsen Dungan on: 02/16/2017 10:23 AM   Modules accepted: Orders

## 2017-02-16 NOTE — Telephone Encounter (Addendum)
[facility] Coumadin 5 mg & 1 mg for patient. Medication was refilled per Dr. Beryle QuantBaxley's request. Sent 30 days

## 2017-02-17 LAB — LIPID PANEL WITH LDL/HDL RATIO
CHOLESTEROL TOTAL: 161 mg/dL (ref 100–199)
HDL: 38 mg/dL — ABNORMAL LOW (ref >39)
LDL CALC: 111 mg/dL — AB (ref 0–99)
LDl/HDL Ratio: 2.9 ratio (ref 0.0–3.2)
Triglycerides: 60 mg/dL (ref 0–149)
VLDL Cholesterol Cal: 12 mg/dL (ref 5–40)

## 2017-02-17 LAB — COMPREHENSIVE METABOLIC PANEL
ALBUMIN: 4.5 g/dL (ref 3.5–5.5)
ALK PHOS: 92 IU/L (ref 39–117)
ALT: 17 IU/L (ref 0–32)
AST: 16 IU/L (ref 0–40)
Albumin/Globulin Ratio: 2 (ref 1.2–2.2)
BUN / CREAT RATIO: 19 (ref 9–23)
BUN: 15 mg/dL (ref 6–24)
Bilirubin Total: 0.5 mg/dL (ref 0.0–1.2)
CALCIUM: 9.3 mg/dL (ref 8.7–10.2)
CO2: 28 mmol/L (ref 20–29)
CREATININE: 0.79 mg/dL (ref 0.57–1.00)
Chloride: 101 mmol/L (ref 96–106)
GFR, EST AFRICAN AMERICAN: 107 mL/min/{1.73_m2} (ref >59)
GFR, EST NON AFRICAN AMERICAN: 93 mL/min/{1.73_m2} (ref >59)
GLUCOSE: 101 mg/dL — AB (ref 65–99)
Globulin, Total: 2.3 g/dL (ref 1.5–4.5)
Potassium: 4.6 mmol/L (ref 3.5–5.2)
Sodium: 140 mmol/L (ref 134–144)
TOTAL PROTEIN: 6.8 g/dL (ref 6.0–8.5)

## 2017-02-17 LAB — HEMOGLOBIN A1C
Est. average glucose Bld gHb Est-mCnc: 117 mg/dL
HEMOGLOBIN A1C: 5.7 % — AB (ref 4.8–5.6)

## 2017-02-17 LAB — VITAMIN D 25 HYDROXY (VIT D DEFICIENCY, FRACTURES): VIT D 25 HYDROXY: 43.9 ng/mL (ref 30.0–100.0)

## 2017-02-17 LAB — INSULIN, RANDOM: INSULIN: 9 u[IU]/mL (ref 2.6–24.9)

## 2017-03-02 ENCOUNTER — Ambulatory Visit: Payer: BLUE CROSS/BLUE SHIELD | Admitting: Internal Medicine

## 2017-03-02 ENCOUNTER — Other Ambulatory Visit: Payer: BLUE CROSS/BLUE SHIELD | Admitting: Internal Medicine

## 2017-03-02 ENCOUNTER — Ambulatory Visit (INDEPENDENT_AMBULATORY_CARE_PROVIDER_SITE_OTHER): Payer: BLUE CROSS/BLUE SHIELD | Admitting: Physician Assistant

## 2017-03-02 VITALS — BP 134/84 | HR 73 | Temp 98.1°F | Ht 68.0 in | Wt 281.0 lb

## 2017-03-02 DIAGNOSIS — Z6841 Body Mass Index (BMI) 40.0 and over, adult: Secondary | ICD-10-CM | POA: Diagnosis not present

## 2017-03-02 DIAGNOSIS — R7303 Prediabetes: Secondary | ICD-10-CM | POA: Diagnosis not present

## 2017-03-02 DIAGNOSIS — E559 Vitamin D deficiency, unspecified: Secondary | ICD-10-CM

## 2017-03-02 MED ORDER — METFORMIN HCL 500 MG PO TABS: 500.0000 mg | ORAL_TABLET | Freq: Every day | ORAL | 0 refills | Status: DC

## 2017-03-02 MED ORDER — VITAMIN D (ERGOCALCIFEROL) 1.25 MG (50000 UNIT) PO CAPS: 50000.0000 [IU] | ORAL_CAPSULE | ORAL | 0 refills | Status: DC

## 2017-03-02 NOTE — Progress Notes (Signed)
Office: [phone]  /  Fax: [phone]   HPI:   Chief Complaint: OBESITY Lauren Burns is here to discuss her progress with her obesity treatment plan. She is on the keep a food journal with 1600 calories and 100 grams of protein daily and is following her eating plan approximately 95 % of the time. She states she is exercising 0 minutes 0 times per week. Lauren Burns continues to do well with weight loss. She plans her meals well and states her hunger is well controlled. Her weight is 281 lb (127.5 kg) today and has had a weight loss of 3 pounds over a period of 2 weeks since her last visit. She has lost 39 lbs since starting treatment with [place].  Vitamin D deficiency Lauren Burns has a diagnosis of vitamin D deficiency. She is currently taking vit D and denies nausea, vomiting or muscle weakness.  Pre-Diabetes Lauren Burns has a new diagnosis of pre-diabetes based on her elevated Hgb A1c and was informed this puts her at greater risk of developing diabetes. Lauren Burns is not taking metformin currently and she continues to work on diet and exercise to decrease risk of diabetes. She denies nausea, polyphagia or hypoglycemia.  ALLERGIES: No Known Allergies  MEDICATIONS: Current Outpatient Medications on File Prior to Visit  Medication Sig Dispense Refill  . buPROPion (WELLBUTRIN XL) 150 MG 24 hr tablet Take 1 tablet (150 mg total) by mouth daily. 30 tablet 5  . DULoxetine (CYMBALTA) 30 MG capsule Take 1 capsule (30 mg total) by mouth daily. 30 capsule 3  . losartan (COZAAR) 50 MG tablet Take 1 tablet (50 mg total) daily by mouth. 90 tablet 3  . losartan-hydrochlorothiazide (HYZAAR) 100-25 MG tablet Take 1 tablet by mouth daily. 30 tablet 3  . norethindrone (CAMILA) 0.35 MG tablet Take 1 tablet by mouth daily.    . Vitamin D, Ergocalciferol, (DRISDOL) 50000 units CAPS capsule Take 1 capsule (50,000 Units total) by mouth every 7 (seven) days. 4 capsule 0  . warfarin (COUMADIN) 1 MG tablet One half tab daily  30 tablet 0  . warfarin (COUMADIN) 5 MG tablet Take 1 tablet (5 mg total) by mouth daily. 30 tablet 0   No current facility-administered medications on file prior to visit.     PAST MEDICAL HISTORY: Past Medical History:  Diagnosis Date  . Antiphospholipid antibody syndrome (HCC)   . Anxiety   . Arthritis   . Depression   . Endometriosis   . Endometriosis   . Fatigue   . Fibromyalgia   . GERD (gastroesophageal reflux disease)   . Heart murmur   . Hypertension   . IBS (irritable bowel syndrome)   . IBS (irritable bowel syndrome)   . Obesity   . Pulmonary embolism (HCC) 2004  . Shortness of breath   . Sleep apnea   . Vitamin D deficiency     PAST SURGICAL HISTORY: No past surgical history on file.  SOCIAL HISTORY: Social History   Tobacco Use  . Smoking status: Former Smoker    Last attempt to quit: 12/17/2008    Years since quitting: 8.2  . Smokeless tobacco: Never Used  Substance Use Topics  . Alcohol use: No  . Drug use: No    FAMILY HISTORY: Family History  Problem Relation Age of Onset  . Hyperlipidemia Mother   . Depression Mother   . Anxiety disorder Mother   . Obesity Mother   . Heart disease Father   . Alcohol abuse Father   . Hypertension  Father   . Cancer Father   . Alcoholism Father   . Obesity Father     ROS: Review of Systems  Constitutional: Positive for weight loss.  Gastrointestinal: Negative for nausea and vomiting.  Musculoskeletal:       Negative muscle weakness  Endo/Heme/Allergies:       Negative polyphagia Negative hypoglycemia    PHYSICAL EXAM: Blood pressure 134/84, pulse 73, temperature 98.1 F (36.7 C), temperature source Oral, height 5\' 8"  (1.727 m), weight 281 lb (127.5 kg), SpO2 100 %. Body mass index is 42.73 kg/m. Physical Exam  Constitutional: She is oriented to person, place, and time. She appears well-developed and well-nourished.  Cardiovascular: Normal rate.  Pulmonary/Chest: Effort normal.    Musculoskeletal: Normal range of motion.  Neurological: She is oriented to person, place, and time.  Skin: Skin is warm and dry.  Psychiatric: She has a normal mood and affect. Her behavior is normal.  Vitals reviewed.   RECENT LABS AND TESTS: BMET    Component Value Date/Time   NA 140 02/16/2017 0949   K 4.6 02/16/2017 0949   CL 101 02/16/2017 0949   CO2 28 02/16/2017 0949   GLUCOSE 101 (H) 02/16/2017 0949   GLUCOSE 97 05/21/2016 1026   BUN 15 02/16/2017 0949   CREATININE 0.79 02/16/2017 0949   CREATININE 0.80 05/21/2016 1026   CALCIUM 9.3 02/16/2017 0949   GFRNONAA 93 02/16/2017 0949   GFRAA 107 02/16/2017 0949   Lab Results  Component Value Date   HGBA1C 5.7 (H) 02/16/2017   HGBA1C 5.5 11/12/2016   Lab Results  Component Value Date   INSULIN 9.0 02/16/2017   INSULIN 10.7 11/12/2016   CBC    Component Value Date/Time   WBC 5.9 11/12/2016 1128   WBC 5.6 05/21/2016 1026   RBC 4.61 11/12/2016 1128   RBC 4.72 05/21/2016 1026   HGB 13.7 11/12/2016 1128   HCT 41.2 11/12/2016 1128   PLT 297 11/12/2016 1128   MCV 89 11/12/2016 1128   MCH 29.7 11/12/2016 1128   MCH 28.8 05/21/2016 1026   MCHC 33.3 11/12/2016 1128   MCHC 32.4 05/21/2016 1026   RDW 13.1 11/12/2016 1128   LYMPHSABS 1.6 11/12/2016 1128   MONOABS 336 05/21/2016 1026   EOSABS 0.3 11/12/2016 1128   BASOSABS 0.0 11/12/2016 1128   Iron/TIBC/Ferritin/ %Sat No results found for: IRON, TIBC, FERRITIN, IRONPCTSAT Lipid Panel     Component Value Date/Time   CHOL 161 02/16/2017 0949   TRIG 60 02/16/2017 0949   HDL 38 (L) 02/16/2017 0949   CHOLHDL 3.9 05/21/2016 1026   VLDL 13 05/21/2016 1026   LDLCALC 111 (H) 02/16/2017 0949   Hepatic Function Panel     Component Value Date/Time   PROT 6.8 02/16/2017 0949   ALBUMIN 4.5 02/16/2017 0949   AST 16 02/16/2017 0949   ALT 17 02/16/2017 0949   ALKPHOS 92 02/16/2017 0949   BILITOT 0.5 02/16/2017 0949      Component Value Date/Time   TSH 2.140  11/12/2016 1128   TSH 2.06 05/21/2016 1026   TSH 1.959 02/24/2011 1132    ASSESSMENT AND PLAN: Vitamin D deficiency - Plan: Vitamin D, Ergocalciferol, (DRISDOL) 50000 units CAPS capsule  Prediabetes - Plan: metFORMIN (GLUCOPHAGE) 500 MG tablet  Class 3 severe obesity with serious comorbidity and body mass index (BMI) of 40.0 to 44.9 in adult, unspecified obesity type (HCC)  PLAN:  Vitamin D Deficiency Lauren Burns was informed that low vitamin D levels contributes to fatigue and  are associated with obesity, breast, and colon cancer. She agrees to continue to take prescription Vit D @50 ,000 IU every week #4 with no refills and will follow up for routine testing of vitamin D, at least 2-3 times per year. She was informed of the risk of over-replacement of vitamin D and agrees to not increase her dose unless he discusses this with us first. Lauren Burns agrees to follow up with our clinic in 2 weeks.  Pre-Diabetes Lauren Burns will continue to work on weight loss, exercise, and decreasing simple carbohydrates in her diet to help decrease the risk of diabetes. We dicussed metformin including benefits and risks. She was informed that eating too many simple carbohydrates or too many calories at one sitting increases the likelihood of GI side effects. Lauren Burns agrees to start metformin 500 mg qd #30 with no refills.Lauren Burns agreed to follow up with us as directed to monitor her progress.  Obesity Lauren Burns is currently in the action stage of change. As such, her goal is to continue with weight loss efforts She has agreed to keep a food journal with 1600 calories and 100 grams of protein daily Lauren Burns has been instructed to work up to a goal of 150 minutes of combined cardio and strengthening exercise per week for weight loss and overall health benefits. We discussed the following Behavioral Modification Strategies today: increasing lean protein intake and work on meal planning and easy cooking plans  Lauren Burns has  agreed to follow up with our clinic in 2 weeks. She was informed of the importance of frequent follow up visits to maximize her success with intensive lifestyle modifications for her multiple health conditions.  I, [place] CraneJoanne Murray, am acting as transcriptionist for Illa LevelSahar Osman, PA-C  I have reviewed the above documentation for accuracy and completeness, and I agree with the above. -Illa LevelSahar Osman, PA-C  I have reviewed the above note and agree with the plan. -Quillian Quincearen Beasley, MD   OBESITY BEHAVIORAL INTERVENTION VISIT  Today's visit was # 8 out of 22.  Starting weight: 320 lbs Starting date: 11/12/16 Today's weight : 281 lbs Today's date: 03/02/2017 Total lbs lost to date: 4739 (Patients must lose 7 lbs in the first 6 months to continue with counseling)   ASK: We discussed the diagnosis of obesity with Lauren Burns A Burns today and Toccara agreed to give us permission to discuss obesity behavioral modification therapy today.  ASSESS: Lauren Burns has the diagnosis of obesity and her BMI today is 42.74 Lauren Burns is in the action stage of change   ADVISE: Lauren Burns was educated on the multiple health risks of obesity as well as the benefit of weight loss to improve her health. She was advised of the need for long term treatment and the importance of lifestyle modifications.  AGREE: Multiple dietary modification options and treatment options were discussed and  Lauren Burns agreed to keep a food journal with 1600 calories and 100 grams of protein daily We discussed the following Behavioral Modification Strategies today: increasing lean protein intake and work on meal planning and easy cooking plans

## 2017-03-03 ENCOUNTER — Other Ambulatory Visit: Payer: Self-pay | Admitting: Internal Medicine

## 2017-03-03 DIAGNOSIS — Z7901 Long term (current) use of anticoagulants: Secondary | ICD-10-CM

## 2017-03-11 ENCOUNTER — Other Ambulatory Visit (INDEPENDENT_AMBULATORY_CARE_PROVIDER_SITE_OTHER): Payer: BLUE CROSS/BLUE SHIELD | Admitting: Internal Medicine

## 2017-03-11 ENCOUNTER — Ambulatory Visit: Payer: BLUE CROSS/BLUE SHIELD | Admitting: Internal Medicine

## 2017-03-11 ENCOUNTER — Encounter: Payer: Self-pay | Admitting: Internal Medicine

## 2017-03-11 VITALS — BP 122/90 | HR 72 | Ht 68.0 in | Wt 278.0 lb

## 2017-03-11 DIAGNOSIS — R7302 Impaired glucose tolerance (oral): Secondary | ICD-10-CM

## 2017-03-11 DIAGNOSIS — Z7901 Long term (current) use of anticoagulants: Secondary | ICD-10-CM | POA: Diagnosis not present

## 2017-03-11 DIAGNOSIS — D6861 Antiphospholipid syndrome: Secondary | ICD-10-CM

## 2017-03-11 LAB — PROTIME-INR
INR: 1.8 — AB
PROTHROMBIN TIME: 18.5 s — AB (ref 9.0–11.5)

## 2017-03-11 MED ORDER — WARFARIN SODIUM 5 MG PO TABS: 5.0000 mg | ORAL_TABLET | Freq: Every day | ORAL | 0 refills | Status: DC

## 2017-03-11 NOTE — Progress Notes (Signed)
   Subjective:    Patient ID: Lauren Burns, female    DOB: 10/01/1975, 41 y.o.   MRN: [id number]  HPI for follow-up on anticoagulation for antiphospholipid antibody syndrome.  Was here in October and pro time INR was subtherapeutic.  Was increased to Coumadin 5.5 mg daily.  Continues to be seen for obesity at Dr. Francena HanlyBeasley's office.  Hemoglobin A1c was normal there August.  However by November her hemoglobin A1c had increased to 5.7%.  She is been diagnosed with prediabetes.    Review of Systems see above     Objective:   Physical Exam Not examined..  Her pro time INR is subtherapeutic at 1.8.  Apparently may have run out of some of her medication.       Assessment & Plan:  Subtherapeutic pro time INR  Chronic anticoagulation  Plan: She now has refills on both 5 and 1 mg Coumadin tablets.  Follow-up on December 28 for pro time INR without office visit.  Addendum 03/26/2017: Pro time INR is 2.3.  She is to follow-up in 6 weeks for pro time INR and office visit same day.  Current dose of Coumadin is 6 mg daily.  This can be changed to 6 mg tablet if INR is stable and she will not have to take 2 different tablets.

## 2017-03-11 NOTE — Addendum Note (Signed)
Addended by: Gregery NaVALENCIA, Lyncoln Ledgerwood P on: 03/11/2017 09:05 AM   Modules accepted: Orders

## 2017-03-16 ENCOUNTER — Ambulatory Visit (INDEPENDENT_AMBULATORY_CARE_PROVIDER_SITE_OTHER): Payer: BLUE CROSS/BLUE SHIELD | Admitting: Physician Assistant

## 2017-03-16 VITALS — BP 139/81 | HR 77 | Temp 98.0°F | Ht 68.0 in | Wt 275.0 lb

## 2017-03-16 DIAGNOSIS — E559 Vitamin D deficiency, unspecified: Secondary | ICD-10-CM | POA: Diagnosis not present

## 2017-03-16 DIAGNOSIS — Z6841 Body Mass Index (BMI) 40.0 and over, adult: Secondary | ICD-10-CM | POA: Diagnosis not present

## 2017-03-16 NOTE — Progress Notes (Signed)
Office: [phone]  /  Fax: [phone]   HPI:   Chief Complaint: OBESITY Lauren Burns is here to discuss her progress with her obesity treatment plan. She is on the keep a food journal with 1600 calories and 100 grams of protein daily and is following her eating plan approximately 97 % of the time. She states she is exercising 0 minutes 0 times per week. Lakya continues to do well with weight loss. She incorporates variety into her meals and she states her hunger is well controlled.  Her weight is 275 lb (124.7 kg) today and has had a weight loss of 6 pounds over a period of 2 weeks since her last visit. She has lost 45 lbs since starting treatment with [place].  Vitamin D deficiency Giselle has a diagnosis of vitamin D deficiency. She is on prescription Vit D, but not yet at goal. She denies nausea, vomiting or muscle weakness.  ALLERGIES: No Known Allergies  MEDICATIONS: Current Outpatient Medications on File Prior to Visit  Medication Sig Dispense Refill  . buPROPion (WELLBUTRIN XL) 150 MG 24 hr tablet Take 1 tablet (150 mg total) by mouth daily. 30 tablet 5  . DULoxetine (CYMBALTA) 30 MG capsule Take 1 capsule (30 mg total) by mouth daily. 30 capsule 3  . losartan (COZAAR) 50 MG tablet Take 1 tablet (50 mg total) daily by mouth. 90 tablet 3  . losartan-hydrochlorothiazide (HYZAAR) 100-25 MG tablet Take 1 tablet by mouth daily. 30 tablet 3  . metFORMIN (GLUCOPHAGE) 500 MG tablet Take 1 tablet (500 mg total) by mouth daily with breakfast. 30 tablet 0  . Vitamin D, Ergocalciferol, (DRISDOL) 50000 units CAPS capsule Take 1 capsule (50,000 Units total) by mouth every 7 (seven) days. 4 capsule 0  . warfarin (COUMADIN) 1 MG tablet One half tab daily (Patient taking differently: 1 mg. One tab daily) 30 tablet 0  . warfarin (COUMADIN) 5 MG tablet Take 1 tablet (5 mg total) by mouth daily. 30 tablet 0   No current facility-administered medications on file prior to visit.     PAST MEDICAL  HISTORY: Past Medical History:  Diagnosis Date  . Antiphospholipid antibody syndrome (HCC)   . Anxiety   . Arthritis   . Depression   . Endometriosis   . Endometriosis   . Fatigue   . Fibromyalgia   . GERD (gastroesophageal reflux disease)   . Heart murmur   . Hypertension   . IBS (irritable bowel syndrome)   . IBS (irritable bowel syndrome)   . Obesity   . Pulmonary embolism (HCC) 2004  . Shortness of breath   . Sleep apnea   . Vitamin D deficiency     PAST SURGICAL HISTORY: No past surgical history on file.  SOCIAL HISTORY: Social History   Tobacco Use  . Smoking status: Former Smoker    Last attempt to quit: 12/17/2008    Years since quitting: 8.2  . Smokeless tobacco: Never Used  Substance Use Topics  . Alcohol use: No  . Drug use: No    FAMILY HISTORY: Family History  Problem Relation Age of Onset  . Hyperlipidemia Mother   . Depression Mother   . Anxiety disorder Mother   . Obesity Mother   . Heart disease Father   . Alcohol abuse Father   . Hypertension Father   . Cancer Father   . Alcoholism Father   . Obesity Father     ROS: Review of Systems  Constitutional: Positive for weight loss.  Gastrointestinal: Negative for nausea and vomiting.  Musculoskeletal:       Negative muscle weakness    PHYSICAL EXAM: Blood pressure 139/81, pulse 77, temperature 98 F (36.7 C), temperature source Oral, height 5\' 8"  (1.727 m), weight 275 lb (124.7 kg), SpO2 99 %. Body mass index is 41.81 kg/m. Physical Exam  Constitutional: She is oriented to person, place, and time. She appears well-developed and well-nourished.  Cardiovascular: Normal rate.  Pulmonary/Chest: Effort normal.  Musculoskeletal: Normal range of motion.  Neurological: She is oriented to person, place, and time.  Skin: Skin is warm and dry.  Psychiatric: She has a normal mood and affect. Her behavior is normal.  Vitals reviewed.   RECENT LABS AND TESTS: BMET    Component Value  Date/Time   NA 140 02/16/2017 0949   K 4.6 02/16/2017 0949   CL 101 02/16/2017 0949   CO2 28 02/16/2017 0949   GLUCOSE 101 (H) 02/16/2017 0949   GLUCOSE 97 05/21/2016 1026   BUN 15 02/16/2017 0949   CREATININE 0.79 02/16/2017 0949   CREATININE 0.80 05/21/2016 1026   CALCIUM 9.3 02/16/2017 0949   GFRNONAA 93 02/16/2017 0949   GFRAA 107 02/16/2017 0949   Lab Results  Component Value Date   HGBA1C 5.7 (H) 02/16/2017   HGBA1C 5.5 11/12/2016   Lab Results  Component Value Date   INSULIN 9.0 02/16/2017   INSULIN 10.7 11/12/2016   CBC    Component Value Date/Time   WBC 5.9 11/12/2016 1128   WBC 5.6 05/21/2016 1026   RBC 4.61 11/12/2016 1128   RBC 4.72 05/21/2016 1026   HGB 13.7 11/12/2016 1128   HCT 41.2 11/12/2016 1128   PLT 297 11/12/2016 1128   MCV 89 11/12/2016 1128   MCH 29.7 11/12/2016 1128   MCH 28.8 05/21/2016 1026   MCHC 33.3 11/12/2016 1128   MCHC 32.4 05/21/2016 1026   RDW 13.1 11/12/2016 1128   LYMPHSABS 1.6 11/12/2016 1128   MONOABS 336 05/21/2016 1026   EOSABS 0.3 11/12/2016 1128   BASOSABS 0.0 11/12/2016 1128   Iron/TIBC/Ferritin/ %Sat No results found for: IRON, TIBC, FERRITIN, IRONPCTSAT Lipid Panel     Component Value Date/Time   CHOL 161 02/16/2017 0949   TRIG 60 02/16/2017 0949   HDL 38 (L) 02/16/2017 0949   CHOLHDL 3.9 05/21/2016 1026   VLDL 13 05/21/2016 1026   LDLCALC 111 (H) 02/16/2017 0949   Hepatic Function Panel     Component Value Date/Time   PROT 6.8 02/16/2017 0949   ALBUMIN 4.5 02/16/2017 0949   AST 16 02/16/2017 0949   ALT 17 02/16/2017 0949   ALKPHOS 92 02/16/2017 0949   BILITOT 0.5 02/16/2017 0949      Component Value Date/Time   TSH 2.140 11/12/2016 1128   TSH 2.06 05/21/2016 1026   TSH 1.959 02/24/2011 1132    ASSESSMENT AND PLAN: Vitamin D deficiency  Class 3 severe obesity with serious comorbidity and body mass index (BMI) of 40.0 to 44.9 in adult, unspecified obesity type (HCC)  PLAN:  Vitamin D  Deficiency Tylene was informed that low vitamin D levels contributes to fatigue and are associated with obesity, breast, and colon cancer. Lauren Burns agrees to continue taking prescription Vit D @50 ,000 IU every week #4 and will follow up for routine testing of vitamin D, at least 2-3 times per year. She was informed of the risk of over-replacement of vitamin D and agrees to not increase her dose unless he discusses this with us first. Lauren Bonitohristy  agrees to follow up with our clinic in 3 weeks.  We spent > than 50% of the 15 minute visit on the counseling as documented in the note.  Obesity Lauren Burns is currently in the action stage of change. As such, her goal is to continue with weight loss efforts She has agreed to keep a food journal with 1600 calories and 100 grams of protein daily Lauren Burns has been instructed to work up to a goal of 150 minutes of combined cardio and strengthening exercise per week for weight loss and overall health benefits. We discussed the following Behavioral Modification Strategies today: increasing lean protein intake and work on meal planning and easy cooking plans   Lauren Burns has agreed to follow up with our clinic in 3 weeks. She was informed of the importance of frequent follow up visits to maximize her success with intensive lifestyle modifications for her multiple health conditions.  I, Burt KnackSharon Martin, am acting as transcriptionist for Illa LevelSahar Alanea Woolridge, PA-C  I have reviewed the above documentation for accuracy and completeness, and I agree with the above. -Illa LevelSahar Kaydyn Sayas, PA-C  I have reviewed the above note and agree with the plan. -Quillian Quincearen Beasley, MD     Today's visit was # 9 out of 22.  Starting weight: 320 lbs Starting date: 11/12/16 Today's weight : 275 lbs  Today's date: 03/16/2017 Total lbs lost to date: 45 (Patients must lose 7 lbs in the first 6 months to continue with counseling)   ASK: We discussed the diagnosis of obesity with Lauren Bonitohristy A Coate today and  Verenis agreed to give us permission to discuss obesity behavioral modification therapy today.  ASSESS: Lauren Burns has the diagnosis of obesity and her BMI today is 41.82 Lauren Burns is in the action stage of change   ADVISE: Lauren Burns was educated on the multiple health risks of obesity as well as the benefit of weight loss to improve her health. She was advised of the need for long term treatment and the importance of lifestyle modifications.  AGREE: Multiple dietary modification options and treatment options were discussed and  Khira agreed to keep a food journal with 1600 calories and 100 grams of protein daily We discussed the following Behavioral Modification Strategies today: increasing lean protein intake and work on meal planning and easy cooking plans

## 2017-03-17 ENCOUNTER — Other Ambulatory Visit: Payer: Self-pay | Admitting: Internal Medicine

## 2017-03-17 DIAGNOSIS — Z7901 Long term (current) use of anticoagulants: Secondary | ICD-10-CM

## 2017-03-26 ENCOUNTER — Other Ambulatory Visit: Payer: BLUE CROSS/BLUE SHIELD | Admitting: Internal Medicine

## 2017-03-26 DIAGNOSIS — Z7901 Long term (current) use of anticoagulants: Secondary | ICD-10-CM | POA: Diagnosis not present

## 2017-03-26 LAB — PROTIME-INR
INR: 2.3 — AB
PROTHROMBIN TIME: 24.5 s — AB (ref 9.0–11.5)

## 2017-03-29 NOTE — Patient Instructions (Signed)
Continue with chronic anticoagulation with Coumadin and follow-up in 6 weeks.

## 2017-04-05 ENCOUNTER — Other Ambulatory Visit: Payer: Self-pay

## 2017-04-05 MED ORDER — WARFARIN SODIUM 1 MG PO TABS: ORAL_TABLET | ORAL | 0 refills | Status: DC

## 2017-04-06 ENCOUNTER — Ambulatory Visit (INDEPENDENT_AMBULATORY_CARE_PROVIDER_SITE_OTHER): Payer: BLUE CROSS/BLUE SHIELD | Admitting: Physician Assistant

## 2017-04-06 VITALS — BP 131/88 | HR 65 | Temp 97.6°F | Ht 68.0 in | Wt 271.0 lb

## 2017-04-06 DIAGNOSIS — R7303 Prediabetes: Secondary | ICD-10-CM

## 2017-04-06 DIAGNOSIS — Z6841 Body Mass Index (BMI) 40.0 and over, adult: Secondary | ICD-10-CM | POA: Diagnosis not present

## 2017-04-06 DIAGNOSIS — E559 Vitamin D deficiency, unspecified: Secondary | ICD-10-CM

## 2017-04-06 MED ORDER — METFORMIN HCL 500 MG PO TABS: 500.0000 mg | ORAL_TABLET | Freq: Every day | ORAL | 0 refills | Status: DC

## 2017-04-06 MED ORDER — VITAMIN D (ERGOCALCIFEROL) 1.25 MG (50000 UNIT) PO CAPS: 50000.0000 [IU] | ORAL_CAPSULE | ORAL | 0 refills | Status: DC

## 2017-04-06 NOTE — Progress Notes (Addendum)
Office: [phone]  /  Fax: [phone]   HPI:   Chief Complaint: OBESITY Neysa BonitoChristy is here to discuss her progress with her obesity treatment plan. She is on the keep a food journal with 1600 calories and 100 grams of protein daily and is following her eating plan approximately 98 % of the time. She states she is walking for 45 minutes 3 times per week. Neysa BonitoChristy continues to do well with weight loss. She managed to control her portions and be mindful of her eating over the holidays.  Her weight is 271 lb (122.9 kg) today and has had a weight loss of 4 pounds over a period of 3 weeks since her last visit. She has lost 49 lbs since starting treatment with us.  Pre-Diabetes Neysa BonitoChristy has a diagnosis of pre-diabetes based on her elevated Hgb A1c and was informed this puts her at greater risk of developing diabetes. She is taking metformin currently and continues to work on diet and exercise to decrease risk of diabetes. She denies polyphagia, nausea, or hypoglycemia.  Vitamin D deficiency Neysa BonitoChristy has a diagnosis of vitamin D deficiency. She is currently taking prescription Vit D and denies nausea, vomiting or muscle weakness.  ALLERGIES: No Known Allergies  MEDICATIONS: Current Outpatient Medications on File Prior to Visit  Medication Sig Dispense Refill  . buPROPion (WELLBUTRIN XL) 150 MG 24 hr tablet Take 1 tablet (150 mg total) by mouth daily. 30 tablet 5  . DULoxetine (CYMBALTA) 30 MG capsule Take 1 capsule (30 mg total) by mouth daily. 30 capsule 3  . losartan (COZAAR) 50 MG tablet Take 1 tablet (50 mg total) daily by mouth. 90 tablet 3  . losartan-hydrochlorothiazide (HYZAAR) 100-25 MG tablet Take 1 tablet by mouth daily. 30 tablet 3  . metFORMIN (GLUCOPHAGE) 500 MG tablet Take 1 tablet (500 mg total) by mouth daily with breakfast. 30 tablet 0  . Vitamin D, Ergocalciferol, (DRISDOL) 50000 units CAPS capsule Take 1 capsule (50,000 Units total) by mouth every 7 (seven) days. 4 capsule 0   . warfarin (COUMADIN) 1 MG tablet One half tab daily 30 tablet 0  . warfarin (COUMADIN) 5 MG tablet Take 1 tablet (5 mg total) by mouth daily. 30 tablet 0   No current facility-administered medications on file prior to visit.     PAST MEDICAL HISTORY: Past Medical History:  Diagnosis Date  . Antiphospholipid antibody syndrome (HCC)   . Anxiety   . Arthritis   . Depression   . Endometriosis   . Endometriosis   . Fatigue   . Fibromyalgia   . GERD (gastroesophageal reflux disease)   . Heart murmur   . Hypertension   . IBS (irritable bowel syndrome)   . IBS (irritable bowel syndrome)   . Obesity   . Pulmonary embolism (HCC) 2004  . Shortness of breath   . Sleep apnea   . Vitamin D deficiency     PAST SURGICAL HISTORY: No past surgical history on file.  SOCIAL HISTORY: Social History   Tobacco Use  . Smoking status: Former Smoker    Last attempt to quit: 12/17/2008    Years since quitting: 8.3  . Smokeless tobacco: Never Used  Substance Use Topics  . Alcohol use: No  . Drug use: No    FAMILY HISTORY: Family History  Problem Relation Age of Onset  . Hyperlipidemia Mother   . Depression Mother   . Anxiety disorder Mother   . Obesity Mother   . Heart disease Father   .  Alcohol abuse Father   . Hypertension Father   . Cancer Father   . Alcoholism Father   . Obesity Father     ROS: Review of Systems  Constitutional: Positive for weight loss.  Gastrointestinal: Negative for nausea and vomiting.  Musculoskeletal:       Negative muscle weakness  Endo/Heme/Allergies:       Negative polyphagia Negative hypoglycemia    PHYSICAL EXAM: Blood pressure 131/88, pulse 65, temperature 97.6 F (36.4 C), temperature source Oral, height 5\' 8"  (1.727 m), weight 271 lb (122.9 kg), SpO2 99 %. Body mass index is 41.21 kg/m. Physical Exam  Constitutional: She is oriented to person, place, and time. She appears well-developed and well-nourished.  Cardiovascular: Normal  rate.  Pulmonary/Chest: Effort normal.  Musculoskeletal: Normal range of motion.  Neurological: She is oriented to person, place, and time.  Skin: Skin is warm and dry.  Psychiatric: She has a normal mood and affect. Her behavior is normal.  Vitals reviewed.   RECENT LABS AND TESTS: BMET    Component Value Date/Time   NA 140 02/16/2017 0949   K 4.6 02/16/2017 0949   CL 101 02/16/2017 0949   CO2 28 02/16/2017 0949   GLUCOSE 101 (H) 02/16/2017 0949   GLUCOSE 97 05/21/2016 1026   BUN 15 02/16/2017 0949   CREATININE 0.79 02/16/2017 0949   CREATININE 0.80 05/21/2016 1026   CALCIUM 9.3 02/16/2017 0949   GFRNONAA 93 02/16/2017 0949   GFRAA 107 02/16/2017 0949   Lab Results  Component Value Date   HGBA1C 5.7 (H) 02/16/2017   HGBA1C 5.5 11/12/2016   Lab Results  Component Value Date   INSULIN 9.0 02/16/2017   INSULIN 10.7 11/12/2016   CBC    Component Value Date/Time   WBC 5.9 11/12/2016 1128   WBC 5.6 05/21/2016 1026   RBC 4.61 11/12/2016 1128   RBC 4.72 05/21/2016 1026   HGB 13.7 11/12/2016 1128   HCT 41.2 11/12/2016 1128   PLT 297 11/12/2016 1128   MCV 89 11/12/2016 1128   MCH 29.7 11/12/2016 1128   MCH 28.8 05/21/2016 1026   MCHC 33.3 11/12/2016 1128   MCHC 32.4 05/21/2016 1026   RDW 13.1 11/12/2016 1128   LYMPHSABS 1.6 11/12/2016 1128   MONOABS 336 05/21/2016 1026   EOSABS 0.3 11/12/2016 1128   BASOSABS 0.0 11/12/2016 1128   Iron/TIBC/Ferritin/ %Sat No results found for: IRON, TIBC, FERRITIN, IRONPCTSAT Lipid Panel     Component Value Date/Time   CHOL 161 02/16/2017 0949   TRIG 60 02/16/2017 0949   HDL 38 (L) 02/16/2017 0949   CHOLHDL 3.9 05/21/2016 1026   VLDL 13 05/21/2016 1026   LDLCALC 111 (H) 02/16/2017 0949   Hepatic Function Panel     Component Value Date/Time   PROT 6.8 02/16/2017 0949   ALBUMIN 4.5 02/16/2017 0949   AST 16 02/16/2017 0949   ALT 17 02/16/2017 0949   ALKPHOS 92 02/16/2017 0949   BILITOT 0.5 02/16/2017 0949        Component Value Date/Time   TSH 2.140 11/12/2016 1128   TSH 2.06 05/21/2016 1026   TSH 1.959 02/24/2011 1132  Results for Bram, Quinlan A (MRN [id number]) as of 04/06/2017 16:02  Ref. Range 02/16/2017 09:49  Vitamin D, 25-Hydroxy Latest Ref Range: 30.0 - 100.0 ng/mL 43.9    ASSESSMENT AND PLAN: Prediabetes - Plan: metFORMIN (GLUCOPHAGE) 500 MG tablet  Vitamin D deficiency - Plan: Vitamin D, Ergocalciferol, (DRISDOL) 50000 units CAPS capsule  Class 3 severe obesity  with serious comorbidity and body mass index (BMI) of 40.0 to 44.9 in adult, unspecified obesity type [facility])  PLAN:  Pre-Diabetes Czarina will continue to work on weight loss, exercise, and decreasing simple carbohydrates in her diet to help decrease the risk of diabetes. We dicussed metformin including benefits and risks. She was informed that eating too many simple carbohydrates or too many calories at one sitting increases the likelihood of GI side effects. Milagros agrees to continue taking metformin 500 mg q AM #30 and we will refill for 1 month. Ianna agrees to follow up with our clinic in 2 weeks as directed to monitor her progress.  Vitamin D Deficiency Erica was informed that low vitamin D levels contributes to fatigue and are associated with obesity, breast, and colon cancer. Dhruvi agrees to continue taking prescription Vit D @50 ,000 IU every week #4 and we will refill for 1 month. She will follow up for routine testing of vitamin D, at least 2-3 times per year. She was informed of the risk of over-replacement of vitamin D and agrees to not increase her dose unless he discusses this with [place] first. Kaileah agrees to follow up with our clinic in 2 weeks.  Obesity Zamyah is currently in the action stage of change. As such, her goal is to continue with weight loss efforts She has agreed to keep a food journal with 1600 calories and 100 grams of protein daily Teara has been instructed to work up to a goal of 150  minutes of combined cardio and strengthening exercise per week for weight loss and overall health benefits. We discussed the following Behavioral Modification Strategies today: increasing lean protein intake and planning for success   Kenneshia has agreed to follow up with our clinic in 2 weeks. She was informed of the importance of frequent follow up visits to maximize her success with intensive lifestyle modifications for her multiple health conditions.  OBESITY BEHAVIORAL INTERVENTION VISIT  Today's visit was # 10 out of 22.  Starting weight: 320 lbs Starting date: 11/12/16 Today's weight : 271 lbs  Today's date: 04/06/2017 Total lbs lost to date: 63 (Patients must lose 7 lbs in the first 6 months to continue with counseling)   ASK: We discussed the diagnosis of obesity with Neysa Bonito A Mallery today and Sophiamarie agreed to give [place] permission to discuss obesity behavioral modification therapy today.  ASSESS: Mellisa has the diagnosis of obesity and her BMI today is 41.22 Rie is in the action stage of change   ADVISE: Kristl was educated on the multiple health risks of obesity as well as the benefit of weight loss to improve her health. She was advised of the need for long term treatment and the importance of lifestyle modifications.  AGREE: Multiple dietary modification options and treatment options were discussed and  Reniyah agreed to the above obesity treatment plan.  Trude Mcburney, am acting as transcriptionist for Illa Level, PA-C I, Illa Level [facility], have reviewed this note and agree with the above  I have reviewed the above documentation for accuracy and completeness, and I agree with the above. -Quillian Quince, MD

## 2017-04-16 ENCOUNTER — Other Ambulatory Visit: Payer: Self-pay

## 2017-04-16 MED ORDER — WARFARIN SODIUM 5 MG PO TABS: 5.0000 mg | ORAL_TABLET | Freq: Every day | ORAL | 0 refills | Status: DC

## 2017-04-20 ENCOUNTER — Ambulatory Visit (INDEPENDENT_AMBULATORY_CARE_PROVIDER_SITE_OTHER): Payer: BLUE CROSS/BLUE SHIELD | Admitting: Physician Assistant

## 2017-04-20 VITALS — BP 138/82 | HR 80 | Temp 97.9°F | Ht 68.0 in | Wt 268.0 lb

## 2017-04-20 DIAGNOSIS — Z6841 Body Mass Index (BMI) 40.0 and over, adult: Secondary | ICD-10-CM | POA: Diagnosis not present

## 2017-04-20 DIAGNOSIS — I10 Essential (primary) hypertension: Secondary | ICD-10-CM | POA: Diagnosis not present

## 2017-04-20 DIAGNOSIS — E559 Vitamin D deficiency, unspecified: Secondary | ICD-10-CM

## 2017-04-20 MED ORDER — VITAMIN D (ERGOCALCIFEROL) 1.25 MG (50000 UNIT) PO CAPS
50000.0000 [IU] | ORAL_CAPSULE | ORAL | 0 refills | Status: DC
Start: 2017-04-20 — End: 2017-06-01

## 2017-04-20 NOTE — Progress Notes (Signed)
Office: [phone]  /  Fax: [phone]   HPI:   Chief Complaint: OBESITY Lauren Burns is here to discuss her progress with her obesity treatment plan. She is on the keep a food journal with 1600 calories and 100 grams of protein daily and is following her eating plan approximately 98 % of the time. She states she is exercising 0 minutes 0 times per week. Lauren Burns continues to do well with weight loss. She is mindful of her eating and keeps a strict food journal.  Her weight is 268 lb (121.6 kg) today and has had a weight loss of 3 pounds over a period of 2 weeks since her last visit. She has lost 52 lbs since starting treatment with [place].  Vitamin D deficiency Lauren Burns has a diagnosis of vitamin D deficiency. She is currently taking prescription Vit D and denies nausea, vomiting or muscle weakness.  Hypertension Lauren Burns is a 42 y.o. female with hypertension. Dafney's blood pressure is stable and denies chest pain or shortness of breath. She is working weight loss to help control her blood pressure with the goal of decreasing her risk of heart attack and stroke. Lauren Burns's blood pressure is currently controlled.  ALLERGIES: No Known Allergies  MEDICATIONS: Current Outpatient Medications on File Prior to Visit  Medication Sig Dispense Refill  . buPROPion (WELLBUTRIN XL) 150 MG 24 hr tablet Take 1 tablet (150 mg total) by mouth daily. 30 tablet 5  . DULoxetine (CYMBALTA) 30 MG capsule Take 1 capsule (30 mg total) by mouth daily. 30 capsule 3  . losartan (COZAAR) 50 MG tablet Take 1 tablet (50 mg total) daily by mouth. 90 tablet 3  . losartan-hydrochlorothiazide (HYZAAR) 100-25 MG tablet Take 1 tablet by mouth daily. 30 tablet 3  . metFORMIN (GLUCOPHAGE) 500 MG tablet Take 1 tablet (500 mg total) by mouth daily with breakfast. 30 tablet 0  . Vitamin D, Ergocalciferol, (DRISDOL) 50000 units CAPS capsule Take 1 capsule (50,000 Units total) by mouth every 7 (seven) days. 4 capsule 0  .  warfarin (COUMADIN) 1 MG tablet One half tab daily 30 tablet 0  . warfarin (COUMADIN) 5 MG tablet Take 1 tablet (5 mg total) by mouth daily. 30 tablet 0   No current facility-administered medications on file prior to visit.     PAST MEDICAL HISTORY: Past Medical History:  Diagnosis Date  . Antiphospholipid antibody syndrome (HCC)   . Anxiety   . Arthritis   . Depression   . Endometriosis   . Endometriosis   . Fatigue   . Fibromyalgia   . GERD (gastroesophageal reflux disease)   . Heart murmur   . Hypertension   . IBS (irritable bowel syndrome)   . IBS (irritable bowel syndrome)   . Obesity   . Pulmonary embolism (HCC) 2004  . Shortness of breath   . Sleep apnea   . Vitamin D deficiency     PAST SURGICAL HISTORY: No past surgical history on file.  SOCIAL HISTORY: Social History   Tobacco Use  . Smoking status: Former Smoker    Last attempt to quit: 12/17/2008    Years since quitting: 8.3  . Smokeless tobacco: Never Used  Substance Use Topics  . Alcohol use: No  . Drug use: No    FAMILY HISTORY: Family History  Problem Relation Age of Onset  . Hyperlipidemia Mother   . Depression Mother   . Anxiety disorder Mother   . Obesity Mother   . Heart disease Father   .  Alcohol abuse Father   . Hypertension Father   . Cancer Father   . Alcoholism Father   . Obesity Father     ROS: Review of Systems  Constitutional: Positive for weight loss.  Respiratory: Negative for shortness of breath.   Cardiovascular: Negative for chest pain.  Gastrointestinal: Negative for nausea and vomiting.  Musculoskeletal:       Negative muscle weakness    PHYSICAL EXAM: Blood pressure 138/82, pulse 80, temperature 97.9 F (36.6 C), temperature source Oral, height 5\' 8"  (1.727 m), weight 268 lb (121.6 kg), SpO2 100 %. Body mass index is 40.75 kg/m. Physical Exam  Constitutional: She is oriented to person, place, and time. She appears well-developed and well-nourished.    Cardiovascular: Normal rate.  Pulmonary/Chest: Effort normal.  Musculoskeletal: Normal range of motion.  Neurological: She is oriented to person, place, and time.  Skin: Skin is warm and dry.  Psychiatric: She has a normal mood and affect. Her behavior is normal.  Vitals reviewed.   RECENT LABS AND TESTS: BMET    Component Value Date/Time   NA 140 02/16/2017 0949   K 4.6 02/16/2017 0949   CL 101 02/16/2017 0949   CO2 28 02/16/2017 0949   GLUCOSE 101 (H) 02/16/2017 0949   GLUCOSE 97 05/21/2016 1026   BUN 15 02/16/2017 0949   CREATININE 0.79 02/16/2017 0949   CREATININE 0.80 05/21/2016 1026   CALCIUM 9.3 02/16/2017 0949   GFRNONAA 93 02/16/2017 0949   GFRAA 107 02/16/2017 0949   Lab Results  Component Value Date   HGBA1C 5.7 (H) 02/16/2017   HGBA1C 5.5 11/12/2016   Lab Results  Component Value Date   INSULIN 9.0 02/16/2017   INSULIN 10.7 11/12/2016   CBC    Component Value Date/Time   WBC 5.9 11/12/2016 1128   WBC 5.6 05/21/2016 1026   RBC 4.61 11/12/2016 1128   RBC 4.72 05/21/2016 1026   HGB 13.7 11/12/2016 1128   HCT 41.2 11/12/2016 1128   PLT 297 11/12/2016 1128   MCV 89 11/12/2016 1128   MCH 29.7 11/12/2016 1128   MCH 28.8 05/21/2016 1026   MCHC 33.3 11/12/2016 1128   MCHC 32.4 05/21/2016 1026   RDW 13.1 11/12/2016 1128   LYMPHSABS 1.6 11/12/2016 1128   MONOABS 336 05/21/2016 1026   EOSABS 0.3 11/12/2016 1128   BASOSABS 0.0 11/12/2016 1128   Iron/TIBC/Ferritin/ %Sat No results found for: IRON, TIBC, FERRITIN, IRONPCTSAT Lipid Panel     Component Value Date/Time   CHOL 161 02/16/2017 0949   TRIG 60 02/16/2017 0949   HDL 38 (L) 02/16/2017 0949   CHOLHDL 3.9 05/21/2016 1026   VLDL 13 05/21/2016 1026   LDLCALC 111 (H) 02/16/2017 0949   Hepatic Function Panel     Component Value Date/Time   PROT 6.8 02/16/2017 0949   ALBUMIN 4.5 02/16/2017 0949   AST 16 02/16/2017 0949   ALT 17 02/16/2017 0949   ALKPHOS 92 02/16/2017 0949   BILITOT 0.5  02/16/2017 0949      Component Value Date/Time   TSH 2.140 11/12/2016 1128   TSH 2.06 05/21/2016 1026   TSH 1.959 02/24/2011 1132  Results for Ledford, Rorey A (MRN [id number]) as of 04/20/2017 10:28  Ref. Range 02/16/2017 09:49  Vitamin D, 25-Hydroxy Latest Ref Range: 30.0 - 100.0 ng/mL 43.9    ASSESSMENT AND PLAN: Vitamin D deficiency - Plan: Vitamin D, Ergocalciferol, (DRISDOL) 50000 units CAPS capsule  Essential hypertension  Class 3 severe obesity with serious comorbidity and  body mass index (BMI) of 40.0 to 44.9 in adult, unspecified obesity type (HCC)  PLAN:  Vitamin D Deficiency Lauren Burns was informed that low vitamin D levels contributes to fatigue and are associated with obesity, breast, and colon cancer. Lauren Burns agrees to continue taking prescription Vit D @50 ,000 IU every week #4 and we will refill for 1 month. She will follow up for routine testing of vitamin D, at least 2-3 times per year. She was informed of the risk of over-replacement of vitamin D and agrees to not increase her dose unless she discusses this with [place] first. Lauren Burns agrees to follow up with our clinic in 3 weeks.  Hypertension We discussed sodium restriction, working on healthy weight loss, and a regular exercise program as the means to achieve improved blood pressure control. Lauren Burns agreed with this plan and agreed to follow up as directed. We will continue to monitor her blood pressure as well as her progress with the above lifestyle modifications. She will continue her medications as prescribed and will watch for signs of hypotension as she continues her lifestyle modifications. Lauren Burns agrees to follow up with our clinic in 3 weeks.  Obesity Lauren Burns is currently in the action stage of change. As such, her goal is to continue with weight loss efforts She has agreed to keep a food journal with 1600 calories and 100 grams of protein daily Lauren Burns has been instructed to work up to a goal of 150 minutes of  combined cardio and strengthening exercise per week for weight loss and overall health benefits. We discussed the following Behavioral Modification Strategies today: increasing lean protein intake and work on meal planning and easy cooking plans   Lauren Burns has agreed to follow up with our clinic in 3 weeks. She was informed of the importance of frequent follow up visits to maximize her success with intensive lifestyle modifications for her multiple health conditions.   OBESITY BEHAVIORAL INTERVENTION VISIT  Today's visit was # 11 out of 22.  Starting weight: 320 lbs Starting date: 11/12/16 Today's weight : 268 lbs  Today's date: 04/20/2017 Total lbs lost to date: 12 (Patients must lose 7 lbs in the first 6 months to continue with counseling)   ASK: We discussed the diagnosis of obesity with Lauren Burns today and Lauren Burns agreed to give [place] permission to discuss obesity behavioral modification therapy today.  ASSESS: Lauren Burns has the diagnosis of obesity and her BMI today is 40.76 Lauren Burns is in the action stage of change   ADVISE: Lauren Burns was educated on the multiple health risks of obesity as well as the benefit of weight loss to improve her health. She was advised of the need for long term treatment and the importance of lifestyle modifications.  AGREE: Multiple dietary modification options and treatment options were discussed and  Lauren Burns agreed to the above obesity treatment plan.   Trude Mcburney, am acting as transcriptionist for Illa Level, PA-C I, Illa Level [facility], have reviewed this note and agree with its contents.

## 2017-04-21 ENCOUNTER — Telehealth: Payer: Self-pay | Admitting: Internal Medicine

## 2017-04-21 ENCOUNTER — Encounter: Payer: Self-pay | Admitting: Internal Medicine

## 2017-04-21 ENCOUNTER — Other Ambulatory Visit: Payer: Self-pay

## 2017-04-21 MED ORDER — DULOXETINE HCL 30 MG PO CPEP: 30.0000 mg | ORAL_CAPSULE | Freq: Every day | ORAL | 11 refills | Status: DC

## 2017-04-21 MED ORDER — WARFARIN SODIUM 1 MG PO TABS: ORAL_TABLET | ORAL | 0 refills | Status: DC

## 2017-04-21 NOTE — Telephone Encounter (Signed)
Refill Coumadin 1 mg #30 tabs She is taking 5.5 mg daily of Coumadin so this Rx should last ^) days

## 2017-05-11 ENCOUNTER — Ambulatory Visit (INDEPENDENT_AMBULATORY_CARE_PROVIDER_SITE_OTHER): Payer: BLUE CROSS/BLUE SHIELD | Admitting: Physician Assistant

## 2017-05-11 VITALS — BP 128/83 | HR 74 | Temp 98.0°F | Ht 68.0 in | Wt 262.0 lb

## 2017-05-11 DIAGNOSIS — E66812 Obesity, class 2: Secondary | ICD-10-CM

## 2017-05-11 DIAGNOSIS — R7303 Prediabetes: Secondary | ICD-10-CM | POA: Diagnosis not present

## 2017-05-11 DIAGNOSIS — I10 Essential (primary) hypertension: Secondary | ICD-10-CM

## 2017-05-11 DIAGNOSIS — Z9189 Other specified personal risk factors, not elsewhere classified: Secondary | ICD-10-CM | POA: Diagnosis not present

## 2017-05-11 DIAGNOSIS — Z6839 Body mass index (BMI) 39.0-39.9, adult: Secondary | ICD-10-CM

## 2017-05-11 MED ORDER — METFORMIN HCL 500 MG PO TABS: 500.0000 mg | ORAL_TABLET | Freq: Every day | ORAL | 0 refills | Status: DC

## 2017-05-11 NOTE — Progress Notes (Signed)
Office: [phone]  /  Fax: [phone]   HPI:   Chief Complaint: OBESITY Lauren Lauren Burns is here to discuss her progress with her obesity treatment plan. She is on the  keep a food journal with 1600 calories and 100 grams of protein daily and is following her eating plan approximately 96 % of the time. She states she is exercising 0 minutes 0 times per week. Lauren Lauren Burns continues to do well with weight loss. She keeps a strict food journal and states her hunger is well controlled. She is very pleased with her weight loss results. Her weight is 262 lb (118.8 kg) today and has had a weight loss of 6 pounds over a period of 3 weeks since her last visit. She has lost 58 lbs since starting treatment with us.  Pre-Diabetes Lauren Lauren Burns has a diagnosis of pre-diabetes based on her elevated Hgb A1c and was informed this puts her at greater risk of developing diabetes. She is taking Lauren Burns Lauren Burns and continues to work on diet and exercise to decrease risk of diabetes. She denies nausea, polyphagia or hypoglycemia.  At risk for diabetes Lauren Lauren Burns is at higher than average risk for developing diabetes due to her obesity and pre-diabetes. She Lauren Burns denies polyuria or polydipsia.  Hypertension Lauren Lauren Burns is a 42 y.o. female with hypertension. Lauren Lauren Burns denies chest pain or shortness of breath on exertion. She is working weight loss to help control her blood Lauren Burns with the goal of decreasing her risk of heart attack and stroke. Lauren Lauren Burns is stable.  ALLERGIES: No Known Allergies  MEDICATIONS: Current Outpatient Medications on File Prior to Visit  Medication Sig Dispense Refill  . buPROPion (WELLBUTRIN XL) 150 MG 24 hr tablet Take 1 tablet (150 mg total) by mouth daily. 30 tablet 5  . DULoxetine (CYMBALTA) 30 MG capsule Take 1 capsule (30 mg total) by mouth daily. 30 capsule 11  . losartan (COZAAR) 50 MG tablet Take 1 tablet (50 mg total) daily by mouth. 90 tablet 3  .  losartan-hydrochlorothiazide (HYZAAR) 100-25 MG tablet Take 1 tablet by mouth daily. 30 tablet 3  . Lauren Burns (GLUCOPHAGE) 500 MG tablet Take 1 tablet (500 mg total) by mouth daily with breakfast. 30 tablet 0  . Vitamin D, Ergocalciferol, (DRISDOL) 50000 units CAPS capsule Take 1 capsule (50,000 Units total) by mouth every 7 (seven) days. 4 capsule 0  . warfarin (COUMADIN) 1 MG tablet One half tab daily 30 tablet 0  . warfarin (COUMADIN) 5 MG tablet Take 1 tablet (5 mg total) by mouth daily. 30 tablet 0   No current facility-administered medications on file prior to visit.     PAST MEDICAL HISTORY: Past Medical History:  Diagnosis Date  . Antiphospholipid antibody syndrome (HCC)   . Anxiety   . Arthritis   . Depression   . Endometriosis   . Endometriosis   . Fatigue   . Fibromyalgia   . GERD (gastroesophageal reflux disease)   . Heart murmur   . Hypertension   . IBS (irritable bowel syndrome)   . IBS (irritable bowel syndrome)   . Obesity   . Pulmonary embolism (HCC) 2004  . Shortness of breath   . Sleep apnea   . Vitamin D deficiency     PAST SURGICAL HISTORY: No past surgical history on file.  SOCIAL HISTORY: Social History   Tobacco Use  . Smoking status: Former Smoker    Last attempt to quit: 12/17/2008    Years since quitting: 8.4  .  Smokeless tobacco: Never Used  Substance Use Topics  . Alcohol use: No  . Drug use: No    FAMILY HISTORY: Family History  Problem Relation Age of Onset  . Hyperlipidemia Mother   . Depression Mother   . Anxiety disorder Mother   . Obesity Mother   . Heart disease Father   . Alcohol abuse Father   . Hypertension Father   . Cancer Father   . Alcoholism Father   . Obesity Father     ROS: Review of Systems  Constitutional: Positive for weight loss.  Respiratory: Negative for shortness of breath (on exertion).   Cardiovascular: Negative for chest pain.  Gastrointestinal: Negative for nausea.  Endo/Heme/Allergies:        Negative for polyphagia Negative for hypoglycemia    PHYSICAL EXAM: Blood Lauren Burns 128/83, pulse 74, temperature 98 F (36.7 C), temperature source Oral, height 5\' 8"  (1.727 m), weight 262 lb (118.8 kg), SpO2 100 %. Body mass index is 39.84 kg/m. Physical Exam  Constitutional: She is oriented to person, place, and time. She appears well-developed and well-nourished.  Cardiovascular: Normal rate.  Pulmonary/Chest: Effort normal.  Musculoskeletal: Normal range of motion.  Neurological: She is oriented to person, place, and time.  Skin: Skin is warm and dry.  Psychiatric: She has a normal mood and affect. Her behavior is normal.  Vitals reviewed.   RECENT LABS AND TESTS: BMET    Component Value Date/Time   NA 140 02/16/2017 0949   K 4.6 02/16/2017 0949   CL 101 02/16/2017 0949   CO2 28 02/16/2017 0949   GLUCOSE 101 (H) 02/16/2017 0949   GLUCOSE 97 05/21/2016 1026   BUN 15 02/16/2017 0949   CREATININE 0.79 02/16/2017 0949   CREATININE 0.80 05/21/2016 1026   CALCIUM 9.3 02/16/2017 0949   GFRNONAA 93 02/16/2017 0949   GFRAA 107 02/16/2017 0949   Lab Results  Component Value Date   HGBA1C 5.7 (H) 02/16/2017   HGBA1C 5.5 11/12/2016   Lab Results  Component Value Date   INSULIN 9.0 02/16/2017   INSULIN 10.7 11/12/2016   CBC    Component Value Date/Time   WBC 5.9 11/12/2016 1128   WBC 5.6 05/21/2016 1026   RBC 4.61 11/12/2016 1128   RBC 4.72 05/21/2016 1026   HGB 13.7 11/12/2016 1128   HCT 41.2 11/12/2016 1128   PLT 297 11/12/2016 1128   MCV 89 11/12/2016 1128   MCH 29.7 11/12/2016 1128   MCH 28.8 05/21/2016 1026   MCHC 33.3 11/12/2016 1128   MCHC 32.4 05/21/2016 1026   RDW 13.1 11/12/2016 1128   LYMPHSABS 1.6 11/12/2016 1128   MONOABS 336 05/21/2016 1026   EOSABS 0.3 11/12/2016 1128   BASOSABS 0.0 11/12/2016 1128   Iron/TIBC/Ferritin/ %Sat No results found for: IRON, TIBC, FERRITIN, IRONPCTSAT Lipid Panel     Component Value Date/Time   CHOL 161  02/16/2017 0949   TRIG 60 02/16/2017 0949   HDL 38 (L) 02/16/2017 0949   CHOLHDL 3.9 05/21/2016 1026   VLDL 13 05/21/2016 1026   LDLCALC 111 (H) 02/16/2017 0949   Hepatic Function Panel     Component Value Date/Time   PROT 6.8 02/16/2017 0949   ALBUMIN 4.5 02/16/2017 0949   AST 16 02/16/2017 0949   ALT 17 02/16/2017 0949   ALKPHOS 92 02/16/2017 0949   BILITOT 0.5 02/16/2017 0949      Component Value Date/Time   TSH 2.140 11/12/2016 1128   TSH 2.06 05/21/2016 1026   TSH 1.959  02/24/2011 1132    ASSESSMENT AND PLAN: Prediabetes - Plan: Lauren Burns (GLUCOPHAGE) 500 MG tablet  Essential hypertension  At risk for diabetes mellitus  Class 2 severe obesity with serious comorbidity and body mass index (BMI) of 39.0 to 39.9 in adult, unspecified obesity type Lauren Lauren Burns)  PLAN:  Pre-Diabetes Lauren Lauren Burns continue to work on weight loss, exercise, and decreasing simple carbohydrates in her diet to help decrease the risk of diabetes. We dicussed Lauren Burns including benefits and risks. She was informed that eating too many simple carbohydrates or too many calories at one sitting increases the likelihood of GI side effects. Lauren Lauren Burns for now and a prescription was written today for 1 month refill. Aaira Lauren Burns to follow up with [place] as directed to monitor her progress.  Diabetes risk counseling Lauren Lauren Burns extended (15 minutes) diabetes prevention counseling today. She is 42 y.o. female and has risk factors for diabetes including obesity and pre-diabetes. We discussed intensive lifestyle modifications today with an emphasis on weight loss as well as increasing exercise and decreasing simple carbohydrates in her diet.  Hypertension We discussed sodium restriction, working on healthy weight loss, and a regular exercise program as the means to achieve improved blood Lauren Burns control. Lauren Lauren Burns with this plan and Lauren Burns to follow up as directed. We Lauren Burns continue to  monitor her blood Lauren Burns as well as her progress with the above lifestyle modifications. She Lauren Burns continue her medications as prescribed and Lauren Burns watch for signs of hypotension as she continues her lifestyle modifications.  Obesity Lauren Lauren Burns in the action stage of change. As such, her goal is to continue with weight loss efforts She has Lauren Burns to keep a food journal with 1600 calories and 100 grams of protein daily Lauren Lauren Burns has been instructed to work up to a goal of 150 minutes of combined cardio and strengthening exercise per week for weight loss and overall health benefits. We discussed the following Behavioral Modification Strategies today: increasing lean protein intake and keep a strict food journal  Lauren Lauren Burns has Lauren Burns to follow up with our clinic in 3 weeks. She was informed of the importance of frequent follow up visits to maximize her success with intensive lifestyle modifications for her multiple health conditions.   OBESITY BEHAVIORAL INTERVENTION VISIT  Today's visit was # 12 out of 22.  Starting weight: 320 lbs Starting date: 11/12/16 Today's weight : 262 lbs Today's date: 05/11/2017 Total lbs lost to date: 57 (Patients must lose 7 lbs in the first 6 months to continue with counseling)   ASK: We discussed the diagnosis of obesity with Lauren Bonito A Ellerson today and Margrit Lauren Burns to give [place] permission to discuss obesity behavioral modification therapy today.  ASSESS: Jaiana has the diagnosis of obesity and her BMI today is 39.85 Emonii is in the action stage of change   ADVISE: Saraya was educated on the multiple health risks of obesity as well as the benefit of weight loss to improve her health. She was advised of the need for long term treatment and the importance of lifestyle modifications.  AGREE: Multiple dietary modification options and treatment options were discussed and  Lauren Lauren Burns Lauren Burns to the above obesity treatment plan.   Cristi Loron, am  acting as transcriptionist for [employer], PA-C I, Ill[employer] Level [facility] Lauren Burns, have reviewed this note and agree with its content.

## 2017-05-21 ENCOUNTER — Other Ambulatory Visit: Payer: Self-pay

## 2017-05-21 MED ORDER — WARFARIN SODIUM 5 MG PO TABS: 5.0000 mg | ORAL_TABLET | Freq: Every day | ORAL | 0 refills | Status: DC

## 2017-05-27 ENCOUNTER — Other Ambulatory Visit: Payer: Self-pay

## 2017-05-27 MED ORDER — WARFARIN SODIUM 1 MG PO TABS: ORAL_TABLET | ORAL | 0 refills | Status: DC

## 2017-06-01 ENCOUNTER — Ambulatory Visit (INDEPENDENT_AMBULATORY_CARE_PROVIDER_SITE_OTHER): Payer: BLUE CROSS/BLUE SHIELD | Admitting: Physician Assistant

## 2017-06-01 VITALS — BP 116/77 | HR 79 | Temp 98.3°F | Ht 68.0 in | Wt 257.0 lb

## 2017-06-01 DIAGNOSIS — Z6839 Body mass index (BMI) 39.0-39.9, adult: Secondary | ICD-10-CM | POA: Diagnosis not present

## 2017-06-01 DIAGNOSIS — Z9189 Other specified personal risk factors, not elsewhere classified: Secondary | ICD-10-CM | POA: Diagnosis not present

## 2017-06-01 DIAGNOSIS — E7849 Other hyperlipidemia: Secondary | ICD-10-CM | POA: Diagnosis not present

## 2017-06-01 DIAGNOSIS — R7303 Prediabetes: Secondary | ICD-10-CM | POA: Diagnosis not present

## 2017-06-01 DIAGNOSIS — E559 Vitamin D deficiency, unspecified: Secondary | ICD-10-CM

## 2017-06-01 MED ORDER — METFORMIN HCL 500 MG PO TABS: 500.0000 mg | ORAL_TABLET | Freq: Two times a day (BID) | ORAL | 0 refills | Status: DC

## 2017-06-01 MED ORDER — VITAMIN D (ERGOCALCIFEROL) 1.25 MG (50000 UNIT) PO CAPS: 50000.0000 [IU] | ORAL_CAPSULE | ORAL | 0 refills | Status: DC

## 2017-06-01 NOTE — Progress Notes (Signed)
Office: [phone]  /  Fax: [phone]   HPI:   Chief Complaint: OBESITY Lauren Burns is here to discuss her progress with her obesity treatment plan. She is on the keep a food journal with 1600 calories and 100 grams of protein daily and is following her eating plan approximately 96-97 % of the time. She states she is doing free weights and 10,000 steps 6 times per week. Lauren Burns continues to do well with weight loss. She states she has been worried about her son and has had increase in cravings and hunger in the evening.  Her weight is 257 lb (116.6 kg) today and has had a weight loss of 5 pounds over a period of 3 weeks since her last visit. She has lost 63 lbs since starting treatment with [place].  Pre-Diabetes Lauren Burns has a diagnosis of pre-diabetes based on her elevated Hgb A1c and was informed this puts her at greater risk of developing diabetes. She is taking metformin currently and continues to work on diet and exercise to decrease risk of diabetes. She notes polyphagia in the evenings and denies nausea or hypoglycemia.  At risk for diabetes Lauren Burns is at higher than average risk for developing diabetes due to her obesity and pre-diabetes. She currently denies polyuria or polydipsia.  Hyperlipidemia Lauren Burns has hyperlipidemia and has been trying to improve her cholesterol levels with intensive lifestyle modification including a low saturated fat diet, exercise and weight loss. She is not on medications and denies any chest pain, claudication or myalgias.  Vitamin D Deficiency Lauren Burns has a diagnosis of vitamin D deficiency. She is currently taking prescription Vit D and denies nausea, vomiting or muscle weakness.  ALLERGIES: No Known Allergies  MEDICATIONS: Current Outpatient Medications on File Prior to Visit  Medication Sig Dispense Refill  . buPROPion (WELLBUTRIN XL) 150 MG 24 hr tablet Take 1 tablet (150 mg total) by mouth daily. 30 tablet 5  . DULoxetine (CYMBALTA) 30 MG  capsule Take 1 capsule (30 mg total) by mouth daily. 30 capsule 11  . losartan (COZAAR) 50 MG tablet Take 1 tablet (50 mg total) daily by mouth. 90 tablet 3  . losartan-hydrochlorothiazide (HYZAAR) 100-25 MG tablet Take 1 tablet by mouth daily. 30 tablet 3  . Vitamin D, Ergocalciferol, (DRISDOL) 50000 units CAPS capsule Take 1 capsule (50,000 Units total) by mouth every 7 (seven) days. 4 capsule 0  . warfarin (COUMADIN) 1 MG tablet One half tab daily 30 tablet 0  . warfarin (COUMADIN) 5 MG tablet Take 1 tablet (5 mg total) by mouth daily. 30 tablet 0   No current facility-administered medications on file prior to visit.     PAST MEDICAL HISTORY: Past Medical History:  Diagnosis Date  . Antiphospholipid antibody syndrome (HCC)   . Anxiety   . Arthritis   . Depression   . Endometriosis   . Endometriosis   . Fatigue   . Fibromyalgia   . GERD (gastroesophageal reflux disease)   . Heart murmur   . Hypertension   . IBS (irritable bowel syndrome)   . IBS (irritable bowel syndrome)   . Obesity   . Pulmonary embolism (HCC) 2004  . Shortness of breath   . Sleep apnea   . Vitamin D deficiency     PAST SURGICAL HISTORY: No past surgical history on file.  SOCIAL HISTORY: Social History   Tobacco Use  . Smoking status: Former Smoker    Last attempt to quit: 12/17/2008    Years since quitting: 8.4  .  Smokeless tobacco: Never Used  Substance Use Topics  . Alcohol use: No  . Drug use: No    FAMILY HISTORY: Family History  Problem Relation Age of Onset  . Hyperlipidemia Mother   . Depression Mother   . Anxiety disorder Mother   . Obesity Mother   . Heart disease Father   . Alcohol abuse Father   . Hypertension Father   . Cancer Father   . Alcoholism Father   . Obesity Father     ROS: Review of Systems  Constitutional: Positive for weight loss.  Cardiovascular: Negative for chest pain and claudication.  Gastrointestinal: Negative for nausea and vomiting.    Genitourinary: Negative for frequency.  Musculoskeletal: Negative for myalgias.       Negative muscle weakness  Endo/Heme/Allergies: Negative for polydipsia.       Positive polyphagia Negative hypoglycemia    PHYSICAL EXAM: Blood pressure 116/77, pulse 79, temperature 98.3 F (36.8 C), temperature source Oral, height 5\' 8"  (1.727 m), weight 257 lb (116.6 kg), SpO2 95 %. Body mass index is 39.08 kg/m. Physical Exam  Constitutional: She is oriented to person, place, and time. She appears well-developed and well-nourished.  Cardiovascular: Normal rate.  Pulmonary/Chest: Effort normal.  Musculoskeletal: Normal range of motion.  Neurological: She is oriented to person, place, and time.  Skin: Skin is warm and dry.  Psychiatric: She has a normal mood and affect. Her behavior is normal.  Vitals reviewed.   RECENT LABS AND TESTS: BMET    Component Value Date/Time   NA 140 02/16/2017 0949   K 4.6 02/16/2017 0949   CL 101 02/16/2017 0949   CO2 28 02/16/2017 0949   GLUCOSE 101 (H) 02/16/2017 0949   GLUCOSE 97 05/21/2016 1026   BUN 15 02/16/2017 0949   CREATININE 0.79 02/16/2017 0949   CREATININE 0.80 05/21/2016 1026   CALCIUM 9.3 02/16/2017 0949   GFRNONAA 93 02/16/2017 0949   GFRAA 107 02/16/2017 0949   Lab Results  Component Value Date   HGBA1C 5.7 (H) 02/16/2017   HGBA1C 5.5 11/12/2016   Lab Results  Component Value Date   INSULIN 9.0 02/16/2017   INSULIN 10.7 11/12/2016   CBC    Component Value Date/Time   WBC 5.9 11/12/2016 1128   WBC 5.6 05/21/2016 1026   RBC 4.61 11/12/2016 1128   RBC 4.72 05/21/2016 1026   HGB 13.7 11/12/2016 1128   HCT 41.2 11/12/2016 1128   PLT 297 11/12/2016 1128   MCV 89 11/12/2016 1128   MCH 29.7 11/12/2016 1128   MCH 28.8 05/21/2016 1026   MCHC 33.3 11/12/2016 1128   MCHC 32.4 05/21/2016 1026   RDW 13.1 11/12/2016 1128   LYMPHSABS 1.6 11/12/2016 1128   MONOABS 336 05/21/2016 1026   EOSABS 0.3 11/12/2016 1128   BASOSABS 0.0  11/12/2016 1128   Iron/TIBC/Ferritin/ %Sat No results found for: IRON, TIBC, FERRITIN, IRONPCTSAT Lipid Panel     Component Value Date/Time   CHOL 161 02/16/2017 0949   TRIG 60 02/16/2017 0949   HDL 38 (L) 02/16/2017 0949   CHOLHDL 3.9 05/21/2016 1026   VLDL 13 05/21/2016 1026   LDLCALC 111 (H) 02/16/2017 0949   Hepatic Function Panel     Component Value Date/Time   PROT 6.8 02/16/2017 0949   ALBUMIN 4.5 02/16/2017 0949   AST 16 02/16/2017 0949   ALT 17 02/16/2017 0949   ALKPHOS 92 02/16/2017 0949   BILITOT 0.5 02/16/2017 0949      Component Value Date/Time  TSH 2.140 11/12/2016 1128   TSH 2.06 05/21/2016 1026   TSH 1.959 02/24/2011 1132  Results for Withers, Monasia A (MRN [id number]) as of 06/01/2017 08:44  Ref. Range 02/16/2017 09:49  Vitamin D, 25-Hydroxy Latest Ref Range: 30.0 - 100.0 ng/mL 43.9    ASSESSMENT AND PLAN: Prediabetes - Plan: Comprehensive metabolic panel, Hemoglobin A1c, Insulin, random, metFORMIN (GLUCOPHAGE) 500 MG tablet  Other hyperlipidemia - Plan: Lipid Panel With LDL/HDL Ratio  Vitamin D deficiency - Plan: VITAMIN D 25 Hydroxy (Vit-D Deficiency, Fractures), Vitamin D, Ergocalciferol, (DRISDOL) 50000 units CAPS capsule  At risk for diabetes mellitus  Class 2 severe obesity with serious comorbidity and body mass index (BMI) of 39.0 to 39.9 in adult, unspecified obesity type (HCC)  PLAN:  Pre-Diabetes Tammera will continue to work on weight loss, exercise, and decreasing simple carbohydrates in her diet to help decrease the risk of diabetes. We dicussed metformin including benefits and risks. She was informed that eating too many simple carbohydrates or too many calories at one sitting increases the likelihood of GI side effects. Declan agrees to increase metformin to 500 mg BID #60 with no refills. We will check labs and  Lauren Burns agrees to follow up with our clinic in 2 to 3 weeks as directed to monitor her progress.  Diabetes risk  counselling Lauren Burns was given extended (15 minutes) diabetes prevention counseling today. She is 42 y.o. female and has risk factors for diabetes including obesity and pre-diabetes. We discussed intensive lifestyle modifications today with an emphasis on weight loss as well as increasing exercise and decreasing simple carbohydrates in her diet.  Hyperlipidemia Lauren Burns was informed of the American Heart Association Guidelines emphasizing intensive lifestyle modifications as the first line treatment for hyperlipidemia. We discussed many lifestyle modifications today in depth, and Lauren Burns will continue to work on decreasing saturated fats such as fatty red meat, butter and many fried foods. She will also increase vegetables and lean protein in her diet and continue to work on diet, exercise, and weight loss efforts. We will check labs and Lauren Burns agrees to follow up with our clinic in 2 to 3 weeks.  Vitamin D Deficiency Lauren Burns was informed that low vitamin D levels contributes to fatigue and are associated with obesity, breast, and colon cancer. Lauren Burns agrees to continue taking prescription Vit D @50 ,000 IU every week #4 and we will refill for 1 month. She will follow up for routine testing of vitamin D, at least 2-3 times per year. She was informed of the risk of over-replacement of vitamin D and agrees to not increase her dose unless she discusses this with [place] first. We will check labs and Lauren Burns agrees to follow up with our clinic in 2 to 3 weeks.  Obesity Lauren Burns is currently in the action stage of change. As such, her goal is to continue with weight loss efforts She has agreed to keep a food journal with 1600 calories and 100 grams of protein daily Lauren Burns has been instructed to work up to a goal of 150 minutes of combined cardio and strengthening exercise per week for weight loss and overall health benefits. We discussed the following Behavioral Modification Strategies today: increasing lean  protein intake and better snacking choices   Lauren Burns has agreed to follow up with our clinic in 2 to 3 weeks. She was informed of the importance of frequent follow up visits to maximize her success with intensive lifestyle modifications for her multiple health conditions.   OBESITY BEHAVIORAL INTERVENTION VISIT  Today's  visit was # 13 out of 22.  Starting weight: 320 lbs Starting date: 11/12/16 Today's weight : 257 lbs  Today's date: 06/01/2017 Total lbs lost to date: 6563  (Patients must lose 7 lbs in the first 6 months to continue with counseling)   ASK: We discussed the diagnosis of obesity with Lauren Burns today and Marybel agreed to give us permission to discuss obesity behavioral modification therapy today.  ASSESS: Lauren BonitoChristy has the diagnosis of obesity and her BMI today is 39.09 Lauren BonitoChristy is in the action stage of change   ADVISE: Lauren BonitoChristy was educated on the multiple health risks of obesity as well as the benefit of weight loss to improve her health. She was advised of the need for long term treatment and the importance of lifestyle modifications.  AGREE: Multiple dietary modification options and treatment options were discussed and  Keilee agreed to the above obesity treatment plan.   Trude McburneyI, Sharon Martin, am acting as transcriptionist for Illa LevelSahar Pietra Zuluaga, PA-C I, Illa LevelSahar Veldon [facility], have reviewed this note and agree with its content.

## 2017-06-03 LAB — LIPID PANEL WITH LDL/HDL RATIO
Cholesterol, Total: 189 mg/dL (ref 100–199)
HDL: 45 mg/dL (ref >39)
LDL Calculated: 128 mg/dL — ABNORMAL HIGH (ref 0–99)
LDL/HDL RATIO: 2.8 ratio (ref 0.0–3.2)
Triglycerides: 80 mg/dL (ref 0–149)
VLDL Cholesterol Cal: 16 mg/dL (ref 5–40)

## 2017-06-03 LAB — COMPREHENSIVE METABOLIC PANEL
A/G RATIO: 1.5 (ref 1.2–2.2)
ALK PHOS: 83 IU/L (ref 39–117)
ALT: 11 IU/L (ref 0–32)
AST: 14 IU/L (ref 0–40)
Albumin: 4.1 g/dL (ref 3.5–5.5)
BILIRUBIN TOTAL: 0.4 mg/dL (ref 0.0–1.2)
BUN/Creatinine Ratio: 21 (ref 9–23)
BUN: 16 mg/dL (ref 6–24)
CALCIUM: 9 mg/dL (ref 8.7–10.2)
CHLORIDE: 101 mmol/L (ref 96–106)
CO2: 25 mmol/L (ref 20–29)
Creatinine, Ser: 0.77 mg/dL (ref 0.57–1.00)
GFR calc Af Amer: 110 mL/min/{1.73_m2} (ref >59)
GFR calc non Af Amer: 96 mL/min/{1.73_m2} (ref >59)
Globulin, Total: 2.7 g/dL (ref 1.5–4.5)
Glucose: 84 mg/dL (ref 65–99)
POTASSIUM: 4.4 mmol/L (ref 3.5–5.2)
Sodium: 139 mmol/L (ref 134–144)
Total Protein: 6.8 g/dL (ref 6.0–8.5)

## 2017-06-03 LAB — INSULIN, RANDOM: INSULIN: 6.4 u[IU]/mL (ref 2.6–24.9)

## 2017-06-03 LAB — HEMOGLOBIN A1C
ESTIMATED AVERAGE GLUCOSE: 105 mg/dL
HEMOGLOBIN A1C: 5.3 % (ref 4.8–5.6)

## 2017-06-03 LAB — VITAMIN D 25 HYDROXY (VIT D DEFICIENCY, FRACTURES): VIT D 25 HYDROXY: 42.7 ng/mL (ref 30.0–100.0)

## 2017-06-17 ENCOUNTER — Other Ambulatory Visit: Payer: BLUE CROSS/BLUE SHIELD | Admitting: Internal Medicine

## 2017-06-17 ENCOUNTER — Ambulatory Visit: Payer: BLUE CROSS/BLUE SHIELD | Admitting: Internal Medicine

## 2017-06-17 ENCOUNTER — Encounter: Payer: Self-pay | Admitting: Internal Medicine

## 2017-06-17 VITALS — BP 120/80 | HR 77 | Ht 68.0 in | Wt 260.0 lb

## 2017-06-17 DIAGNOSIS — Z7901 Long term (current) use of anticoagulants: Secondary | ICD-10-CM | POA: Diagnosis not present

## 2017-06-17 DIAGNOSIS — D6861 Antiphospholipid syndrome: Secondary | ICD-10-CM | POA: Diagnosis not present

## 2017-06-17 LAB — PROTIME-INR
INR: 2.1 — ABNORMAL HIGH
PROTHROMBIN TIME: 22.1 s — AB (ref 9.0–11.5)

## 2017-06-17 MED ORDER — WARFARIN SODIUM 5 MG PO TABS: 5.0000 mg | ORAL_TABLET | Freq: Every day | ORAL | 0 refills | Status: DC

## 2017-06-17 NOTE — Progress Notes (Signed)
   Subjective:    Patient ID: Lauren Burns, female    DOB: 10/21/1975, [age] y.o.   MRN: [id number]  HPI Is continuing with weight loss program with Dr. Dalbert GarnetBeasley.  Currently on Coumadin 5.5 mg daily for antiphospholipid antibody syndrome.  Feels well with no complaints or problems.  No new complaints.  Coumadin 5 mg refilled today for 30 days    Review of Systems     Objective:   Physical Exam  Not examined.  Spent 15 minutes speaking with her.  Pro time INR is 2.1      Assessment & Plan:  Chronic anticoagulation with therapeutic pro time INR  Antiphospholipid antibody syndrome  Plan: Return in 8 weeks and continue Coumadin 5.5 mg daily

## 2017-06-17 NOTE — Patient Instructions (Signed)
Continue Coumadin 5.5 mg daily and follow-up in 8 weeks with office visit and pro time INR

## 2017-06-17 NOTE — Addendum Note (Signed)
Addended by: Gregery NaVALENCIA, Jeannelle Wiens P on: 06/17/2017 09:00 AM   Modules accepted: Orders

## 2017-06-22 ENCOUNTER — Ambulatory Visit (INDEPENDENT_AMBULATORY_CARE_PROVIDER_SITE_OTHER): Payer: BLUE CROSS/BLUE SHIELD | Admitting: Physician Assistant

## 2017-06-22 VITALS — BP 126/77 | HR 72 | Temp 98.1°F | Ht 68.0 in | Wt 253.0 lb

## 2017-06-22 DIAGNOSIS — E669 Obesity, unspecified: Secondary | ICD-10-CM

## 2017-06-22 DIAGNOSIS — E559 Vitamin D deficiency, unspecified: Secondary | ICD-10-CM

## 2017-06-22 DIAGNOSIS — F3289 Other specified depressive episodes: Secondary | ICD-10-CM

## 2017-06-22 DIAGNOSIS — Z9189 Other specified personal risk factors, not elsewhere classified: Secondary | ICD-10-CM

## 2017-06-22 DIAGNOSIS — Z6833 Body mass index (BMI) 33.0-33.9, adult: Secondary | ICD-10-CM | POA: Diagnosis not present

## 2017-06-22 MED ORDER — BUPROPION HCL ER (XL) 150 MG PO TB24: 150.0000 mg | ORAL_TABLET | Freq: Every day | ORAL | 5 refills | Status: DC

## 2017-06-22 MED ORDER — VITAMIN D (ERGOCALCIFEROL) 1.25 MG (50000 UNIT) PO CAPS: 50000.0000 [IU] | ORAL_CAPSULE | ORAL | 0 refills | Status: DC

## 2017-06-22 NOTE — Progress Notes (Signed)
Office: [phone]  /  Fax: [phone]   HPI:   Chief Complaint: OBESITY Lauren Burns is here to discuss her progress with her obesity treatment plan. She is on the keep a food journal with 1600 calories and 100 grams of protein daily and is following her eating plan approximately 98 % of the time. She states she is exercising 0 minutes 0 times per week. Lauren Burns continues to do well with weight loss. She has had increase in cravings but manages to stay on track with her eating.  Her weight is 253 lb (114.8 kg) today and has had a weight loss of 4 pounds over a period of 3 weeks since her last visit. She has lost 67 lbs since starting treatment with [place].  Vitamin D Deficiency Lauren Burns has a diagnosis of vitamin D deficiency. She is currently taking prescription Vit D and denies nausea, vomiting or muscle weakness.  Lauren risk for osteopenia and osteoporosis Lauren Burns is Lauren higher risk of osteopenia and osteoporosis due to vitamin D deficiency.   Depression with emotional eating behaviors Lauren Burns is struggling with emotional eating and using food for comfort to the extent that it is negatively impacting her health. She often snacks when she is not hungry. Lauren Burns sometimes feels she is out of control and then feels guilty that she made poor food choices. She has been working on behavior modification techniques to help reduce her emotional eating and has been somewhat successful. Her mood is stable and she shows no sign of suicidal or homicidal ideations.  Depression screen PHQ 2/9 11/12/2016  Decreased Interest 1  Down, Depressed, Hopeless 1  PHQ - 2 Score 2  Altered sleeping 2  Tired, decreased energy 3  Change in appetite 1  Feeling bad or failure about yourself  1  Trouble concentrating 1  Moving slowly or fidgety/restless 0  PHQ-9 Score 10   ALLERGIES: No Known Allergies  MEDICATIONS: Current Outpatient Medications on File Prior to Visit  Medication Sig Dispense Refill  . buPROPion  (WELLBUTRIN XL) 150 MG 24 hr tablet Take 1 tablet (150 mg total) by mouth daily. 30 tablet 5  . DULoxetine (CYMBALTA) 30 MG capsule Take 1 capsule (30 mg total) by mouth daily. 30 capsule 11  . losartan (COZAAR) 50 MG tablet Take 1 tablet (50 mg total) daily by mouth. 90 tablet 3  . losartan-hydrochlorothiazide (HYZAAR) 100-25 MG tablet Take 1 tablet by mouth daily. 30 tablet 3  . metFORMIN (GLUCOPHAGE) 500 MG tablet Take 1 tablet (500 mg total) by mouth 2 (two) times daily with a meal. 60 tablet 0  . Vitamin D, Ergocalciferol, (DRISDOL) 50000 units CAPS capsule Take 1 capsule (50,000 Units total) by mouth every 7 (seven) days. 4 capsule 0  . warfarin (COUMADIN) 1 MG tablet One half tab daily 30 tablet 0  . warfarin (COUMADIN) 5 MG tablet Take 1 tablet (5 mg total) by mouth daily. 30 tablet 0   No current facility-administered medications on file prior to visit.     PAST MEDICAL HISTORY: Past Medical History:  Diagnosis Date  . Antiphospholipid antibody syndrome (HCC)   . Anxiety   . Arthritis   . Depression   . Endometriosis   . Endometriosis   . Fatigue   . Fibromyalgia   . GERD (gastroesophageal reflux disease)   . Heart murmur   . Hypertension   . IBS (irritable bowel syndrome)   . IBS (irritable bowel syndrome)   . Obesity   . Pulmonary embolism (HCC)  2004  . Shortness of breath   . Sleep apnea   . Vitamin D deficiency     PAST SURGICAL HISTORY: No past surgical history on file.  SOCIAL HISTORY: Social History   Tobacco Use  . Smoking status: Former Smoker    Last attempt to quit: 12/17/2008    Years since quitting: 8.5  . Smokeless tobacco: Never Used  Substance Use Topics  . Alcohol use: No  . Drug use: No    FAMILY HISTORY: Family History  Problem Relation Age of Onset  . Hyperlipidemia Mother   . Depression Mother   . Anxiety disorder Mother   . Obesity Mother   . Heart disease Father   . Alcohol abuse Father   . Hypertension Father   . Cancer  Father   . Alcoholism Father   . Obesity Father     ROS: Review of Systems  Constitutional: Positive for weight loss.  Gastrointestinal: Negative for nausea and vomiting.  Musculoskeletal:       Negative muscle weakness  Psychiatric/Behavioral: Positive for depression. Negative for suicidal ideas.    PHYSICAL EXAM: Blood pressure 126/77, pulse 72, temperature 98.1 F (36.7 C), temperature source Oral, height 5\' 8"  (1.727 m), weight 253 lb (114.8 kg), SpO2 100 %. Body mass index is 38.47 kg/m. Physical Exam  Constitutional: She is oriented to person, place, and time. She appears well-developed and well-nourished.  Cardiovascular: Normal rate.  Pulmonary/Chest: Effort normal.  Musculoskeletal: Normal range of motion.  Neurological: She is oriented to person, place, and time.  Skin: Skin is warm and dry.  Psychiatric: She has a normal mood and affect. Her behavior is normal.  Vitals reviewed.   RECENT LABS AND TESTS: BMET    Component Value Date/Time   NA 139 06/01/2017 0833   K 4.4 06/01/2017 0833   CL 101 06/01/2017 0833   CO2 25 06/01/2017 0833   GLUCOSE 84 06/01/2017 0833   GLUCOSE 97 05/21/2016 1026   BUN 16 06/01/2017 0833   CREATININE 0.77 06/01/2017 0833   CREATININE 0.80 05/21/2016 1026   CALCIUM 9.0 06/01/2017 0833   GFRNONAA 96 06/01/2017 0833   GFRAA 110 06/01/2017 0833   Lab Results  Component Value Date   HGBA1C 5.3 06/01/2017   HGBA1C 5.7 (H) 02/16/2017   HGBA1C 5.5 11/12/2016   Lab Results  Component Value Date   INSULIN 6.4 06/01/2017   INSULIN 9.0 02/16/2017   INSULIN 10.7 11/12/2016   CBC    Component Value Date/Time   WBC 5.9 11/12/2016 1128   WBC 5.6 05/21/2016 1026   RBC 4.61 11/12/2016 1128   RBC 4.72 05/21/2016 1026   HGB 13.7 11/12/2016 1128   HCT 41.2 11/12/2016 1128   PLT 297 11/12/2016 1128   MCV 89 11/12/2016 1128   MCH 29.7 11/12/2016 1128   MCH 28.8 05/21/2016 1026   MCHC 33.3 11/12/2016 1128   MCHC 32.4 05/21/2016  1026   RDW 13.1 11/12/2016 1128   LYMPHSABS 1.6 11/12/2016 1128   MONOABS 336 05/21/2016 1026   EOSABS 0.3 11/12/2016 1128   BASOSABS 0.0 11/12/2016 1128   Iron/TIBC/Ferritin/ %Sat No results found for: IRON, TIBC, FERRITIN, IRONPCTSAT Lipid Panel     Component Value Date/Time   CHOL 189 06/01/2017 0833   TRIG 80 06/01/2017 0833   HDL 45 06/01/2017 0833   CHOLHDL 3.9 05/21/2016 1026   VLDL 13 05/21/2016 1026   LDLCALC 128 (H) 06/01/2017 0833   Hepatic Function Panel     Component  Value Date/Time   PROT 6.8 06/01/2017 0833   ALBUMIN 4.1 06/01/2017 0833   AST 14 06/01/2017 0833   ALT 11 06/01/2017 0833   ALKPHOS 83 06/01/2017 0833   BILITOT 0.4 06/01/2017 0833      Component Value Date/Time   TSH 2.140 11/12/2016 1128   TSH 2.06 05/21/2016 1026   TSH 1.959 02/24/2011 1132  Results for Wentland, Samiha A (MRN [id number]) as of 06/22/2017 09:24  Ref. Range 06/01/2017 08:33  Vitamin D, 25-Hydroxy Latest Ref Range: 30.0 - 100.0 ng/mL 42.7    ASSESSMENT AND PLAN: Vitamin D deficiency - Plan: Vitamin D, Ergocalciferol, (DRISDOL) 50000 units CAPS capsule  Other depression - with emotional eating - Plan: buPROPion (WELLBUTRIN XL) 150 MG 24 hr tablet  Lauren risk for osteoporosis  Class 1 obesity with serious comorbidity and body mass index (BMI) of 33.0 to 33.9 in adult, unspecified obesity type  PLAN:  Vitamin D Deficiency Lauren Burns was informed that low vitamin D levels contributes to fatigue and are associated with obesity, breast, and colon cancer. Lauren Burns agrees to continue taking prescription Vit D @50 ,000 IU every week #4 and we will refill for 1 month. She will follow up for routine testing of vitamin D, Lauren least 2-3 times per year. She was informed of the risk of over-replacement of vitamin D and agrees to not increase her dose unless she discusses this with [place] first. Lauren Burns agrees to follow up with our clinic in 3 weeks.  Lauren risk for osteopenia and osteoporosis Lauren Burns is  Lauren risk for osteopenia and osteoporsis due to her vitamin D deficiency. She was encouraged to take her vitamin D and follow her higher calcium diet and increase strengthening exercise to help strengthen her bones and decrease her risk of osteopenia and osteoporosis.  Depression with Emotional Eating Behaviors We discussed behavior modification techniques today to help Lauren Burns deal with her emotional eating and depression. Lauren Burns agrees to increase Wellbutrin SR to 200 mg qd #30 and we will refill for 1 month. Lauren Burns agrees to follow up with our clinic in 3 weeks.  Obesity Lauren Burns is currently in the action stage of change. As such, her goal is to continue with weight loss efforts She has agreed to keep a food journal with 1600 calories and 100 grams of protein daily Lauren Burns has been instructed to work up to a goal of 150 minutes of combined cardio and strengthening exercise per week for weight loss and overall health benefits. We discussed the following Behavioral Modification Strategies today: increasing lean protein intake and emotional eating strategies   Lauren Burns has agreed to follow up with our clinic in 3 weeks. She was informed of the importance of frequent follow up visits to maximize her success with intensive lifestyle modifications for her multiple health conditions.   OBESITY BEHAVIORAL INTERVENTION VISIT  Today's visit was # 14 out of 22.  Starting weight: 320 lbs Starting date: 11/12/16 Today's weight : 253 lbs Today's date: 06/22/2017 Total lbs lost to date: 48 (Patients must lose 7 lbs in the first 6 months to continue with counseling)   ASK: We discussed the diagnosis of obesity with Lauren Burns today and Lauren Burns agreed to give [place] permission to discuss obesity behavioral modification therapy today.  ASSESS: Lauren Burns has the diagnosis of obesity and her BMI today is 38.48 Darnesha is in the action stage of change   ADVISE: Lauren Burns was educated on the multiple  health risks of obesity as well as the benefit of weight  loss to improve her health. She was advised of the need for long term treatment and the importance of lifestyle modifications.  AGREE: Multiple dietary modification options and treatment options were discussed and  Lauren Burns agreed to the above obesity treatment plan.   Trude McburneyI, Sharon Martin, am acting as transcriptionist for Illa LevelSahar Osman, PA-C I, Illa LevelSahar Osman [facility], have reviewed this note and agree with its content

## 2017-07-13 ENCOUNTER — Ambulatory Visit (INDEPENDENT_AMBULATORY_CARE_PROVIDER_SITE_OTHER): Payer: BLUE CROSS/BLUE SHIELD | Admitting: Physician Assistant

## 2017-07-13 VITALS — BP 127/79 | HR 67 | Temp 98.2°F | Ht 68.0 in | Wt 248.0 lb

## 2017-07-13 DIAGNOSIS — Z6837 Body mass index (BMI) 37.0-37.9, adult: Secondary | ICD-10-CM

## 2017-07-13 DIAGNOSIS — R7303 Prediabetes: Secondary | ICD-10-CM

## 2017-07-13 DIAGNOSIS — Z9189 Other specified personal risk factors, not elsewhere classified: Secondary | ICD-10-CM | POA: Diagnosis not present

## 2017-07-13 DIAGNOSIS — E559 Vitamin D deficiency, unspecified: Secondary | ICD-10-CM | POA: Diagnosis not present

## 2017-07-13 MED ORDER — METFORMIN HCL 500 MG PO TABS: 500.0000 mg | ORAL_TABLET | Freq: Two times a day (BID) | ORAL | 0 refills | Status: DC

## 2017-07-13 MED ORDER — VITAMIN D (ERGOCALCIFEROL) 1.25 MG (50000 UNIT) PO CAPS: 50000.0000 [IU] | ORAL_CAPSULE | ORAL | 0 refills | Status: DC

## 2017-07-13 NOTE — Progress Notes (Signed)
Office: [phone]  /  Fax: [phone]   HPI:   Chief Complaint: OBESITY Lauren Burns is here to discuss her progress with her obesity treatment plan. She is on the keep a food journal with 1600 calories and 100 grams of protein daily and is following her eating plan approximately 95 % of the time. She states she is exercising 0 minutes 0 times per week. Dreamer continues to do well with weight loss. She prepares her meals and keeps a strict food journal. She would like vacation eating strategies.  Her weight is 248 lb (112.5 kg) today and has had a weight loss of 5 pounds over a period of 2 to 3 weeks since her last visit. She has lost 72 lbs since starting treatment with [place].  Vitamin D Deficiency Lauren Burns has a diagnosis of vitamin D deficiency. She is currently taking prescription Vit D and denies nausea, vomiting or muscle weakness.  Pre-Diabetes Lauren Burns has a diagnosis of pre-diabetes based on her elevated Hgb A1c and was informed this puts her at greater risk of developing diabetes. She is taking metformin currently and continues to work on diet and exercise to decrease risk of diabetes. She denies polyphagia, nausea or hypoglycemia.  At risk for diabetes Lauren Burns is at higher than average risk for developing diabetes due to her obesity and pre-diabetes. She currently denies polyuria or polydipsia.  ALLERGIES: No Known Allergies  MEDICATIONS: Current Outpatient Medications on File Prior to Visit  Medication Sig Dispense Refill  . buPROPion (WELLBUTRIN XL) 150 MG 24 hr tablet Take 1 tablet (150 mg total) by mouth daily. 30 tablet 5  . DULoxetine (CYMBALTA) 30 MG capsule Take 1 capsule (30 mg total) by mouth daily. 30 capsule 11  . losartan (COZAAR) 50 MG tablet Take 1 tablet (50 mg total) daily by mouth. 90 tablet 3  . losartan-hydrochlorothiazide (HYZAAR) 100-25 MG tablet Take 1 tablet by mouth daily. 30 tablet 3  . warfarin (COUMADIN) 1 MG tablet One half tab daily 30 tablet 0    . warfarin (COUMADIN) 5 MG tablet Take 1 tablet (5 mg total) by mouth daily. 30 tablet 0   No current facility-administered medications on file prior to visit.     PAST MEDICAL HISTORY: Past Medical History:  Diagnosis Date  . Antiphospholipid antibody syndrome (HCC)   . Anxiety   . Arthritis   . Depression   . Endometriosis   . Endometriosis   . Fatigue   . Fibromyalgia   . GERD (gastroesophageal reflux disease)   . Heart murmur   . Hypertension   . IBS (irritable bowel syndrome)   . IBS (irritable bowel syndrome)   . Obesity   . Pulmonary embolism (HCC) 2004  . Shortness of breath   . Sleep apnea   . Vitamin D deficiency     PAST SURGICAL HISTORY: No past surgical history on file.  SOCIAL HISTORY: Social History   Tobacco Use  . Smoking status: Former Smoker    Last attempt to quit: 12/17/2008    Years since quitting: 8.5  . Smokeless tobacco: Never Used  Substance Use Topics  . Alcohol use: No  . Drug use: No    FAMILY HISTORY: Family History  Problem Relation Age of Onset  . Hyperlipidemia Mother   . Depression Mother   . Anxiety disorder Mother   . Obesity Mother   . Heart disease Father   . Alcohol abuse Father   . Hypertension Father   . Cancer Father   .  Alcoholism Father   . Obesity Father     ROS: Review of Systems  Constitutional: Positive for weight loss.  Gastrointestinal: Negative for nausea and vomiting.  Genitourinary: Negative for frequency.  Musculoskeletal:       Negative muscle weakness  Endo/Heme/Allergies: Negative for polydipsia.       Negative polyphagia Negative hypoglycemia    PHYSICAL EXAM: Blood pressure 127/79, pulse 67, temperature 98.2 F (36.8 C), height 5\' 8"  (1.727 m), weight 248 lb (112.5 kg), SpO2 100 %. Body mass index is 37.71 kg/m. Physical Exam  Constitutional: She is oriented to person, place, and time. She appears well-developed and well-nourished.  Cardiovascular: Normal rate.  Pulmonary/Chest:  Effort normal.  Musculoskeletal: Normal range of motion.  Neurological: She is oriented to person, place, and time.  Skin: Skin is warm and dry.  Psychiatric: She has a normal mood and affect. Her behavior is normal.  Vitals reviewed.   RECENT LABS AND TESTS: BMET    Component Value Date/Time   NA 139 06/01/2017 0833   K 4.4 06/01/2017 0833   CL 101 06/01/2017 0833   CO2 25 06/01/2017 0833   GLUCOSE 84 06/01/2017 0833   GLUCOSE 97 05/21/2016 1026   BUN 16 06/01/2017 0833   CREATININE 0.77 06/01/2017 0833   CREATININE 0.80 05/21/2016 1026   CALCIUM 9.0 06/01/2017 0833   GFRNONAA 96 06/01/2017 0833   GFRAA 110 06/01/2017 0833   Lab Results  Component Value Date   HGBA1C 5.3 06/01/2017   HGBA1C 5.7 (H) 02/16/2017   HGBA1C 5.5 11/12/2016   Lab Results  Component Value Date   INSULIN 6.4 06/01/2017   INSULIN 9.0 02/16/2017   INSULIN 10.7 11/12/2016   CBC    Component Value Date/Time   WBC 5.9 11/12/2016 1128   WBC 5.6 05/21/2016 1026   RBC 4.61 11/12/2016 1128   RBC 4.72 05/21/2016 1026   HGB 13.7 11/12/2016 1128   HCT 41.2 11/12/2016 1128   PLT 297 11/12/2016 1128   MCV 89 11/12/2016 1128   MCH 29.7 11/12/2016 1128   MCH 28.8 05/21/2016 1026   MCHC 33.3 11/12/2016 1128   MCHC 32.4 05/21/2016 1026   RDW 13.1 11/12/2016 1128   LYMPHSABS 1.6 11/12/2016 1128   MONOABS 336 05/21/2016 1026   EOSABS 0.3 11/12/2016 1128   BASOSABS 0.0 11/12/2016 1128   Iron/TIBC/Ferritin/ %Sat No results found for: IRON, TIBC, FERRITIN, IRONPCTSAT Lipid Panel     Component Value Date/Time   CHOL 189 06/01/2017 0833   TRIG 80 06/01/2017 0833   HDL 45 06/01/2017 0833   CHOLHDL 3.9 05/21/2016 1026   VLDL 13 05/21/2016 1026   LDLCALC 128 (H) 06/01/2017 0833   Hepatic Function Panel     Component Value Date/Time   PROT 6.8 06/01/2017 0833   ALBUMIN 4.1 06/01/2017 0833   AST 14 06/01/2017 0833   ALT 11 06/01/2017 0833   ALKPHOS 83 06/01/2017 0833   BILITOT 0.4 06/01/2017  0833      Component Value Date/Time   TSH 2.140 11/12/2016 1128   TSH 2.06 05/21/2016 1026   TSH 1.959 02/24/2011 1132  Results for Knipp, Azaria A (MRN [id number]) as of 07/13/2017 09:52  Ref. Range 06/01/2017 08:33  Vitamin D, 25-Hydroxy Latest Ref Range: 30.0 - 100.0 ng/mL 42.7    ASSESSMENT AND PLAN: Vitamin D deficiency - Plan: Vitamin D, Ergocalciferol, (DRISDOL) 50000 units CAPS capsule  Prediabetes - Plan: metFORMIN (GLUCOPHAGE) 500 MG tablet  At risk for diabetes mellitus  Class 2  severe obesity with serious comorbidity and body mass index (BMI) of 37.0 to 37.9 in adult, unspecified obesity type (HCC)  PLAN:  Vitamin D Deficiency Neysa BonitoChristy was informed that low vitamin D levels contributes to fatigue and are associated with obesity, breast, and colon cancer. Neysa BonitoChristy agrees to continue taking prescription Vit D @50 ,000 IU every week #4 and we will refill for 1 month. She will follow up for routine testing of vitamin D, at least 2-3 times per year. She was informed of the risk of over-replacement of vitamin D and agrees to not increase her dose unless she discusses this with us first. Neysa BonitoChristy agrees to follow up with our clinic in 3 weeks.  Pre-Diabetes Neysa BonitoChristy will continue to work on weight loss, exercise, and decreasing simple carbohydrates in her diet to help decrease the risk of diabetes. We dicussed metformin including benefits and risks. She was informed that eating too many simple carbohydrates or too many calories at one sitting increases the likelihood of GI side effects. Neysa BonitoChristy agrees to continue taking metformin 500 mg BID #60 and we will refill for 1 month. Neysa BonitoChristy agrees to follow up with our clinic in 3 weeks as directed to monitor her progress.  Diabetes risk counselling Neysa BonitoChristy was given extended (15 minutes) diabetes prevention counseling today. She is 42 y.o. female and has risk factors for diabetes including obesity and pre-diabetes. We discussed intensive  lifestyle modifications today with an emphasis on weight loss as well as increasing exercise and decreasing simple carbohydrates in her diet.  Obesity Neysa BonitoChristy is currently in the action stage of change. As such, her goal is to continue with weight loss efforts She has agreed to keep a food journal with 1600 calories and 100 grams of protein daily Neysa BonitoChristy has been instructed to work up to a goal of 150 minutes of combined cardio and strengthening exercise per week for weight loss and overall health benefits. We discussed the following Behavioral Modification Strategies today: increasing lean protein intake and travel eating strategies   Neysa BonitoChristy has agreed to follow up with our clinic in 3 weeks. She was informed of the importance of frequent follow up visits to maximize her success with intensive lifestyle modifications for her multiple health conditions.   OBESITY BEHAVIORAL INTERVENTION VISIT  Today's visit was # 15 out of 22.  Starting weight: 320 lbs Starting date: 11/12/16 Today's weight : 248 lbs  Today's date: 07/13/2017 Total lbs lost to date: 3872 (Patients must lose 7 lbs in the first 6 months to continue with counseling)   ASK: We discussed the diagnosis of obesity with Neysa Bonitohristy A Fuhrman today and Roisin agreed to give us permission to discuss obesity behavioral modification therapy today.  ASSESS: Neysa BonitoChristy has the diagnosis of obesity and her BMI today is 37.72 Neysa BonitoChristy is in the action stage of change   ADVISE: Neysa BonitoChristy was educated on the multiple health risks of obesity as well as the benefit of weight loss to improve her health. She was advised of the need for long term treatment and the importance of lifestyle modifications.  AGREE: Multiple dietary modification options and treatment options were discussed and  Adana agreed to the above obesity treatment plan.   Trude McburneyI, Sharon Martin, am acting as transcriptionist for Illa LevelSahar Jamaul Heist, PA-C I, Illa LevelSahar Mishaal [facility], have reviewed  this note and agree with its contents

## 2017-07-14 ENCOUNTER — Other Ambulatory Visit: Payer: Self-pay | Admitting: Internal Medicine

## 2017-07-14 DIAGNOSIS — F3289 Other specified depressive episodes: Secondary | ICD-10-CM

## 2017-07-19 ENCOUNTER — Other Ambulatory Visit: Payer: Self-pay | Admitting: Internal Medicine

## 2017-07-28 ENCOUNTER — Other Ambulatory Visit: Payer: Self-pay

## 2017-07-28 DIAGNOSIS — Z7901 Long term (current) use of anticoagulants: Secondary | ICD-10-CM

## 2017-08-02 ENCOUNTER — Ambulatory Visit (INDEPENDENT_AMBULATORY_CARE_PROVIDER_SITE_OTHER): Payer: BLUE CROSS/BLUE SHIELD | Admitting: Physician Assistant

## 2017-08-02 VITALS — BP 120/80 | HR 71 | Temp 98.2°F | Ht 68.0 in | Wt 246.0 lb

## 2017-08-02 DIAGNOSIS — Z9189 Other specified personal risk factors, not elsewhere classified: Secondary | ICD-10-CM

## 2017-08-02 DIAGNOSIS — Z6837 Body mass index (BMI) 37.0-37.9, adult: Secondary | ICD-10-CM | POA: Diagnosis not present

## 2017-08-02 DIAGNOSIS — E559 Vitamin D deficiency, unspecified: Secondary | ICD-10-CM

## 2017-08-02 DIAGNOSIS — F3289 Other specified depressive episodes: Secondary | ICD-10-CM | POA: Diagnosis not present

## 2017-08-02 MED ORDER — BUPROPION HCL ER (SR) 200 MG PO TB12: 200.0000 mg | ORAL_TABLET | Freq: Every day | ORAL | 0 refills | Status: DC

## 2017-08-02 MED ORDER — VITAMIN D (ERGOCALCIFEROL) 1.25 MG (50000 UNIT) PO CAPS: 50000.0000 [IU] | ORAL_CAPSULE | ORAL | 0 refills | Status: DC

## 2017-08-03 NOTE — Progress Notes (Signed)
Office: [phone]  /  Fax: [phone]   HPI:   Chief Complaint: OBESITY Lauren Burns is here to discuss her progress with her obesity treatment plan. She is on the keep a food journal with 1600 calories and 100 grams of protein daily and is following her eating plan approximately 95 % of the time. She states she is exercising 0 minutes 0 times per week. Lauren Burns continues to do well with weight loss. She has had increase in cravings over the past 3 weeks.  Her weight is 246 lb (111.6 kg) today and has had a weight loss of 2 pounds over a period of 3 weeks since her last visit. She has lost 74 lbs since starting treatment with [place].  Vitamin D Deficiency Lauren Burns has a diagnosis of vitamin D deficiency. She is currently taking prescription Vit D and denies nausea, vomiting or muscle weakness.  At risk for osteopenia and osteoporosis Lauren Burns is at higher risk of osteopenia and osteoporosis due to vitamin D deficiency.   Depression with emotional eating behaviors Lauren Burns has had increase in cravings, her mood is stable. Lauren Burns struggles with emotional eating and using food for comfort to the extent that it is negatively impacting her health. She often snacks when she is not hungry. Lauren Burns sometimes feels she is out of control and then feels guilty that she made poor food choices. She has been working on behavior modification techniques to help reduce her emotional eating and has been somewhat successful. She shows no sign of suicidal or homicidal ideations.  Depression screen PHQ 2/9 11/12/2016  Decreased Interest 1  Down, Depressed, Hopeless 1  PHQ - 2 Score 2  Altered sleeping 2  Tired, decreased energy 3  Change in appetite 1  Feeling bad or failure about yourself  1  Trouble concentrating 1  Moving slowly or fidgety/restless 0  PHQ-9 Score 10   ALLERGIES: No Known Allergies  MEDICATIONS: Current Outpatient Medications on File Prior to Visit  Medication Sig Dispense Refill  .  DULoxetine (CYMBALTA) 30 MG capsule Take 1 capsule (30 mg total) by mouth daily. 30 capsule 11  . losartan (COZAAR) 50 MG tablet Take 1 tablet (50 mg total) daily by mouth. 90 tablet 3  . losartan-hydrochlorothiazide (HYZAAR) 100-25 MG tablet Take 1 tablet by mouth daily. 30 tablet 3  . metFORMIN (GLUCOPHAGE) 500 MG tablet Take 1 tablet (500 mg total) by mouth 2 (two) times daily with a meal. 60 tablet 0  . warfarin (COUMADIN) 1 MG tablet TAKE 1/2 TABLET BY MOUTH ONCE DAILY 30 tablet 0  . warfarin (COUMADIN) 5 MG tablet TAKE 1 TABLET(5 MG) BY MOUTH DAILY 30 tablet 0   No current facility-administered medications on file prior to visit.     PAST MEDICAL HISTORY: Past Medical History:  Diagnosis Date  . Antiphospholipid antibody syndrome (HCC)   . Anxiety   . Arthritis   . Depression   . Endometriosis   . Endometriosis   . Fatigue   . Fibromyalgia   . GERD (gastroesophageal reflux disease)   . Heart murmur   . Hypertension   . IBS (irritable bowel syndrome)   . IBS (irritable bowel syndrome)   . Obesity   . Pulmonary embolism (HCC) 2004  . Shortness of breath   . Sleep apnea   . Vitamin D deficiency     PAST SURGICAL HISTORY: No past surgical history on file.  SOCIAL HISTORY: Social History   Tobacco Use  . Smoking status: Former Smoker  Last attempt to quit: 12/17/2008    Years since quitting: 8.6  . Smokeless tobacco: Never Used  Substance Use Topics  . Alcohol use: No  . Drug use: No    FAMILY HISTORY: Family History  Problem Relation Age of Onset  . Hyperlipidemia Mother   . Depression Mother   . Anxiety disorder Mother   . Obesity Mother   . Heart disease Father   . Alcohol abuse Father   . Hypertension Father   . Cancer Father   . Alcoholism Father   . Obesity Father     ROS: Review of Systems  Constitutional: Positive for weight loss.  Gastrointestinal: Negative for nausea and vomiting.  Musculoskeletal:       Negative muscle weakness    Psychiatric/Behavioral: Positive for depression. Negative for suicidal ideas.    PHYSICAL EXAM: Blood pressure 120/80, pulse 71, temperature 98.2 F (36.8 C), temperature source Oral, height  (1.727 m), weight 246 lb (111.6 kg), SpO2 100 %. Body mass index is 37.4 kg/m. Physical Exam  Constitutional: She is oriented to person, place, and time. She appears well-developed and well-nourished.  Cardiovascular: Normal rate.  Pulmonary/Chest: Effort normal.  Musculoskeletal: Normal range of motion.  Neurological: She is oriented to person, place, and time.  Skin: Skin is warm and dry.  Psychiatric: She has a normal mood and affect. Her behavior is normal.  Vitals reviewed.   RECENT LABS AND TESTS: BMET    Component Value Date/Time   NA 139 06/01/2017 0833   K 4.4 06/01/2017 0833   CL 101 06/01/2017 0833   CO2 25 06/01/2017 0833   GLUCOSE 84 06/01/2017 0833   GLUCOSE 97 05/21/2016 1026   BUN 16 06/01/2017 0833   CREATININE 0.77 06/01/2017 0833   CREATININE 0.80 05/21/2016 1026   CALCIUM 9.0 06/01/2017 0833   GFRNONAA 96 06/01/2017 0833   GFRAA 110 06/01/2017 0833   Lab Results  Component Value Date   HGBA1C 5.3 06/01/2017   HGBA1C 5.7 (H) 02/16/2017   HGBA1C 5.5 11/12/2016   Lab Results  Component Value Date   INSULIN 6.4 06/01/2017   INSULIN 9.0 02/16/2017   INSULIN 10.7 11/12/2016   CBC    Component Value Date/Time   WBC 5.9 11/12/2016 1128   WBC 5.6 05/21/2016 1026   RBC 4.61 11/12/2016 1128   RBC 4.72 05/21/2016 1026   HGB 13.7 11/12/2016 1128   HCT 41.2 11/12/2016 1128   PLT 297 11/12/2016 1128   MCV 89 11/12/2016 1128   MCH 29.7 11/12/2016 1128   MCH 28.8 05/21/2016 1026   MCHC 33.3 11/12/2016 1128   MCHC 32.4 05/21/2016 1026   RDW 13.1 11/12/2016 1128   LYMPHSABS 1.6 11/12/2016 1128   MONOABS 336 05/21/2016 1026   EOSABS 0.3 11/12/2016 1128   BASOSABS 0.0 11/12/2016 1128   Iron/TIBC/Ferritin/ %Sat No results found for: IRON, TIBC, FERRITIN,  IRONPCTSAT Lipid Panel     Component Value Date/Time   CHOL 189 06/01/2017 0833   TRIG 80 06/01/2017 0833   HDL 45 06/01/2017 0833   CHOLHDL 3.9 05/21/2016 1026   VLDL 13 05/21/2016 1026   LDLCALC 128 (H) 06/01/2017 0833   Hepatic Function Panel     Component Value Date/Time   PROT 6.8 06/01/2017 0833   ALBUMIN 4.1 06/01/2017 0833   AST 14 06/01/2017 0833   ALT 11 06/01/2017 0833   ALKPHOS 83 06/01/2017 0833   BILITOT 0.4 06/01/2017 0833      Component Value Date/Time  TSH 2.140 11/12/2016 1128   TSH 2.06 05/21/2016 1026   TSH 1.959 02/24/2011 1132  Results for Perrier, Daphna A (MRN [id number]) as of 08/03/2017 09:11  Ref. Range 06/01/2017 08:33  Vitamin D, 25-Hydroxy Latest Ref Range: 30.0 - 100.0 ng/mL 42.7    ASSESSMENT AND PLAN: Vitamin D deficiency - Plan: Vitamin D, Ergocalciferol, (DRISDOL) 50000 units CAPS capsule  Other depression - with emotional eating - Plan: buPROPion (WELLBUTRIN SR) 200 MG 12 hr tablet  At risk for osteoporosis  Class 2 severe obesity with serious comorbidity and body mass index (BMI) of 37.0 to 37.9 in adult, unspecified obesity type (HCC)  PLAN:  Vitamin D Deficiency Yaslyn was informed that low vitamin D levels contributes to fatigue and are associated with obesity, breast, and colon cancer. Lauren Burns agrees to continue taking prescription Vit D ,000 IU every week #4 and we will refill for 1 month. She will follow up for routine testing of vitamin D, at least 2-3 times per year. She was informed of the risk of over-replacement of vitamin D and agrees to not increase her dose unless she discusses this with [place] first. Lauren Burns agrees to follow up with our clinic in 2 weeks.  At risk for osteopenia and osteoporosis Lauren Burns is at risk for osteopenia and osteoporsis due to her vitamin D deficiency. She was encouraged to take her vitamin D and follow her higher calcium diet and increase strengthening exercise to help strengthen her bones and  decrease her risk of osteopenia and osteoporosis.  Depression with Emotional Eating Behaviors We discussed behavior modification techniques today to help Lauren Burns deal with her emotional eating and depression. Lauren Burns agrees to increase Wellbutrin SR to 200 mg qd #30 with no refills. Lauren Burns agrees to follow up with our clinic in 2 weeks.  Obesity Lauren Burns is currently in the action stage of change. As such, her goal is to continue with weight loss efforts She has agreed to keep a food journal with 1600 calories and 100 grams of protein daily Lauren Burns has been instructed to work up to a goal of 150 minutes of combined cardio and strengthening exercise per week for weight loss and overall health benefits. We discussed the following Behavioral Modification Strategies today: increasing lean protein intake and emotional eating strategies   Lauren Burns has agreed to follow up with our clinic in 2 weeks. She was informed of the importance of frequent follow up visits to maximize her success with intensive lifestyle modifications for her multiple health conditions.   OBESITY BEHAVIORAL INTERVENTION VISIT  Today's visit was # 16 out of 22.  Starting weight: 320 lbs Starting date: 11/12/16 Today's weight : 246 lbs Today's date: 08/02/2017 Total lbs lost to date: 87 (Patients must lose 7 lbs in the first 6 months to continue with counseling)   ASK: We discussed the diagnosis of obesity with Lauren Burns today and Lauren Burns agreed to give [place] permission to discuss obesity behavioral modification therapy today.  ASSESS: Lauren Burns has the diagnosis of obesity and her BMI today is 37.41 Lauren Burns is in the action stage of change   ADVISE: Lauren Burns was educated on the multiple health risks of obesity as well as the benefit of weight loss to improve her health. She was advised of the need for long term treatment and the importance of lifestyle modifications.  AGREE: Multiple dietary modification options  and treatment options were discussed and  Lauren Burns agreed to the above obesity treatment plan.   Trude Mcburney, am acting as  transcriptionist for Illa Level, PA-C I, Illa Level [facility], have reviewed this note and agree with its content

## 2017-08-12 ENCOUNTER — Other Ambulatory Visit: Payer: BLUE CROSS/BLUE SHIELD | Admitting: Internal Medicine

## 2017-08-12 ENCOUNTER — Ambulatory Visit: Payer: BLUE CROSS/BLUE SHIELD | Admitting: Internal Medicine

## 2017-08-17 ENCOUNTER — Telehealth: Payer: Self-pay | Admitting: Internal Medicine

## 2017-08-17 ENCOUNTER — Encounter: Payer: Self-pay | Admitting: Internal Medicine

## 2017-08-17 ENCOUNTER — Ambulatory Visit: Payer: BLUE CROSS/BLUE SHIELD | Admitting: Internal Medicine

## 2017-08-17 ENCOUNTER — Ambulatory Visit (INDEPENDENT_AMBULATORY_CARE_PROVIDER_SITE_OTHER): Payer: BLUE CROSS/BLUE SHIELD | Admitting: Physician Assistant

## 2017-08-17 ENCOUNTER — Other Ambulatory Visit: Payer: BLUE CROSS/BLUE SHIELD | Admitting: Internal Medicine

## 2017-08-17 VITALS — BP 131/82 | HR 69 | Temp 97.6°F | Ht 68.0 in | Wt 244.0 lb

## 2017-08-17 DIAGNOSIS — Z7901 Long term (current) use of anticoagulants: Secondary | ICD-10-CM | POA: Diagnosis not present

## 2017-08-17 DIAGNOSIS — R7303 Prediabetes: Secondary | ICD-10-CM

## 2017-08-17 DIAGNOSIS — Z6837 Body mass index (BMI) 37.0-37.9, adult: Secondary | ICD-10-CM

## 2017-08-17 DIAGNOSIS — Z9189 Other specified personal risk factors, not elsewhere classified: Secondary | ICD-10-CM | POA: Diagnosis not present

## 2017-08-17 DIAGNOSIS — E559 Vitamin D deficiency, unspecified: Secondary | ICD-10-CM | POA: Diagnosis not present

## 2017-08-17 LAB — PROTIME-INR
INR: 1.9 — ABNORMAL HIGH
Prothrombin Time: 20.3 s — ABNORMAL HIGH (ref 9.0–11.5)

## 2017-08-17 MED ORDER — METFORMIN HCL 500 MG PO TABS
500.0000 mg | ORAL_TABLET | Freq: Two times a day (BID) | ORAL | 0 refills | Status: DC
Start: 2017-08-17 — End: 2017-09-20

## 2017-08-17 MED ORDER — VITAMIN D (ERGOCALCIFEROL) 1.25 MG (50000 UNIT) PO CAPS: 50000.0000 [IU] | ORAL_CAPSULE | ORAL | 0 refills | Status: DC

## 2017-08-17 NOTE — Telephone Encounter (Signed)
Pro time INR is subtherapeutic at 1.9.  Increase Coumadin to 6 mg daily (5 mg +1 mg tablets) and follow-up in 2 weeks.

## 2017-08-17 NOTE — Telephone Encounter (Signed)
PT INR pending and she will be contacted with results.

## 2017-08-17 NOTE — Progress Notes (Signed)
Office: [phone]  /  Fax: [phone]   HPI:   Chief Complaint: OBESITY Lauren Burns is here to discuss her progress with her obesity treatment plan. She is on the keep Burns food journal with 1600 calories and 100 grams of protein daily and is following her eating plan approximately 90 to 95 % of the time. She states she is exercising 0 minutes 0 times per week. Lauren Burns continues to do well with weight loss. She is keeping Burns strict food journal and meets her protein intake. Her weight is 244 lb (110.7 kg) today and has had Burns weight loss of 2 pounds over Burns period of 2 weeks since her last visit. She has lost 76 lbs since starting treatment with [place].  Pre-Diabetes Lauren Burns has Burns diagnosis of prediabetes based on her elevated Hgb A1c and was informed this puts her at greater risk of developing diabetes. She is taking metformin currently and continues to work on diet and exercise to decrease risk of diabetes. She denies nausea, polyphagia or hypoglycemia.  At risk for diabetes Lauren Burns is at higher than average risk for developing diabetes due to her obesity and pre-diabetes. She currently denies polyuria or polydipsia.  Vitamin D deficiency Lauren Burns has Burns diagnosis of vitamin D deficiency. She is currently taking vit D and denies nausea, vomiting or muscle weakness.  ALLERGIES: No Known Allergies  MEDICATIONS: Current Outpatient Medications on File Prior to Visit  Medication Sig Dispense Refill  . buPROPion (WELLBUTRIN SR) 200 MG 12 hr tablet Take 1 tablet (200 mg total) by mouth daily at 12 noon. 30 tablet 0  . DULoxetine (CYMBALTA) 30 MG capsule Take 1 capsule (30 mg total) by mouth daily. 30 capsule 11  . losartan (COZAAR) 50 MG tablet Take 1 tablet (50 mg total) daily by mouth. 90 tablet 3  . warfarin (COUMADIN) 1 MG tablet TAKE 1/2 TABLET BY MOUTH ONCE DAILY 30 tablet 0  . warfarin (COUMADIN) 5 MG tablet TAKE 1 TABLET(5 MG) BY MOUTH DAILY 30 tablet 0   No current facility-administered  medications on file prior to visit.     PAST MEDICAL HISTORY: Past Medical History:  Diagnosis Date  . Antiphospholipid antibody syndrome (HCC)   . Anxiety   . Arthritis   . Depression   . Endometriosis   . Endometriosis   . Fatigue   . Fibromyalgia   . GERD (gastroesophageal reflux disease)   . Heart murmur   . Hypertension   . IBS (irritable bowel syndrome)   . IBS (irritable bowel syndrome)   . Obesity   . Pulmonary embolism (HCC) 2004  . Shortness of breath   . Sleep apnea   . Vitamin D deficiency     PAST SURGICAL HISTORY: No past surgical history on file.  SOCIAL HISTORY: Social History   Tobacco Use  . Smoking status: Former Smoker    Last attempt to quit: 12/17/2008    Years since quitting: 8.6  . Smokeless tobacco: Never Used  Substance Use Topics  . Alcohol use: No  . Drug use: No    FAMILY HISTORY: Family History  Problem Relation Age of Onset  . Hyperlipidemia Mother   . Depression Mother   . Anxiety disorder Mother   . Obesity Mother   . Heart disease Father   . Alcohol abuse Father   . Hypertension Father   . Cancer Father   . Alcoholism Father   . Obesity Father     ROS: Review of Systems  Constitutional:  Positive for weight loss.  Gastrointestinal: Negative for nausea and vomiting.  Genitourinary: Negative for frequency.  Musculoskeletal:       Negative for muscle weakness  Endo/Heme/Allergies: Negative for polydipsia.       Negative for polyphagia Negative for hypoglycemia    PHYSICAL EXAM: Blood pressure 131/82, pulse 69, temperature 97.6 F (36.4 C), height  (1.727 m), weight 244 lb (110.7 kg), SpO2 99 %. Body mass index is 37.1 kg/m. Physical Exam  Constitutional: She is oriented to person, place, and time. She appears well-developed and well-nourished.  Cardiovascular: Normal rate.  Pulmonary/Chest: Effort normal.  Musculoskeletal: Normal range of motion.  Neurological: She is oriented to person, place, and time.    Skin: Skin is warm and dry.  Psychiatric: She has Burns normal mood and affect. Her behavior is normal.  Vitals reviewed.   RECENT LABS AND TESTS: BMET    Component Value Date/Time   NA 139 06/01/2017 0833   K 4.4 06/01/2017 0833   CL 101 06/01/2017 0833   CO2 25 06/01/2017 0833   GLUCOSE 84 06/01/2017 0833   GLUCOSE 97 05/21/2016 1026   BUN 16 06/01/2017 0833   CREATININE 0.77 06/01/2017 0833   CREATININE 0.80 05/21/2016 1026   CALCIUM 9.0 06/01/2017 0833   GFRNONAA 96 06/01/2017 0833   GFRAA 110 06/01/2017 0833   Lab Results  Component Value Date   HGBA1C 5.3 06/01/2017   HGBA1C 5.7 (H) 02/16/2017   HGBA1C 5.5 11/12/2016   Lab Results  Component Value Date   INSULIN 6.4 06/01/2017   INSULIN 9.0 02/16/2017   INSULIN 10.7 11/12/2016   CBC    Component Value Date/Time   WBC 5.9 11/12/2016 1128   WBC 5.6 05/21/2016 1026   RBC 4.61 11/12/2016 1128   RBC 4.72 05/21/2016 1026   HGB 13.7 11/12/2016 1128   HCT 41.2 11/12/2016 1128   PLT 297 11/12/2016 1128   MCV 89 11/12/2016 1128   MCH 29.7 11/12/2016 1128   MCH 28.8 05/21/2016 1026   MCHC 33.3 11/12/2016 1128   MCHC 32.4 05/21/2016 1026   RDW 13.1 11/12/2016 1128   LYMPHSABS 1.6 11/12/2016 1128   MONOABS 336 05/21/2016 1026   EOSABS 0.3 11/12/2016 1128   BASOSABS 0.0 11/12/2016 1128   Iron/TIBC/Ferritin/ %Sat No results found for: IRON, TIBC, FERRITIN, IRONPCTSAT Lipid Panel     Component Value Date/Time   CHOL 189 06/01/2017 0833   TRIG 80 06/01/2017 0833   HDL 45 06/01/2017 0833   CHOLHDL 3.9 05/21/2016 1026   VLDL 13 05/21/2016 1026   LDLCALC 128 (H) 06/01/2017 0833   Hepatic Function Panel     Component Value Date/Time   PROT 6.8 06/01/2017 0833   ALBUMIN 4.1 06/01/2017 0833   AST 14 06/01/2017 0833   ALT 11 06/01/2017 0833   ALKPHOS 83 06/01/2017 0833   BILITOT 0.4 06/01/2017 0833      Component Value Date/Time   TSH 2.140 11/12/2016 1128   TSH 2.06 05/21/2016 1026   TSH 1.959 02/24/2011  1132   Results for Lauren Burns, Lauren Burns (MRN [id number]) as of 08/17/2017 11:12  Ref. Range 06/01/2017 08:33  Vitamin D, 25-Hydroxy Latest Ref Range: 30.0 - 100.0 ng/mL 42.7   ASSESSMENT AND PLAN: Prediabetes - Plan: metFORMIN (GLUCOPHAGE) 500 MG tablet  Vitamin D deficiency - Plan: Vitamin D, Ergocalciferol, (DRISDOL) 50000 units CAPS capsule  At risk for diabetes mellitus  Class 2 severe obesity with serious comorbidity and body mass index (BMI) of 37.0 to  37.9 in adult, unspecified obesity type [facility])  PLAN:  Pre-Diabetes Lauren Burns will continue to work on weight loss, exercise, and decreasing simple carbohydrates in her diet to help decrease the risk of diabetes. We dicussed metformin including benefits and risks. She was informed that eating too many simple carbohydrates or too many calories at one sitting increases the likelihood of GI side effects. Lauren Burns requested metformin for now and Burns prescription was written today for 1 month refill. Lauren Burns agreed to follow up with [place] as directed to monitor her progress.  Diabetes risk counseling Fantasha was given extended (15 minutes) diabetes prevention counseling today. She is 42 y.o. female and has risk factors for diabetes including obesity and pre-diabetes. We discussed intensive lifestyle modifications today with an emphasis on weight loss as well as increasing exercise and decreasing simple carbohydrates in her diet.  Vitamin D Deficiency Lauren Burns was informed that low vitamin D levels contributes to fatigue and are associated with obesity, breast, and colon cancer. She agrees to continue to take prescription Vit D ,000 IU every week #4 with no refills and will follow up for routine testing of vitamin D, at least 2-3 times per year. She was informed of the risk of over-replacement of vitamin D and agrees to not increase her dose unless she discusses this with [place] first. Lauren Burns agrees to follow up as directed.  Obesity Lauren Burns is currently in  the action stage of change. As such, her goal is to continue with weight loss efforts She has agreed to keep Burns food journal with 1600 calories and 100 grams of protein daily Lauren Burns has been instructed to work up to Burns goal of 150 minutes of combined cardio and strengthening exercise per week for weight loss and overall health benefits. We discussed the following Behavioral Modification Strategies today: increasing lean protein intake and keep Burns strict food journal  Lauren Burns has agreed to follow up with our clinic in 3 weeks. She was informed of the importance of frequent follow up visits to maximize her success with intensive lifestyle modifications for her multiple health conditions.   OBESITY BEHAVIORAL INTERVENTION VISIT  Today's visit was # 17 out of 22.  Starting weight: 320 lbs Starting date: 11/12/16 Today's weight : 244 lbs  Today's date: 08/17/2017 Total lbs lost to date: 17 (Patients must lose 7 lbs in the first 6 months to continue with counseling)   ASK: We discussed the diagnosis of obesity with Lauren Burns Burns Samons today and Lauren Burns agreed to give [place] permission to discuss obesity behavioral modification therapy today.  ASSESS: Lauren Burns has the diagnosis of obesity and her BMI today is 37.11 Lauren Burns is in the action stage of change   ADVISE: Lauren Burns was educated on the multiple health risks of obesity as well as the benefit of weight loss to improve her health. She was advised of the need for long term treatment and the importance of lifestyle modifications.  AGREE: Multiple dietary modification options and treatment options were discussed and  Lauren Burns agreed to the above obesity treatment plan.   Cristi Loron, am acting as transcriptionist for [employer], PA-C I, Illa Level [facility], have reviewed this note and agree with its content

## 2017-08-17 NOTE — Telephone Encounter (Signed)
Lauren Burns Self [phone]  Lauren Burns called to cancel appointment this afternoon, because she has another doctor's appointment and her insurance will not pay for two doctors appointments in the same day. I rescheduled for 5.28.19. She would like for someone to call with PT INR results so she knows how much medicine to take.

## 2017-08-17 NOTE — Addendum Note (Signed)
Addended by: Gregery Na on: 08/17/2017 08:57 AM   Modules accepted: Orders

## 2017-08-23 ENCOUNTER — Other Ambulatory Visit: Payer: Self-pay | Admitting: Internal Medicine

## 2017-08-23 MED ORDER — WARFARIN SODIUM 1 MG PO TABS: ORAL_TABLET | ORAL | 0 refills | Status: DC

## 2017-08-23 MED ORDER — WARFARIN SODIUM 5 MG PO TABS: 5.0000 mg | ORAL_TABLET | Freq: Once | ORAL | 0 refills | Status: DC

## 2017-08-24 ENCOUNTER — Ambulatory Visit: Payer: BLUE CROSS/BLUE SHIELD | Admitting: Internal Medicine

## 2017-08-24 ENCOUNTER — Other Ambulatory Visit: Payer: Self-pay | Admitting: Internal Medicine

## 2017-08-24 DIAGNOSIS — Z7901 Long term (current) use of anticoagulants: Secondary | ICD-10-CM

## 2017-08-31 ENCOUNTER — Ambulatory Visit: Payer: BLUE CROSS/BLUE SHIELD | Admitting: Internal Medicine

## 2017-08-31 ENCOUNTER — Other Ambulatory Visit: Payer: BLUE CROSS/BLUE SHIELD | Admitting: Internal Medicine

## 2017-08-31 ENCOUNTER — Encounter: Payer: Self-pay | Admitting: Internal Medicine

## 2017-08-31 VITALS — BP 120/80 | HR 76 | Ht 68.0 in | Wt 248.0 lb

## 2017-08-31 DIAGNOSIS — Z7901 Long term (current) use of anticoagulants: Secondary | ICD-10-CM

## 2017-08-31 DIAGNOSIS — Z8659 Personal history of other mental and behavioral disorders: Secondary | ICD-10-CM | POA: Diagnosis not present

## 2017-08-31 DIAGNOSIS — D6861 Antiphospholipid syndrome: Secondary | ICD-10-CM | POA: Diagnosis not present

## 2017-08-31 DIAGNOSIS — R7302 Impaired glucose tolerance (oral): Secondary | ICD-10-CM | POA: Diagnosis not present

## 2017-08-31 DIAGNOSIS — I10 Essential (primary) hypertension: Secondary | ICD-10-CM | POA: Diagnosis not present

## 2017-08-31 LAB — PROTIME-INR
INR: 1.9 — ABNORMAL HIGH
Prothrombin Time: 19.5 s — ABNORMAL HIGH (ref 9.0–11.5)

## 2017-08-31 NOTE — Progress Notes (Signed)
   Subjective:    Patient ID: Lauren Burns, female    DOB: 08/03/1975, 42 y.o.   MRN: [id number]  HPI History of antiphospholipid antibody syndrome on chronic anticoagulation with Coumadin.  Here today to follow-up on pro time INR.  Continues to see Dr. Dalbert GarnetBeasley regarding obesity.  History of hypertension treated with losartan.  History of impaired glucose tolerance.  She has been on high-dose vitamin D supplement per Dr. Dalbert GarnetBeasley  In September 2018 weight 310 pounds.  Now weighs 248 pounds.  Feels well.  Blood pressure excellent at 120/80.  History of depression treated with Cymbalta.  Dr. Dalbert GarnetBeasley now has her on Wellbutrin 200 mg every 12 hours.  Current BMI is 37.71 and weight is 248 pounds.  Pro time INR is subtherapeutic at 1.9.  It was also 1.9 on May 21   But was 2.1 in March.   She is now here for follow-up.    Review of Systems see above     Objective:   Physical Exam  Not examined but spent 20 minutes speaking with her about these issues and reviewing weight, blood pressure measurement and pro time INR.      Assessment & Plan:  Chronic anticoagulation due to antiphospholipid antibody syndrome.   At last visit pro time INR was subtherapeutic at 1.9 in May.  It is still subtherapeutic at 1.9.  Therefore we are increasing Coumadin to 6.5 mg daily  We will follow-up on June 18 with office visit and pro time INR.  Essential hypertension-stable on current regimen  History of depression-treated with antidepressants  Impaired glucose tolerance-treated with diet at the present time.  Continue to see Dr. Dalbert GarnetBeasley.  BMI is 37.71

## 2017-09-07 ENCOUNTER — Ambulatory Visit (INDEPENDENT_AMBULATORY_CARE_PROVIDER_SITE_OTHER): Payer: BLUE CROSS/BLUE SHIELD | Admitting: Physician Assistant

## 2017-09-07 VITALS — BP 104/72 | HR 70 | Temp 97.9°F | Ht 68.0 in | Wt 242.0 lb

## 2017-09-07 DIAGNOSIS — F3289 Other specified depressive episodes: Secondary | ICD-10-CM | POA: Diagnosis not present

## 2017-09-07 DIAGNOSIS — E7849 Other hyperlipidemia: Secondary | ICD-10-CM

## 2017-09-07 DIAGNOSIS — Z6836 Body mass index (BMI) 36.0-36.9, adult: Secondary | ICD-10-CM | POA: Diagnosis not present

## 2017-09-07 DIAGNOSIS — Z9189 Other specified personal risk factors, not elsewhere classified: Secondary | ICD-10-CM

## 2017-09-07 MED ORDER — BUPROPION HCL ER (SR) 200 MG PO TB12: 200.0000 mg | ORAL_TABLET | Freq: Every day | ORAL | 0 refills | Status: DC

## 2017-09-07 NOTE — Progress Notes (Signed)
99

## 2017-09-07 NOTE — Progress Notes (Signed)
Office: [phone]  /  Fax: [phone]   HPI:   Chief Complaint: OBESITY Lauren Burns is here to discuss her progress with her obesity treatment plan. She is on the keep a food journal with 1600 calories and 100 grams of protein daily and is following her eating plan approximately 90 % of the time. She states she is at the gym, cardio and strength for 60 minutes 3 times per week. Lauren Burns continues to do well with weight loss. She is mindful of her eating and keeps a strict food journal.  Her weight is 242 lb (109.8 kg) today and has had a weight loss of 2 pounds over a period of 3 weeks since her last visit. She has lost 78 lbs since starting treatment with [place].  Hyperlipidemia Lauren Burns has hyperlipidemia and has been trying to improve her cholesterol levels with intensive lifestyle modification including a low saturated fat diet, exercise and weight loss. She is not on medications, and declines. She denies any chest pain, claudication or myalgias.  At risk for cardiovascular disease Lauren Burns is at a higher than average risk for cardiovascular disease due to obesity and hyperlipidemia. She currently denies any chest pain.  Depression with emotional eating behaviors Lauren Burns is struggling with emotional eating and using food for comfort to the extent that it is negatively impacting her health. She often snacks when she is not hungry. Lauren Burns sometimes feels she is out of control and then feels guilty that she made poor food choices. She has been working on behavior modification techniques to help reduce her emotional eating and has been somewhat successful. Her mood is stable and she shows no sign of suicidal or homicidal ideations.  Depression screen PHQ 2/9 11/12/2016  Decreased Interest 1  Down, Depressed, Hopeless 1  PHQ - 2 Score 2  Altered sleeping 2  Tired, decreased energy 3  Change in appetite 1  Feeling bad or failure about yourself  1  Trouble concentrating 1  Moving slowly or  fidgety/restless 0  PHQ-9 Score 10    ALLERGIES: No Known Allergies  MEDICATIONS: Current Outpatient Medications on File Prior to Visit  Medication Sig Dispense Refill  . buPROPion (WELLBUTRIN SR) 200 MG 12 hr tablet Take 1 tablet (200 mg total) by mouth daily at 12 noon. 30 tablet 0  . DULoxetine (CYMBALTA) 30 MG capsule Take 1 capsule (30 mg total) by mouth daily. 30 capsule 11  . losartan (COZAAR) 50 MG tablet Take 1 tablet (50 mg total) daily by mouth. 90 tablet 3  . metFORMIN (GLUCOPHAGE) 500 MG tablet Take 1 tablet (500 mg total) by mouth 2 (two) times daily with a meal. 60 tablet 0  . Vitamin D, Ergocalciferol, (DRISDOL) 50000 units CAPS capsule Take 1 capsule (50,000 Units total) by mouth every 7 (seven) days. 4 capsule 0  . warfarin (COUMADIN) 1 MG tablet TAKE 1/2 TABLET BY MOUTH ONCE DAILY 30 tablet 0  . warfarin (COUMADIN) 1 MG tablet TAKE 1/2 TABLET BY MOUTH ONCE DAILY 30 tablet 0  . warfarin (COUMADIN) 5 MG tablet Take 1 tablet (5 mg total) by mouth one time only at 6 PM for 1 dose. 30 tablet 0   No current facility-administered medications on file prior to visit.     PAST MEDICAL HISTORY: Past Medical History:  Diagnosis Date  . Antiphospholipid antibody syndrome (HCC)   . Anxiety   . Arthritis   . Depression   . Endometriosis   . Endometriosis   . Fatigue   .  Fibromyalgia   . GERD (gastroesophageal reflux disease)   . Heart murmur   . Hypertension   . IBS (irritable bowel syndrome)   . IBS (irritable bowel syndrome)   . Obesity   . Pulmonary embolism (HCC) 2004  . Shortness of breath   . Sleep apnea   . Vitamin D deficiency     PAST SURGICAL HISTORY: No past surgical history on file.  SOCIAL HISTORY: Social History   Tobacco Use  . Smoking status: Former Smoker    Last attempt to quit: 12/17/2008    Years since quitting: 8.7  . Smokeless tobacco: Never Used  Substance Use Topics  . Alcohol use: No  . Drug use: No    FAMILY HISTORY: Family  History  Problem Relation Age of Onset  . Hyperlipidemia Mother   . Depression Mother   . Anxiety disorder Mother   . Obesity Mother   . Heart disease Father   . Alcohol abuse Father   . Hypertension Father   . Cancer Father   . Alcoholism Father   . Obesity Father     ROS: Review of Systems  Constitutional: Positive for weight loss.  Cardiovascular: Negative for chest pain and claudication.  Musculoskeletal: Negative for myalgias.  Psychiatric/Behavioral: Positive for depression. Negative for suicidal ideas.    PHYSICAL EXAM: Blood pressure 104/72, pulse 70, temperature 97.9 F (36.6 C), temperature source Oral, height 5\' 8"  (1.727 m), weight 242 lb (109.8 kg), SpO2 99 %. Body mass index is 36.8 kg/m. Physical Exam  Constitutional: She is oriented to person, place, and time. She appears well-developed and well-nourished.  Cardiovascular: Normal rate.  Pulmonary/Chest: Effort normal.  Musculoskeletal: Normal range of motion.  Neurological: She is oriented to person, place, and time.  Skin: Skin is warm and dry.  Psychiatric: She has a normal mood and affect. Her behavior is normal.  Vitals reviewed.   RECENT LABS AND TESTS: BMET    Component Value Date/Time   NA 139 06/01/2017 0833   K 4.4 06/01/2017 0833   CL 101 06/01/2017 0833   CO2 25 06/01/2017 0833   GLUCOSE 84 06/01/2017 0833   GLUCOSE 97 05/21/2016 1026   BUN 16 06/01/2017 0833   CREATININE 0.77 06/01/2017 0833   CREATININE 0.80 05/21/2016 1026   CALCIUM 9.0 06/01/2017 0833   GFRNONAA 96 06/01/2017 0833   GFRAA 110 06/01/2017 0833   Lab Results  Component Value Date   HGBA1C 5.3 06/01/2017   HGBA1C 5.7 (H) 02/16/2017   HGBA1C 5.5 11/12/2016   Lab Results  Component Value Date   INSULIN 6.4 06/01/2017   INSULIN 9.0 02/16/2017   INSULIN 10.7 11/12/2016   CBC    Component Value Date/Time   WBC 5.9 11/12/2016 1128   WBC 5.6 05/21/2016 1026   RBC 4.61 11/12/2016 1128   RBC 4.72 05/21/2016  1026   HGB 13.7 11/12/2016 1128   HCT 41.2 11/12/2016 1128   PLT 297 11/12/2016 1128   MCV 89 11/12/2016 1128   MCH 29.7 11/12/2016 1128   MCH 28.8 05/21/2016 1026   MCHC 33.3 11/12/2016 1128   MCHC 32.4 05/21/2016 1026   RDW 13.1 11/12/2016 1128   LYMPHSABS 1.6 11/12/2016 1128   MONOABS 336 05/21/2016 1026   EOSABS 0.3 11/12/2016 1128   BASOSABS 0.0 11/12/2016 1128   Iron/TIBC/Ferritin/ %Sat No results found for: IRON, TIBC, FERRITIN, IRONPCTSAT Lipid Panel     Component Value Date/Time   CHOL 189 06/01/2017 0833   TRIG 80 06/01/2017  4098   HDL 45 06/01/2017 0833   CHOLHDL 3.9 05/21/2016 1026   VLDL 13 05/21/2016 1026   LDLCALC 128 (H) 06/01/2017 0833   Hepatic Function Panel     Component Value Date/Time   PROT 6.8 06/01/2017 0833   ALBUMIN 4.1 06/01/2017 0833   AST 14 06/01/2017 0833   ALT 11 06/01/2017 0833   ALKPHOS 83 06/01/2017 0833   BILITOT 0.4 06/01/2017 0833      Component Value Date/Time   TSH 2.140 11/12/2016 1128   TSH 2.06 05/21/2016 1026   TSH 1.959 02/24/2011 1132    ASSESSMENT AND PLAN: Other hyperlipidemia  Other depression - with emotional eating - Plan: buPROPion (WELLBUTRIN SR) 200 MG 12 hr tablet  At risk for heart disease  Class 2 severe obesity with serious comorbidity and body mass index (BMI) of 36.0 to 36.9 in adult, unspecified obesity type (HCC)  PLAN:  Hyperlipidemia Lauren Burns was informed of the American Heart Association Guidelines emphasizing intensive lifestyle modifications as the first line treatment for hyperlipidemia. We discussed many lifestyle modifications today in depth, and Lauren Burns will continue to work on decreasing saturated fats such as fatty red meat, butter and many fried foods. She will also increase vegetables and lean protein in her diet and continue to work on diet, exercise, and weight loss efforts.  Cardiovascular risk counselling Lauren Burns was given extended (15 minutes) coronary artery disease prevention  counseling today. She is 42 y.o. female and has risk factors for heart disease including obesity and hyperlipidemia. We discussed intensive lifestyle modifications today with an emphasis on specific weight loss instructions and strategies. Pt was also informed of the importance of increasing exercise and decreasing saturated fats to help prevent heart disease.  Depression with Emotional Eating Behaviors We discussed behavior modification techniques today to help Lauren Burns deal with her emotional eating and depression. Lauren Burns agrees to continue taking Wellbutrin SR 200 mg qd #30 and we will refill for 1 month. Lauren Burns agrees to follow up with our clinic in 2 weeks.  Obesity Lauren Burns is currently in the action stage of change. As such, her goal is to continue with weight loss efforts She has agreed to keep a food journal with 1600 calories and 100 grams of protein daily Lauren Burns has been instructed to work up to a goal of 150 minutes of combined cardio and strengthening exercise per week for weight loss and overall health benefits. We discussed the following Behavioral Modification Strategies today: emotional eating strategies and keep a strict food journal   Lauren Burns has agreed to follow up with our clinic in 2 weeks. She was informed of the importance of frequent follow up visits to maximize her success with intensive lifestyle modifications for her multiple health conditions.   OBESITY BEHAVIORAL INTERVENTION VISIT  Today's visit was # 18 out of 22.  Starting weight: 320 lbs Starting date: 11/12/16 Today's weight : 242 lbs  Today's date: 09/07/2017 Total lbs lost to date: 27 (Patients must lose 7 lbs in the first 6 months to continue with counseling)   ASK: We discussed the diagnosis of obesity with Lauren Burns A Rampy today and Lauren Burns agreed to give [place] permission to discuss obesity behavioral modification therapy today.  ASSESS: Lauren Burns has the diagnosis of obesity and her BMI today is  36.8 Lauren Burns is in the action stage of change   ADVISE: Kalya was educated on the multiple health risks of obesity as well as the benefit of weight loss to improve her health. She was advised  of the need for long term treatment and the importance of lifestyle modifications.  AGREE: Multiple dietary modification options and treatment options were discussed and  Mallerie agreed to the above obesity treatment plan.   Trude Mcburney, am acting as transcriptionist for Illa Level, PA-C I, Illa Level [facility], have reviewed this note and agree with its content

## 2017-09-14 ENCOUNTER — Other Ambulatory Visit: Payer: BLUE CROSS/BLUE SHIELD | Admitting: Internal Medicine

## 2017-09-14 DIAGNOSIS — Z7901 Long term (current) use of anticoagulants: Secondary | ICD-10-CM | POA: Diagnosis not present

## 2017-09-14 LAB — PROTIME-INR
INR: 2.7 — AB
Prothrombin Time: 27.9 s — ABNORMAL HIGH (ref 9.0–11.5)

## 2017-09-18 ENCOUNTER — Other Ambulatory Visit: Payer: Self-pay | Admitting: Internal Medicine

## 2017-09-19 MED ORDER — WARFARIN SODIUM 1 MG PO TABS: 1.0000 mg | ORAL_TABLET | Freq: Every day | ORAL | 0 refills | Status: DC

## 2017-09-20 ENCOUNTER — Telehealth (INDEPENDENT_AMBULATORY_CARE_PROVIDER_SITE_OTHER): Payer: Self-pay | Admitting: Physician Assistant

## 2017-09-20 ENCOUNTER — Other Ambulatory Visit (INDEPENDENT_AMBULATORY_CARE_PROVIDER_SITE_OTHER): Payer: Self-pay | Admitting: Physician Assistant

## 2017-09-20 ENCOUNTER — Encounter: Payer: Self-pay | Admitting: Internal Medicine

## 2017-09-20 DIAGNOSIS — R7303 Prediabetes: Secondary | ICD-10-CM

## 2017-09-20 MED ORDER — METFORMIN HCL 500 MG PO TABS
500.0000 mg | ORAL_TABLET | Freq: Two times a day (BID) | ORAL | 0 refills | Status: DC
Start: 2017-09-20 — End: 2017-10-18

## 2017-09-20 NOTE — Telephone Encounter (Signed)
Medication sent into the pharmacy. Orvella Digiulio, CMA

## 2017-09-20 NOTE — Patient Instructions (Addendum)
Continue same antihypertensive medication.  Continue to work on diet and exercise.  Increase Coumadin to 6.5 mg daily and follow-up June 18.

## 2017-09-20 NOTE — Telephone Encounter (Signed)
Need refill on metformin

## 2017-09-21 ENCOUNTER — Ambulatory Visit (INDEPENDENT_AMBULATORY_CARE_PROVIDER_SITE_OTHER): Payer: BLUE CROSS/BLUE SHIELD | Admitting: Physician Assistant

## 2017-09-28 ENCOUNTER — Encounter (INDEPENDENT_AMBULATORY_CARE_PROVIDER_SITE_OTHER): Payer: Self-pay

## 2017-09-28 ENCOUNTER — Ambulatory Visit (INDEPENDENT_AMBULATORY_CARE_PROVIDER_SITE_OTHER): Payer: BLUE CROSS/BLUE SHIELD | Admitting: Physician Assistant

## 2017-10-04 ENCOUNTER — Encounter (INDEPENDENT_AMBULATORY_CARE_PROVIDER_SITE_OTHER): Payer: Self-pay | Admitting: Physician Assistant

## 2017-10-04 ENCOUNTER — Other Ambulatory Visit (INDEPENDENT_AMBULATORY_CARE_PROVIDER_SITE_OTHER): Payer: Self-pay | Admitting: Physician Assistant

## 2017-10-04 DIAGNOSIS — E559 Vitamin D deficiency, unspecified: Secondary | ICD-10-CM

## 2017-10-04 DIAGNOSIS — F3289 Other specified depressive episodes: Secondary | ICD-10-CM

## 2017-10-04 MED ORDER — BUPROPION HCL ER (SR) 200 MG PO TB12: 200.0000 mg | ORAL_TABLET | Freq: Every day | ORAL | 0 refills | Status: DC

## 2017-10-11 ENCOUNTER — Other Ambulatory Visit: Payer: Self-pay | Admitting: Internal Medicine

## 2017-10-11 DIAGNOSIS — Z7901 Long term (current) use of anticoagulants: Secondary | ICD-10-CM

## 2017-10-13 ENCOUNTER — Other Ambulatory Visit (INDEPENDENT_AMBULATORY_CARE_PROVIDER_SITE_OTHER): Payer: BLUE CROSS/BLUE SHIELD | Admitting: Internal Medicine

## 2017-10-13 VITALS — BP 110/80 | HR 72 | Temp 98.1°F

## 2017-10-13 DIAGNOSIS — R8290 Unspecified abnormal findings in urine: Secondary | ICD-10-CM | POA: Diagnosis not present

## 2017-10-13 DIAGNOSIS — R350 Frequency of micturition: Secondary | ICD-10-CM | POA: Diagnosis not present

## 2017-10-13 DIAGNOSIS — Z7901 Long term (current) use of anticoagulants: Secondary | ICD-10-CM

## 2017-10-13 DIAGNOSIS — R300 Dysuria: Secondary | ICD-10-CM | POA: Diagnosis not present

## 2017-10-13 DIAGNOSIS — N390 Urinary tract infection, site not specified: Secondary | ICD-10-CM

## 2017-10-13 MED ORDER — CIPROFLOXACIN HCL 500 MG PO TABS: 500.0000 mg | ORAL_TABLET | Freq: Two times a day (BID) | ORAL | 0 refills | Status: DC

## 2017-10-13 NOTE — Progress Notes (Signed)
Came in for protime INR and complained of UTI symptoms. Seen by nurse. Dipstick U/A abnormal. Culture sent. Rx Cipro 500 mg bid x 7 days.

## 2017-10-13 NOTE — Patient Instructions (Signed)
Cipro 500 mg twice a day x 7 days. Culture pending. PT INR drawn

## 2017-10-14 ENCOUNTER — Telehealth: Payer: Self-pay | Admitting: Internal Medicine

## 2017-10-14 ENCOUNTER — Other Ambulatory Visit: Payer: BLUE CROSS/BLUE SHIELD | Admitting: Internal Medicine

## 2017-10-14 ENCOUNTER — Ambulatory Visit: Payer: BLUE CROSS/BLUE SHIELD | Admitting: Internal Medicine

## 2017-10-14 LAB — URINE CULTURE
MICRO NUMBER:: 90846456
SPECIMEN QUALITY: ADEQUATE

## 2017-10-14 LAB — PROTIME-INR
INR: 1.8 — AB
PROTHROMBIN TIME: 18.8 s — AB (ref 9.0–11.5)

## 2017-10-14 NOTE — Telephone Encounter (Signed)
Cancelled appointment for this afternoon due to lab results.  INR is 1.8.  Spoke with patient and she states she hasn't missed any doses of medication.  She has been taking 6.5mg .    Dr. Lenord FellersBaxley would like for patient to increase her dosage to 7mg  daily and be seen for STAT PT/INR in 2 weeks and recheck OV.    Spoke with patient and she states they will be on vacation and be back 8/5.  Book STAT lab and OV for 8/8.  She will call us this evening once she gets home and let us know if she needs more 6mg  called in or any 1mg  called in to the pharmacy so she has enough medication to get her through on vacation and through to her next appointment on 8/8.

## 2017-10-18 ENCOUNTER — Ambulatory Visit (INDEPENDENT_AMBULATORY_CARE_PROVIDER_SITE_OTHER): Payer: BLUE CROSS/BLUE SHIELD | Admitting: Physician Assistant

## 2017-10-18 VITALS — BP 106/72 | HR 84 | Temp 98.6°F | Ht 68.0 in | Wt 235.0 lb

## 2017-10-18 DIAGNOSIS — Z9189 Other specified personal risk factors, not elsewhere classified: Secondary | ICD-10-CM | POA: Diagnosis not present

## 2017-10-18 DIAGNOSIS — E559 Vitamin D deficiency, unspecified: Secondary | ICD-10-CM | POA: Diagnosis not present

## 2017-10-18 DIAGNOSIS — Z6835 Body mass index (BMI) 35.0-35.9, adult: Secondary | ICD-10-CM | POA: Diagnosis not present

## 2017-10-18 DIAGNOSIS — R7303 Prediabetes: Secondary | ICD-10-CM | POA: Diagnosis not present

## 2017-10-18 MED ORDER — VITAMIN D (ERGOCALCIFEROL) 1.25 MG (50000 UNIT) PO CAPS: 50000.0000 [IU] | ORAL_CAPSULE | ORAL | 0 refills | Status: DC

## 2017-10-18 MED ORDER — METFORMIN HCL 500 MG PO TABS: 500.0000 mg | ORAL_TABLET | Freq: Two times a day (BID) | ORAL | 0 refills | Status: DC

## 2017-10-18 NOTE — Progress Notes (Signed)
Office: [phone]  /  Fax: [phone]   HPI:   Chief Complaint: OBESITY Lauren Burns is here to discuss her progress with her obesity treatment plan. She is on the keep Lauren food journal with 1600 calories and 100 grams of protein daily and is following her eating plan approximately 90 % of the time. She states she is exercising 0 minutes 0 times per week. Lauren Burns continues to do well with weight loss. She keeps Lauren strict food journal and meets her protein goals.  Her weight is 235 lb (106.6 kg) today and has had Lauren weight loss of 7 pounds over Lauren period of 6 weeks since her last visit. She has lost 85 lbs since starting treatment with us.  Vitamin D Deficiency Lauren Burns has Lauren diagnosis of vitamin D deficiency. She is currently taking prescription Vit D and denies nausea, vomiting or muscle weakness.  Pre-Diabetes Lauren Burns has Lauren diagnosis of pre-diabetes based on her elevated Hgb A1c and was informed this puts her at greater risk of developing diabetes. She is taking metformin currently and continues to work on diet and exercise to decrease risk of diabetes. She denies polyphagia, nausea, or hypoglycemia.  At risk for diabetes Lauren Burns is at higher than average risk for developing diabetes due to her obesity and pre-diabetes. She currently denies polyuria or polydipsia.  Hypertension Lauren Burns is Lauren 42 y.o. female with hypertension. Lauren Burns's blood pressure is low normal and she denies chest pain or shortness of breath. She is working weight loss to help control her blood pressure with the goal of decreasing her risk of heart attack and stroke.  ALLERGIES: No Known Allergies  MEDICATIONS: Current Outpatient Medications on File Prior to Visit  Medication Sig Dispense Refill  . buPROPion (WELLBUTRIN SR) 200 MG 12 hr tablet Take 1 tablet (200 mg total) by mouth daily at 12 noon. 30 tablet 0  . ciprofloxacin (CIPRO) 500 MG tablet Take 1 tablet (500 mg total) by mouth 2 (two) times daily. 14  tablet 0  . DULoxetine (CYMBALTA) 30 MG capsule Take 1 capsule (30 mg total) by mouth daily. 30 capsule 11  . losartan (COZAAR) 50 MG tablet Take 1 tablet (50 mg total) daily by mouth. (Patient taking differently: Take 25 mg by mouth daily. ) 90 tablet 3  . warfarin (COUMADIN) 1 MG tablet Take 1 tablet (1 mg total) by mouth daily. 30 tablet 0  . warfarin (COUMADIN) 5 MG tablet TAKE 1 TABLET(5 MG) BY MOUTH 1 TIME ONLY AT 6 PM FOR 1 DOSE 30 tablet 0   No current facility-administered medications on file prior to visit.     PAST MEDICAL HISTORY: Past Medical History:  Diagnosis Date  . Antiphospholipid antibody syndrome (HCC)   . Anxiety   . Arthritis   . Depression   . Endometriosis   . Endometriosis   . Fatigue   . Fibromyalgia   . GERD (gastroesophageal reflux disease)   . Heart murmur   . Hypertension   . IBS (irritable bowel syndrome)   . IBS (irritable bowel syndrome)   . Obesity   . Pulmonary embolism (HCC) 2004  . Shortness of breath   . Sleep apnea   . Vitamin D deficiency     PAST SURGICAL HISTORY: No past surgical history on file.  SOCIAL HISTORY: Social History   Tobacco Use  . Smoking status: Former Smoker    Last attempt to quit: 12/17/2008    Years since quitting: 8.8  . Smokeless tobacco:  Never Used  Substance Use Topics  . Alcohol use: No  . Drug use: No    FAMILY HISTORY: Family History  Problem Relation Age of Onset  . Hyperlipidemia Mother   . Depression Mother   . Anxiety disorder Mother   . Obesity Mother   . Heart disease Father   . Alcohol abuse Father   . Hypertension Father   . Cancer Father   . Alcoholism Father   . Obesity Father     ROS: Review of Systems  Constitutional: Positive for weight loss.  Respiratory: Negative for shortness of breath.   Cardiovascular: Negative for chest pain.  Gastrointestinal: Negative for nausea and vomiting.  Genitourinary: Negative for frequency.  Musculoskeletal:       Negative muscle  weakness  Endo/Heme/Allergies: Negative for polydipsia.       Negative polyphagia  Negative hypoglycemia    PHYSICAL EXAM: Blood pressure 106/72, pulse 84, temperature 98.6 F (37 C), temperature source Oral, height 5\' 8"  (1.727 m), weight 235 lb (106.6 kg), SpO2 99 %. Body mass index is 35.73 kg/m. Physical Exam  Constitutional: She is oriented to person, place, and time. She appears well-developed and well-nourished.  Cardiovascular: Normal rate.  Pulmonary/Chest: Effort normal.  Musculoskeletal: Normal range of motion.  Neurological: She is oriented to person, place, and time.  Skin: Skin is warm and dry.  Psychiatric: She has Lauren normal mood and affect. Her behavior is normal.  Vitals reviewed.   RECENT LABS AND TESTS: BMET    Component Value Date/Time   NA 139 06/01/2017 0833   K 4.4 06/01/2017 0833   CL 101 06/01/2017 0833   CO2 25 06/01/2017 0833   GLUCOSE 84 06/01/2017 0833   GLUCOSE 97 05/21/2016 1026   BUN 16 06/01/2017 0833   CREATININE 0.77 06/01/2017 0833   CREATININE 0.80 05/21/2016 1026   CALCIUM 9.0 06/01/2017 0833   GFRNONAA 96 06/01/2017 0833   GFRAA 110 06/01/2017 0833   Lab Results  Component Value Date   HGBA1C 5.3 06/01/2017   HGBA1C 5.7 (H) 02/16/2017   HGBA1C 5.5 11/12/2016   Lab Results  Component Value Date   INSULIN 6.4 06/01/2017   INSULIN 9.0 02/16/2017   INSULIN 10.7 11/12/2016   CBC    Component Value Date/Time   WBC 5.9 11/12/2016 1128   WBC 5.6 05/21/2016 1026   RBC 4.61 11/12/2016 1128   RBC 4.72 05/21/2016 1026   HGB 13.7 11/12/2016 1128   HCT 41.2 11/12/2016 1128   PLT 297 11/12/2016 1128   MCV 89 11/12/2016 1128   MCH 29.7 11/12/2016 1128   MCH 28.8 05/21/2016 1026   MCHC 33.3 11/12/2016 1128   MCHC 32.4 05/21/2016 1026   RDW 13.1 11/12/2016 1128   LYMPHSABS 1.6 11/12/2016 1128   MONOABS 336 05/21/2016 1026   EOSABS 0.3 11/12/2016 1128   BASOSABS 0.0 11/12/2016 1128   Iron/TIBC/Ferritin/ %Sat No results found  for: IRON, TIBC, FERRITIN, IRONPCTSAT Lipid Panel     Component Value Date/Time   CHOL 189 06/01/2017 0833   TRIG 80 06/01/2017 0833   HDL 45 06/01/2017 0833   CHOLHDL 3.9 05/21/2016 1026   VLDL 13 05/21/2016 1026   LDLCALC 128 (H) 06/01/2017 0833   Hepatic Function Panel     Component Value Date/Time   PROT 6.8 06/01/2017 0833   ALBUMIN 4.1 06/01/2017 0833   AST 14 06/01/2017 0833   ALT 11 06/01/2017 0833   ALKPHOS 83 06/01/2017 0833   BILITOT 0.4 06/01/2017 1610  Component Value Date/Time   TSH 2.140 11/12/2016 1128   TSH 2.06 05/21/2016 1026   TSH 1.959 02/24/2011 1132    ASSESSMENT AND PLAN: Vitamin D deficiency - Plan: Vitamin D, Ergocalciferol, (DRISDOL) 50000 units CAPS capsule  Prediabetes - Plan: metFORMIN (GLUCOPHAGE) 500 MG tablet  At risk for diabetes mellitus  Class 2 severe obesity with serious comorbidity and body mass index (BMI) of 35.0 to 35.9 in adult, unspecified obesity type (HCC)  PLAN:  Vitamin D Deficiency Markiah was informed that low vitamin D levels contributes to fatigue and are associated with obesity, breast, and colon cancer. Lauren Burns agrees to continue taking prescription Vit D @50 ,000 IU every week #4 and we will refill for 1 month. She will follow up for routine testing of vitamin D, at least 2-3 times per year. She was informed of the risk of over-replacement of vitamin D and agrees to not increase her dose unless she discusses this with [place] first. Tayen agrees to follow up with our clinic in 3 weeks.  Pre-Diabetes Lauren Burns will continue to work on weight loss, exercise, and decreasing simple carbohydrates in her diet to help decrease the risk of diabetes. We dicussed metformin including benefits and risks. She was informed that eating too many simple carbohydrates or too many calories at one sitting increases the likelihood of GI side effects. Lauren Burns agrees to continue taking metformin 500 mg BID #60 and we will refill for 1 month.  Lauren Burns agrees to follow up with our clinic in 3 weeks as directed to monitor her progress.  Diabetes risk counselling Lauren Burns was given extended (15 minutes) diabetes prevention counseling today. She is 42 y.o. female and has risk factors for diabetes including obesity and pre-diabetes. We discussed intensive lifestyle modifications today with an emphasis on weight loss as well as increasing exercise and decreasing simple carbohydrates in her diet.  Hypertension We discussed sodium restriction, working on healthy weight loss, and Lauren regular exercise program as the means to achieve improved blood pressure control. Lauren Burns agreed with this plan and agreed to follow up as directed. We will continue to monitor her blood pressure as well as her progress with the above lifestyle modifications. Lauren Burns agrees to reduce losartan to half Lauren pil 25 mg, no refill needed. She will watch for signs of hypotension as she continues her lifestyle modifications. Lauren Burns agrees to follow up with our clinic in 3 weeks.  Obesity Lauren Burns is currently in the action stage of change. As such, her goal is to continue with weight loss efforts She has agreed to keep Lauren food journal with 1600 calories and 90 grams of protein daily Lauren Burns has been instructed to work up to Lauren goal of 150 minutes of combined cardio and strengthening exercise per week for weight loss and overall health benefits. We discussed the following Behavioral Modification Strategies today: work on meal planning and easy cooking plans and planning for success   Lauren Burns has agreed to follow up with our clinic in 3 weeks. She was informed of the importance of frequent follow up visits to maximize her success with intensive lifestyle modifications for her multiple health conditions.   OBESITY BEHAVIORAL INTERVENTION VISIT  Today's visit was # 19 out of 22.  Starting weight: 320 lbs Starting date: 11/12/16 Today's weight : 235 lbs Today's date:  10/18/2017 Total lbs lost to date: 59 (Patients must lose 7 lbs in the first 6 months to continue with counseling)   ASK: We discussed the diagnosis of obesity with [facility]  Lauren Keltz today and Ronette agreed to give [place] permission to discuss obesity behavioral modification therapy today.  ASSESS: Lauren Burns has the diagnosis of obesity and her BMI today is 35.74 Lauren Burns is in the action stage of change   ADVISE: Lauren Burns was educated on the multiple health risks of obesity as well as the benefit of weight loss to improve her health. She was advised of the need for long term treatment and the importance of lifestyle modifications.  AGREE: Multiple dietary modification options and treatment options were discussed and  Janeisha agreed to the above obesity treatment plan.   Trude Mcburney, am acting as transcriptionist for Illa Level, PA-C I, Illa Level [facility], have reviewed this note and agree with its content

## 2017-10-20 ENCOUNTER — Other Ambulatory Visit: Payer: Self-pay

## 2017-10-20 MED ORDER — WARFARIN SODIUM 1 MG PO TABS: ORAL_TABLET | ORAL | 0 refills | Status: DC

## 2017-10-20 MED ORDER — DULOXETINE HCL 30 MG PO CPEP: 30.0000 mg | ORAL_CAPSULE | Freq: Every day | ORAL | 5 refills | Status: DC

## 2017-10-20 MED ORDER — WARFARIN SODIUM 6 MG PO TABS: 6.0000 mg | ORAL_TABLET | Freq: Every day | ORAL | 0 refills | Status: DC

## 2017-10-22 ENCOUNTER — Telehealth: Payer: Self-pay | Admitting: Internal Medicine

## 2017-10-22 NOTE — Telephone Encounter (Signed)
Patient states that she doesn't think that the antibiotic took care of the UTI.  She's still hurting and having symptoms.  States that she has had enough UTI's that she knows she still has one.  She is getting ready to leave for vacation and still is not rid of it.  Wants to know if you can prescribe her something else to get rid of this?    [facility] [address]  Phone #:  [phone]  Thank you.    Urine Culture Result:   Result: Multiple organisms present, each less than 10,000 CFU/mL. These organisms, commonly found on external and internal genitalia, are considered to be colonizers. No further testing performed.

## 2017-10-22 NOTE — Telephone Encounter (Signed)
Called patient and advised of your message.  Offered for her to come in today.  She said that she was just going to have to suffer through it because she didn't have anyone to watch the store today.  I apologized to her.  She disconnected the call.

## 2017-10-22 NOTE — Telephone Encounter (Signed)
She did not have a UTI. Culture grew multiple species. Needs to come and be seen.

## 2017-11-04 ENCOUNTER — Other Ambulatory Visit: Payer: BLUE CROSS/BLUE SHIELD | Admitting: Internal Medicine

## 2017-11-04 ENCOUNTER — Ambulatory Visit: Payer: BLUE CROSS/BLUE SHIELD | Admitting: Internal Medicine

## 2017-11-04 ENCOUNTER — Encounter: Payer: Self-pay | Admitting: Internal Medicine

## 2017-11-04 VITALS — BP 110/60 | HR 72 | Ht 68.0 in | Wt 245.0 lb

## 2017-11-04 DIAGNOSIS — D6861 Antiphospholipid syndrome: Secondary | ICD-10-CM

## 2017-11-04 DIAGNOSIS — R350 Frequency of micturition: Secondary | ICD-10-CM

## 2017-11-04 DIAGNOSIS — Z7901 Long term (current) use of anticoagulants: Secondary | ICD-10-CM

## 2017-11-04 DIAGNOSIS — R8290 Unspecified abnormal findings in urine: Secondary | ICD-10-CM | POA: Diagnosis not present

## 2017-11-04 DIAGNOSIS — F3289 Other specified depressive episodes: Secondary | ICD-10-CM

## 2017-11-04 LAB — POCT URINALYSIS DIPSTICK
BILIRUBIN UA: NEGATIVE
Blood, UA: NEGATIVE
GLUCOSE UA: NEGATIVE
Ketones, UA: NEGATIVE
Nitrite, UA: NEGATIVE
PH UA: 6.5 (ref 5.0–8.0)
Protein, UA: NEGATIVE
Spec Grav, UA: 1.015 (ref 1.010–1.025)
Urobilinogen, UA: 0.2 E.U./dL

## 2017-11-04 LAB — PROTIME-INR
INR: 2.7 — ABNORMAL HIGH
Prothrombin Time: 28.3 s — ABNORMAL HIGH (ref 9.0–11.5)

## 2017-11-04 MED ORDER — BUPROPION HCL ER (SR) 200 MG PO TB12: 200.0000 mg | ORAL_TABLET | Freq: Every day | ORAL | 0 refills | Status: DC

## 2017-11-04 MED ORDER — WARFARIN SODIUM 6 MG PO TABS: 6.0000 mg | ORAL_TABLET | Freq: Every day | ORAL | 0 refills | Status: DC

## 2017-11-04 MED ORDER — WARFARIN SODIUM 1 MG PO TABS: 1.0000 mg | ORAL_TABLET | Freq: Every day | ORAL | 0 refills | Status: DC

## 2017-11-04 NOTE — Progress Notes (Signed)
   Subjective:    Patient ID: Lauren Burns, female    DOB: 01/14/1976, 42 y.o.   MRN: [id number]  HPI Follow-up of pro time INR.  History of antiphospholipid antibody syndrome requiring chronic anticoagulation with continues to see Dr. Dalbert GarnetBeasley and is doing very well with weight loss.  Pro time INR stable at 2.7.  Previously was subtherapeutic at 1.8 but we increased dose of Coumadin.  She is been having issues with urinary frequency and discomfort in her suprapubic area.  In July her urine culture showed multiple species consistent with contamination.  Urine specimen today shows 1+ LAD.  Culture sent.  Addendum: Urine culture shows multiple organisms present consistent with contamination.  Patient says she will see GYN physician for further evaluation.  Remains on Wellbutrin and also Cymbalta.  Has history of depression.   Currently on Coumadin 7 mg daily.  Continue same dose.  We generally see her q. 6 weeks.  25 minutes with patient discussing these issues.  Review of Systems     Objective:   Physical Exam  Not examined.  Discussion regarding medical issues including pelvic discomfort/urinary frequency, weight loss regimen, dosage of Coumadin      Assessment & Plan:  Chronic anticoagulation with history of antiphospholipid antibody syndrome  Obesity-continue to see Dr. Dalbert GarnetBeasley  Pelvic discomfort/urinary frequency.  No evidence of UTI.  See GYN physician.  Says she has history of endometriosis.  History of depression-continue Cymbalta and Wellbutrin.  Plan: Return in 6 weeks

## 2017-11-05 LAB — URINE CULTURE
MICRO NUMBER: 90940186
SPECIMEN QUALITY: ADEQUATE

## 2017-11-09 ENCOUNTER — Ambulatory Visit (INDEPENDENT_AMBULATORY_CARE_PROVIDER_SITE_OTHER): Payer: BLUE CROSS/BLUE SHIELD | Admitting: Physician Assistant

## 2017-11-09 VITALS — BP 124/79 | HR 78 | Temp 98.5°F | Ht 68.0 in | Wt 237.0 lb

## 2017-11-09 DIAGNOSIS — R7303 Prediabetes: Secondary | ICD-10-CM | POA: Diagnosis not present

## 2017-11-09 DIAGNOSIS — Z9189 Other specified personal risk factors, not elsewhere classified: Secondary | ICD-10-CM

## 2017-11-09 DIAGNOSIS — E559 Vitamin D deficiency, unspecified: Secondary | ICD-10-CM

## 2017-11-09 DIAGNOSIS — Z6836 Body mass index (BMI) 36.0-36.9, adult: Secondary | ICD-10-CM

## 2017-11-09 MED ORDER — METFORMIN HCL 500 MG PO TABS: 500.0000 mg | ORAL_TABLET | Freq: Two times a day (BID) | ORAL | 0 refills | Status: DC

## 2017-11-09 MED ORDER — VITAMIN D (ERGOCALCIFEROL) 1.25 MG (50000 UNIT) PO CAPS: 50000.0000 [IU] | ORAL_CAPSULE | ORAL | 0 refills | Status: DC

## 2017-11-09 NOTE — Progress Notes (Signed)
Office: [phone]  /  Fax: [phone]   HPI:   Chief Complaint: OBESITY Lauren Burns is here to discuss her progress with her obesity treatment plan. She is on the keep a food journal with 1600 calories and 90 grams of protein daily and is following her eating plan approximately 75 % of the time. She states she is exercising 0 minutes 0 times per week. Lauren Burns was on vacation the last couple of weeks and reports getting off track with eating and journaling. She is ready to get back on track. Her weight is 237 lb (107.5 kg) today and has had a weight gain of 2 pounds over a period of 3 weeks since her last visit. She has lost 83 lbs since starting treatment with [place].  Pre-Diabetes Lauren Burns has a diagnosis of prediabetes based on her elevated Hgb A1c and was informed this puts her at greater risk of developing diabetes. She is taking metformin currently and continues to work on diet and exercise to decrease risk of diabetes. She denies nausea, vomiting or diarrhea. She denies polyphagia or hypoglycemia.  At risk for diabetes Lauren Burns is at higher than average risk for developing diabetes due to her obesity and prediabetes. She currently denies polyuria or polydipsia.  Vitamin D deficiency Lauren Burns has a diagnosis of vitamin D deficiency. She is currently taking vit D and denies nausea, vomiting or muscle weakness.  ALLERGIES: No Known Allergies  MEDICATIONS: Current Outpatient Medications on File Prior to Visit  Medication Sig Dispense Refill  . buPROPion (WELLBUTRIN SR) 200 MG 12 hr tablet Take 1 tablet (200 mg total) by mouth daily at 12 noon. 90 tablet 0  . DULoxetine (CYMBALTA) 30 MG capsule Take 1 capsule (30 mg total) by mouth daily. 30 capsule 5  . losartan (COZAAR) 50 MG tablet Take 1 tablet (50 mg total) daily by mouth. (Patient taking differently: Take 25 mg by mouth daily. ) 90 tablet 3  . warfarin (COUMADIN) 1 MG tablet Take 1 tablet (1 mg total) by mouth daily. 30 tablet 0  .  warfarin (COUMADIN) 6 MG tablet Take 1 tablet (6 mg total) by mouth daily. 30 tablet 0   No current facility-administered medications on file prior to visit.     PAST MEDICAL HISTORY: Past Medical History:  Diagnosis Date  . Antiphospholipid antibody syndrome (HCC)   . Anxiety   . Arthritis   . Depression   . Endometriosis   . Endometriosis   . Fatigue   . Fibromyalgia   . GERD (gastroesophageal reflux disease)   . Heart murmur   . Hypertension   . IBS (irritable bowel syndrome)   . IBS (irritable bowel syndrome)   . Obesity   . Pulmonary embolism (HCC) 2004  . Shortness of breath   . Sleep apnea   . Vitamin D deficiency     PAST SURGICAL HISTORY: No past surgical history on file.  SOCIAL HISTORY: Social History   Tobacco Use  . Smoking status: Former Smoker    Last attempt to quit: 12/17/2008    Years since quitting: 8.9  . Smokeless tobacco: Never Used  Substance Use Topics  . Alcohol use: No  . Drug use: No    FAMILY HISTORY: Family History  Problem Relation Age of Onset  . Hyperlipidemia Mother   . Depression Mother   . Anxiety disorder Mother   . Obesity Mother   . Heart disease Father   . Alcohol abuse Father   . Hypertension Father   .  Cancer Father   . Alcoholism Father   . Obesity Father     ROS: Review of Systems  Constitutional: Negative for weight loss.  Gastrointestinal: Negative for diarrhea, nausea and vomiting.  Genitourinary: Negative for frequency.  Musculoskeletal:       Negative for muscle weakness  Endo/Heme/Allergies: Negative for polydipsia.       Negative for hypoglycemia Negative for polyphagia    PHYSICAL EXAM: Blood pressure 124/79, pulse 78, temperature 98.5 F (36.9 C), temperature source Oral, height 5\' 8"  (1.727 m), weight 237 lb (107.5 kg), SpO2 99 %. Body mass index is 36.04 kg/m. Physical Exam  Constitutional: She is oriented to person, place, and time. She appears well-developed and well-nourished.    Cardiovascular: Normal rate.  Pulmonary/Chest: Effort normal.  Musculoskeletal: Normal range of motion.  Neurological: She is oriented to person, place, and time.  Skin: Skin is warm and dry.  Psychiatric: She has a normal mood and affect.  Vitals reviewed.   RECENT LABS AND TESTS: BMET    Component Value Date/Time   NA 139 06/01/2017 0833   K 4.4 06/01/2017 0833   CL 101 06/01/2017 0833   CO2 25 06/01/2017 0833   GLUCOSE 84 06/01/2017 0833   GLUCOSE 97 05/21/2016 1026   BUN 16 06/01/2017 0833   CREATININE 0.77 06/01/2017 0833   CREATININE 0.80 05/21/2016 1026   CALCIUM 9.0 06/01/2017 0833   GFRNONAA 96 06/01/2017 0833   GFRAA 110 06/01/2017 0833   Lab Results  Component Value Date   HGBA1C 5.3 06/01/2017   HGBA1C 5.7 (H) 02/16/2017   HGBA1C 5.5 11/12/2016   Lab Results  Component Value Date   INSULIN 6.4 06/01/2017   INSULIN 9.0 02/16/2017   INSULIN 10.7 11/12/2016   CBC    Component Value Date/Time   WBC 5.9 11/12/2016 1128   WBC 5.6 05/21/2016 1026   RBC 4.61 11/12/2016 1128   RBC 4.72 05/21/2016 1026   HGB 13.7 11/12/2016 1128   HCT 41.2 11/12/2016 1128   PLT 297 11/12/2016 1128   MCV 89 11/12/2016 1128   MCH 29.7 11/12/2016 1128   MCH 28.8 05/21/2016 1026   MCHC 33.3 11/12/2016 1128   MCHC 32.4 05/21/2016 1026   RDW 13.1 11/12/2016 1128   LYMPHSABS 1.6 11/12/2016 1128   MONOABS 336 05/21/2016 1026   EOSABS 0.3 11/12/2016 1128   BASOSABS 0.0 11/12/2016 1128   Iron/TIBC/Ferritin/ %Sat No results found for: IRON, TIBC, FERRITIN, IRONPCTSAT Lipid Panel     Component Value Date/Time   CHOL 189 06/01/2017 0833   TRIG 80 06/01/2017 0833   HDL 45 06/01/2017 0833   CHOLHDL 3.9 05/21/2016 1026   VLDL 13 05/21/2016 1026   LDLCALC 128 (H) 06/01/2017 0833   Hepatic Function Panel     Component Value Date/Time   PROT 6.8 06/01/2017 0833   ALBUMIN 4.1 06/01/2017 0833   AST 14 06/01/2017 0833   ALT 11 06/01/2017 0833   ALKPHOS 83 06/01/2017 0833    BILITOT 0.4 06/01/2017 0833      Component Value Date/Time   TSH 2.140 11/12/2016 1128   TSH 2.06 05/21/2016 1026   TSH 1.959 02/24/2011 1132   Results for Shoaf, Korena A (MRN [id number]) as of 11/09/2017 13:28  Ref. Range 06/01/2017 08:33  Vitamin D, 25-Hydroxy Latest Ref Range: 30.0 - 100.0 ng/mL 42.7   ASSESSMENT AND PLAN: Prediabetes - Plan: metFORMIN (GLUCOPHAGE) 500 MG tablet  Vitamin D deficiency - Plan: Vitamin D, Ergocalciferol, (DRISDOL) 50000 units CAPS capsule  At risk for diabetes mellitus  Class 2 severe obesity with serious comorbidity and body mass index (BMI) of 36.0 to 36.9 in adult, unspecified obesity type [facility] [place](HCC)  PLAN:  Pre-Diabetes Lauren Burns will continue to work on weight loss, exercise, and decreasing simple carbohydrates in her diet to help decrease the risk of diabetes. We dicussed metformin including benefits and risks. She was informed that eating too many simple carbohydrates or too many calories at one sitting increases the likelihood of GI side effects. Karlie requested metformin for now and a prescription was written today for 1 month refill. Tajia agreed to follow up with us as directed to monitor her progress.  Diabetes risk counseling Lauren Burns was given extended (15 minutes) diabetes prevention counseling today. She is 42 y.o. female and has risk factors for diabetes including obesity and prediabetes. We discussed intensive lifestyle modifications today with an emphasis on weight loss as well as increasing exercise and decreasing simple carbohydrates in her diet.  Vitamin D Deficiency Lauren Burns was informed that low vitamin D levels contributes to fatigue and are associated with obesity, breast, and colon cancer. She agrees to continue to take prescription Vit D @50 ,000 IU every week #4 with no refills and will follow up for routine testing of vitamin D, at least 2-3 times per year. She was informed of the risk of over-replacement of vitamin D and agrees  to not increase her dose unless she discusses this with us first. Lauren Burns agreed to follow up as directed.  Obesity Lauren Burns is currently in the action stage of change. As such, her goal is to continue with weight loss efforts She has agreed to keep a food journal with 1600 calories and 90 grams of protein daily Lauren Burns has been instructed to work up to a goal of 150 minutes of combined cardio and strengthening exercise per week for weight loss and overall health benefits. We discussed the following Behavioral Modification Strategies today: work on meal planning and easy cooking plans and planning for success  Lauren Burns has agreed to follow up with our clinic in 3 weeks. She was informed of the importance of frequent follow up visits to maximize her success with intensive lifestyle modifications for her multiple health conditions.   OBESITY BEHAVIORAL INTERVENTION VISIT  Today's visit was # 20 out of 22.  Starting weight: 320 lbs Starting date: 11/12/16 Today's weight : 237 lbs  Today's date: 11/09/2017 Total lbs lost to date: 6083    ASK: We discussed the diagnosis of obesity with Lauren Bonitohristy A Zajkowski today and Brynja agreed to give us permission to discuss obesity behavioral modification therapy today.  ASSESS: Lauren Burns has the diagnosis of obesity and her BMI today is 36.04 Lauren Burns is in the action stage of change   ADVISE: Lauren Burns was educated on the multiple health risks of obesity as well as the benefit of weight loss to improve her health. She was advised of the need for long term treatment and the importance of lifestyle modifications.  AGREE: Multiple dietary modification options and treatment options were discussed and  Mahala agreed to the above obesity treatment plan.  Cristi LoronI, Joanne Murray, am acting as transcriptionist for Alois Clicheracey Candis Kabel, PA-C I, Alois Clicheracey Ojas Coone, PA-C have reviewed above note and agree with its content

## 2017-11-11 ENCOUNTER — Other Ambulatory Visit: Payer: BLUE CROSS/BLUE SHIELD | Admitting: Internal Medicine

## 2017-11-11 ENCOUNTER — Ambulatory Visit: Payer: BLUE CROSS/BLUE SHIELD | Admitting: Internal Medicine

## 2017-11-22 NOTE — Patient Instructions (Signed)
Continue same medications and follow-up in 6 weeks.  See GYN physician regarding pelvic discomfort.

## 2017-11-30 ENCOUNTER — Ambulatory Visit (INDEPENDENT_AMBULATORY_CARE_PROVIDER_SITE_OTHER): Payer: BLUE CROSS/BLUE SHIELD | Admitting: Physician Assistant

## 2017-11-30 ENCOUNTER — Encounter (INDEPENDENT_AMBULATORY_CARE_PROVIDER_SITE_OTHER): Payer: Self-pay | Admitting: Physician Assistant

## 2017-11-30 VITALS — BP 113/77 | HR 71 | Temp 98.3°F | Ht 68.0 in | Wt 235.0 lb

## 2017-11-30 DIAGNOSIS — E559 Vitamin D deficiency, unspecified: Secondary | ICD-10-CM | POA: Diagnosis not present

## 2017-11-30 DIAGNOSIS — E7849 Other hyperlipidemia: Secondary | ICD-10-CM | POA: Diagnosis not present

## 2017-11-30 DIAGNOSIS — Z9189 Other specified personal risk factors, not elsewhere classified: Secondary | ICD-10-CM | POA: Diagnosis not present

## 2017-11-30 DIAGNOSIS — E8881 Metabolic syndrome: Secondary | ICD-10-CM | POA: Diagnosis not present

## 2017-11-30 DIAGNOSIS — Z6835 Body mass index (BMI) 35.0-35.9, adult: Secondary | ICD-10-CM

## 2017-11-30 MED ORDER — VITAMIN D (ERGOCALCIFEROL) 1.25 MG (50000 UNIT) PO CAPS: 50000.0000 [IU] | ORAL_CAPSULE | ORAL | 0 refills | Status: DC

## 2017-11-30 NOTE — Progress Notes (Signed)
Office: [phone]  /  Fax: [phone]   HPI:   Chief Complaint: OBESITY Lauren Burns is here to discuss her progress with her obesity treatment plan. She is on the  keep a food journal with 1600 calories and 90 grams of protein daily and is following her eating plan approximately 80 % of the time. She states she is exercising 0 minutes 0 times per week. Lauren Burns did well with weight loss. She reports getting back on track in the last couple of weeks. Lauren Burns notes that she is getting bored and is needing new recipes. Her weight is 235 lb (106.6 kg) today and has had a weight loss of 2 pounds over a period of 3 weeks since her last visit. She has lost 85 lbs since starting treatment with [place].  Vitamin D deficiency Lauren Burns has a diagnosis of vitamin D deficiency. She is currently taking vit D and her last level was not at goal. Jarrett denies nausea, vomiting or muscle weakness.  At risk for osteopenia and osteoporosis Lauren Burns is at higher risk of osteopenia and osteoporosis due to vitamin D deficiency.   Hyperlipidemia Lauren Burns has hyperlipidemia and has been trying to improve her cholesterol levels with intensive lifestyle modification including a low saturated fat diet, exercise and weight loss. She denies any chest pain.  Insulin Resistance Lauren Burns has a diagnosis of insulin resistance based on her elevated fasting insulin level >5. Although Lauren Burns's blood glucose readings are still under good control, insulin resistance puts her at greater risk of metabolic syndrome and diabetes. She is taking metformin currently and continues to work on diet and exercise to decrease risk of diabetes. Lauren Burns denies nausea, vomiting or diarrhea.  ALLERGIES: No Known Allergies  MEDICATIONS: Current Outpatient Medications on File Prior to Visit  Medication Sig Dispense Refill  . buPROPion (WELLBUTRIN SR) 200 MG 12 hr tablet Take 1 tablet (200 mg total) by mouth daily at 12 noon. 90 tablet 0  .  DULoxetine (CYMBALTA) 30 MG capsule Take 1 capsule (30 mg total) by mouth daily. 30 capsule 5  . losartan (COZAAR) 50 MG tablet Take 1 tablet (50 mg total) daily by mouth. (Patient taking differently: Take 25 mg by mouth daily. ) 90 tablet 3  . metFORMIN (GLUCOPHAGE) 500 MG tablet Take 1 tablet (500 mg total) by mouth 2 (two) times daily with a meal. 60 tablet 0  . warfarin (COUMADIN) 1 MG tablet Take 1 tablet (1 mg total) by mouth daily. 30 tablet 0  . warfarin (COUMADIN) 6 MG tablet Take 1 tablet (6 mg total) by mouth daily. 30 tablet 0   No current facility-administered medications on file prior to visit.     PAST MEDICAL HISTORY: Past Medical History:  Diagnosis Date  . Antiphospholipid antibody syndrome (HCC)   . Anxiety   . Arthritis   . Depression   . Endometriosis   . Endometriosis   . Fatigue   . Fibromyalgia   . GERD (gastroesophageal reflux disease)   . Heart murmur   . Hypertension   . IBS (irritable bowel syndrome)   . IBS (irritable bowel syndrome)   . Obesity   . Pulmonary embolism (HCC) 2004  . Shortness of breath   . Sleep apnea   . Vitamin D deficiency     PAST SURGICAL HISTORY: No past surgical history on file.  SOCIAL HISTORY: Social History   Tobacco Use  . Smoking status: Former Smoker    Last attempt to quit: 12/17/2008    Years  since quitting: 8.9  . Smokeless tobacco: Never Used  Substance Use Topics  . Alcohol use: No  . Drug use: No    FAMILY HISTORY: Family History  Problem Relation Age of Onset  . Hyperlipidemia Mother   . Depression Mother   . Anxiety disorder Mother   . Obesity Mother   . Heart disease Father   . Alcohol abuse Father   . Hypertension Father   . Cancer Father   . Alcoholism Father   . Obesity Father     ROS: Review of Systems  Constitutional: Positive for weight loss.  Cardiovascular: Negative for chest pain.  Gastrointestinal: Negative for diarrhea, nausea and vomiting.  Musculoskeletal:       Negative  for muscle weakness    PHYSICAL EXAM: Blood pressure 113/77, pulse 71, temperature 98.3 F (36.8 C), temperature source Oral, height 5\' 8"  (1.727 m), weight 235 lb (106.6 kg), SpO2 97 %. Body mass index is 35.73 kg/m. Physical Exam  Constitutional: She is oriented to person, place, and time. She appears well-developed and well-nourished.  Cardiovascular: Normal rate.  Pulmonary/Chest: Effort normal.  Musculoskeletal: Normal range of motion.  Neurological: She is oriented to person, place, and time.  Skin: Skin is warm and dry.  Psychiatric: She has a normal mood and affect. Her behavior is normal.  Vitals reviewed.   RECENT LABS AND TESTS: BMET    Component Value Date/Time   NA 139 06/01/2017 0833   K 4.4 06/01/2017 0833   CL 101 06/01/2017 0833   CO2 25 06/01/2017 0833   GLUCOSE 84 06/01/2017 0833   GLUCOSE 97 05/21/2016 1026   BUN 16 06/01/2017 0833   CREATININE 0.77 06/01/2017 0833   CREATININE 0.80 05/21/2016 1026   CALCIUM 9.0 06/01/2017 0833   GFRNONAA 96 06/01/2017 0833   GFRAA 110 06/01/2017 0833   Lab Results  Component Value Date   HGBA1C 5.3 06/01/2017   HGBA1C 5.7 (H) 02/16/2017   HGBA1C 5.5 11/12/2016   Lab Results  Component Value Date   INSULIN 6.4 06/01/2017   INSULIN 9.0 02/16/2017   INSULIN 10.7 11/12/2016   CBC    Component Value Date/Time   WBC 5.9 11/12/2016 1128   WBC 5.6 05/21/2016 1026   RBC 4.61 11/12/2016 1128   RBC 4.72 05/21/2016 1026   HGB 13.7 11/12/2016 1128   HCT 41.2 11/12/2016 1128   PLT 297 11/12/2016 1128   MCV 89 11/12/2016 1128   MCH 29.7 11/12/2016 1128   MCH 28.8 05/21/2016 1026   MCHC 33.3 11/12/2016 1128   MCHC 32.4 05/21/2016 1026   RDW 13.1 11/12/2016 1128   LYMPHSABS 1.6 11/12/2016 1128   MONOABS 336 05/21/2016 1026   EOSABS 0.3 11/12/2016 1128   BASOSABS 0.0 11/12/2016 1128   Iron/TIBC/Ferritin/ %Sat No results found for: IRON, TIBC, FERRITIN, IRONPCTSAT Lipid Panel     Component Value Date/Time    CHOL 189 06/01/2017 0833   TRIG 80 06/01/2017 0833   HDL 45 06/01/2017 0833   CHOLHDL 3.9 05/21/2016 1026   VLDL 13 05/21/2016 1026   LDLCALC 128 (H) 06/01/2017 0833   Hepatic Function Panel     Component Value Date/Time   PROT 6.8 06/01/2017 0833   ALBUMIN 4.1 06/01/2017 0833   AST 14 06/01/2017 0833   ALT 11 06/01/2017 0833   ALKPHOS 83 06/01/2017 0833   BILITOT 0.4 06/01/2017 0833      Component Value Date/Time   TSH 2.140 11/12/2016 1128   TSH 2.06 05/21/2016 1026  TSH 1.959 02/24/2011 1132   Results for TIFANI, DACK (MRN [id number]) as of 11/30/2017 15:48  Ref. Range 06/01/2017 08:33  Vitamin D, 25-Hydroxy Latest Ref Range: 30.0 - 100.0 ng/mL 42.7   ASSESSMENT AND PLAN: Vitamin D deficiency - Plan: VITAMIN D 25 Hydroxy (Vit-D Deficiency, Fractures), Vitamin D, Ergocalciferol, (DRISDOL) 50000 units CAPS capsule  Other hyperlipidemia - Plan: Lipid Panel With LDL/HDL Ratio  Insulin resistance - Plan: Comprehensive metabolic panel, Hemoglobin A1c, Insulin, random  At risk for osteoporosis  Class 2 severe obesity with serious comorbidity and body mass index (BMI) of 35.0 to 35.9 in adult, unspecified obesity type (HCC)  PLAN:  Vitamin D Deficiency Mikita was informed that low vitamin D levels contributes to fatigue and are associated with obesity, breast, and colon cancer. She agrees to continue to take prescription Vit D @50 ,000 IU every week and will follow up for routine testing of vitamin D, at least 2-3 times per year. She was informed of the risk of over-replacement of vitamin D and agrees to not increase her dose unless she discusses this with [place] first. We will check labs and Hoa will follow up as directed.  At risk for osteopenia and osteoporosis Alura was given extended  (15 minutes) osteoporosis prevention counseling today. Maha is at risk for osteopenia and osteoporosis due to her vitamin D deficiency. She was encouraged to take her vitamin D and  follow her higher calcium diet and increase strengthening exercise to help strengthen her bones and decrease her risk of osteopenia and osteoporosis.  Hyperlipidemia Jazlyne was informed of the American Heart Association Guidelines emphasizing intensive lifestyle modifications as the first line treatment for hyperlipidemia. We discussed many lifestyle modifications today in depth, and Donn will continue to work on decreasing saturated fats such as fatty red meat, butter and many fried foods. She will also increase vegetables and lean protein in her diet and continue to work on exercise and weight loss efforts. We will check labs and Jyllian will follow up as directed.  Insulin Resistance Seraya will continue to work on weight loss, exercise, and decreasing simple carbohydrates in her diet to help decrease the risk of diabetes. We dicussed metformin including benefits and risks. She was informed that eating too many simple carbohydrates or too many calories at one sitting increases the likelihood of GI side effects. Yitta will continue metformin for now and prescription was not written today. We will check labs and Ernie agreed to follow up with [place] as directed to monitor her progress.  Obesity Caytlin is currently in the action stage of change. As such, her goal is to continue with weight loss efforts She has agreed to keep a food journal with 1600 calories and 90 grams of protein daily Anthonella has been instructed to work up to a goal of 150 minutes of combined cardio and strengthening exercise per week for weight loss and overall health benefits. We discussed the following Behavioral Modification Strategies today: work on meal planning and easy cooking plans and ways to avoid boredom eating  Leshae has agreed to follow up with our clinic in 2 to 3 weeks. She was informed of the importance of frequent follow up visits to maximize her success with intensive lifestyle modifications for her  multiple health conditions.   OBESITY BEHAVIORAL INTERVENTION VISIT  Today's visit was # 21   Starting weight: 320 lbs Starting date: 11/12/16 Today's weight : 235 lbs  Today's date: 11/30/2017 Total lbs lost to date: 85   ASK:  We discussed the diagnosis of obesity with Neysa Bonito A Strauser today and Chiniqua agreed to give [place] permission to discuss obesity behavioral modification therapy today.  ASSESS: Nayvie has the diagnosis of obesity and her BMI today is 35.74 Jimya is in the action stage of change   ADVISE: Tikia was educated on the multiple health risks of obesity as well as the benefit of weight loss to improve her health. She was advised of the need for long term treatment and the importance of lifestyle modifications to improve her current health and to decrease her risk of future health problems.  AGREE: Multiple dietary modification options and treatment options were discussed and  Daiya agreed to follow the recommendations documented in the above note.  ARRANGE: Heidi was educated on the importance of frequent visits to treat obesity as outlined per CMS and USPSTF guidelines and agreed to schedule her next follow up appointment today.  Cristi Loron, am acting as transcriptionist for Alois Cliche, PA-C I, Alois Cliche, PA-C have reviewed above note and agree with its content

## 2017-12-01 DIAGNOSIS — N921 Excessive and frequent menstruation with irregular cycle: Secondary | ICD-10-CM | POA: Diagnosis not present

## 2017-12-01 DIAGNOSIS — N760 Acute vaginitis: Secondary | ICD-10-CM | POA: Diagnosis not present

## 2017-12-01 DIAGNOSIS — Z01419 Encounter for gynecological examination (general) (routine) without abnormal findings: Secondary | ICD-10-CM | POA: Diagnosis not present

## 2017-12-01 DIAGNOSIS — Z6836 Body mass index (BMI) 36.0-36.9, adult: Secondary | ICD-10-CM | POA: Diagnosis not present

## 2017-12-01 DIAGNOSIS — N911 Secondary amenorrhea: Secondary | ICD-10-CM | POA: Diagnosis not present

## 2017-12-01 DIAGNOSIS — Z113 Encounter for screening for infections with a predominantly sexual mode of transmission: Secondary | ICD-10-CM | POA: Diagnosis not present

## 2017-12-01 LAB — COMPREHENSIVE METABOLIC PANEL
A/G RATIO: 1.8 (ref 1.2–2.2)
ALT: 13 IU/L (ref 0–32)
AST: 16 IU/L (ref 0–40)
Albumin: 4.4 g/dL (ref 3.5–5.5)
Alkaline Phosphatase: 78 IU/L (ref 39–117)
BUN/Creatinine Ratio: 18 (ref 9–23)
BUN: 16 mg/dL (ref 6–24)
Bilirubin Total: 0.6 mg/dL (ref 0.0–1.2)
CALCIUM: 9.2 mg/dL (ref 8.7–10.2)
CHLORIDE: 96 mmol/L (ref 96–106)
CO2: 23 mmol/L (ref 20–29)
Creatinine, Ser: 0.87 mg/dL (ref 0.57–1.00)
GFR calc Af Amer: 94 mL/min/{1.73_m2} (ref >59)
GFR, EST NON AFRICAN AMERICAN: 82 mL/min/{1.73_m2} (ref >59)
GLUCOSE: 43 mg/dL — AB (ref 65–99)
Globulin, Total: 2.5 g/dL (ref 1.5–4.5)
Potassium: 3.9 mmol/L (ref 3.5–5.2)
Sodium: 141 mmol/L (ref 134–144)
Total Protein: 6.9 g/dL (ref 6.0–8.5)

## 2017-12-01 LAB — INSULIN, RANDOM: INSULIN: 5.8 u[IU]/mL (ref 2.6–24.9)

## 2017-12-01 LAB — VITAMIN D 25 HYDROXY (VIT D DEFICIENCY, FRACTURES): Vit D, 25-Hydroxy: 45.2 ng/mL (ref 30.0–100.0)

## 2017-12-01 LAB — LIPID PANEL WITH LDL/HDL RATIO
CHOLESTEROL TOTAL: 205 mg/dL — AB (ref 100–199)
HDL: 57 mg/dL (ref >39)
LDL Calculated: 133 mg/dL — ABNORMAL HIGH (ref 0–99)
LDl/HDL Ratio: 2.3 ratio (ref 0.0–3.2)
TRIGLYCERIDES: 73 mg/dL (ref 0–149)
VLDL Cholesterol Cal: 15 mg/dL (ref 5–40)

## 2017-12-01 LAB — HEMOGLOBIN A1C
Est. average glucose Bld gHb Est-mCnc: 108 mg/dL
HEMOGLOBIN A1C: 5.4 % (ref 4.8–5.6)

## 2017-12-09 DIAGNOSIS — N898 Other specified noninflammatory disorders of vagina: Secondary | ICD-10-CM | POA: Diagnosis not present

## 2017-12-09 DIAGNOSIS — N911 Secondary amenorrhea: Secondary | ICD-10-CM | POA: Diagnosis not present

## 2017-12-14 ENCOUNTER — Ambulatory Visit (INDEPENDENT_AMBULATORY_CARE_PROVIDER_SITE_OTHER): Payer: Self-pay | Admitting: Physician Assistant

## 2017-12-14 DIAGNOSIS — Z1231 Encounter for screening mammogram for malignant neoplasm of breast: Secondary | ICD-10-CM | POA: Diagnosis not present

## 2017-12-16 ENCOUNTER — Encounter: Payer: Self-pay | Admitting: Internal Medicine

## 2017-12-16 ENCOUNTER — Ambulatory Visit: Payer: BLUE CROSS/BLUE SHIELD | Admitting: Internal Medicine

## 2017-12-16 ENCOUNTER — Other Ambulatory Visit: Payer: BLUE CROSS/BLUE SHIELD | Admitting: Internal Medicine

## 2017-12-16 VITALS — Ht 68.0 in

## 2017-12-16 DIAGNOSIS — Z7901 Long term (current) use of anticoagulants: Secondary | ICD-10-CM

## 2017-12-16 DIAGNOSIS — D6861 Antiphospholipid syndrome: Secondary | ICD-10-CM

## 2017-12-16 DIAGNOSIS — Z23 Encounter for immunization: Secondary | ICD-10-CM | POA: Diagnosis not present

## 2017-12-16 LAB — PROTIME-INR
INR: 1.9 — ABNORMAL HIGH
Prothrombin Time: 20 s — ABNORMAL HIGH (ref 9.0–11.5)

## 2017-12-16 MED ORDER — WARFARIN SODIUM 6 MG PO TABS: 6.0000 mg | ORAL_TABLET | Freq: Every day | ORAL | 0 refills | Status: DC

## 2017-12-16 NOTE — Progress Notes (Signed)
   Subjective:    Patient ID: Lauren Burns, female    DOB: 11/06/1975, 42 y.o.   MRN: [id number]  HPI She was here August 8 on Coumadin 7 mg daily and pro time INR was stable at 2.7.  For some reason, pro time INR is 1.9 now.  Not sure if it is due to dietary changes.  Denies noncompliance with medication.    Review of Systems     Objective:   Physical Exam  Not examined but spent 10 minutes speaking with her about diet as it relates to Coumadin.  Not sure why pro time INR is subtherapeutic today.      Assessment & Plan:  Antiphospholipid antibody syndrome  Chronic anticoagulation  Subtherapeutic INR  Plan: Patient would like to continue with same dose and will follow-up October 3.  Flu vaccine given.

## 2017-12-22 ENCOUNTER — Ambulatory Visit (INDEPENDENT_AMBULATORY_CARE_PROVIDER_SITE_OTHER): Payer: BLUE CROSS/BLUE SHIELD | Admitting: Physician Assistant

## 2017-12-22 VITALS — BP 121/81 | HR 71 | Temp 97.6°F | Ht 68.0 in | Wt 233.0 lb

## 2017-12-22 DIAGNOSIS — Z6835 Body mass index (BMI) 35.0-35.9, adult: Secondary | ICD-10-CM

## 2017-12-22 DIAGNOSIS — E8881 Metabolic syndrome: Secondary | ICD-10-CM | POA: Diagnosis not present

## 2017-12-22 DIAGNOSIS — E559 Vitamin D deficiency, unspecified: Secondary | ICD-10-CM | POA: Diagnosis not present

## 2017-12-22 DIAGNOSIS — Z9189 Other specified personal risk factors, not elsewhere classified: Secondary | ICD-10-CM

## 2017-12-22 MED ORDER — VITAMIN D (ERGOCALCIFEROL) 1.25 MG (50000 UNIT) PO CAPS: 50000.0000 [IU] | ORAL_CAPSULE | ORAL | 0 refills | Status: DC

## 2017-12-22 NOTE — Patient Instructions (Signed)
Flu vaccine given.  Return October 3 for follow-up.

## 2017-12-23 NOTE — Progress Notes (Signed)
Office: [phone]  /  Fax: [phone]   HPI:   Chief Complaint: OBESITY Lauren Burns is here to discuss her progress with her obesity treatment plan. She is on the keep a food journal with 1600 calories and 90 grams of protein daily and is following her eating plan approximately 80 % of the time. She states she is walking 10,000 steps 5 times per week. Lauren Burns did very well with weight loss. She reports not journaling on some days. She states that she has been eating out a little more than normal.  Her weight is 233 lb (105.7 kg) today and has had a weight loss of 2 pounds over a period of 3 weeks since her last visit. She has lost 87 lbs since starting treatment with [place].  Vitamin D Deficiency Lauren Burns has a diagnosis of vitamin D deficiency. She is on prescription Vit D, last level not at goal. She denies nausea, vomiting or muscle weakness.  At risk for osteopenia and osteoporosis Lauren Burns is at higher risk of osteopenia and osteoporosis due to vitamin D deficiency.   Insulin Resistance Lauren Burns has a diagnosis of insulin resistance based on her elevated fasting insulin level >5. Although Lauren Burns's blood glucose readings are still under good control, insulin resistance puts her at greater risk of metabolic syndrome and diabetes. She is taking metformin currently and continues to work on diet and exercise to decrease risk of diabetes. She denies polyphagia.  ALLERGIES: No Known Allergies  MEDICATIONS: Current Outpatient Medications on File Prior to Visit  Medication Sig Dispense Refill  . buPROPion (WELLBUTRIN SR) 200 MG 12 hr tablet Take 1 tablet (200 mg total) by mouth daily at 12 noon. 90 tablet 0  . DULoxetine (CYMBALTA) 30 MG capsule Take 1 capsule (30 mg total) by mouth daily. 30 capsule 5  . losartan (COZAAR) 50 MG tablet Take 1 tablet (50 mg total) daily by mouth. (Patient taking differently: Take 25 mg by mouth daily. ) 90 tablet 3  . metFORMIN (GLUCOPHAGE) 500 MG tablet Take 1  tablet (500 mg total) by mouth 2 (two) times daily with a meal. (Patient taking differently: Take 500 mg by mouth daily with breakfast. ) 60 tablet 0  . warfarin (COUMADIN) 1 MG tablet Take 1 tablet (1 mg total) by mouth daily. 30 tablet 0  . warfarin (COUMADIN) 6 MG tablet Take 1 tablet (6 mg total) by mouth daily. 30 tablet 0   No current facility-administered medications on file prior to visit.     PAST MEDICAL HISTORY: Past Medical History:  Diagnosis Date  . Antiphospholipid antibody syndrome (HCC)   . Anxiety   . Arthritis   . Depression   . Endometriosis   . Endometriosis   . Fatigue   . Fibromyalgia   . GERD (gastroesophageal reflux disease)   . Heart murmur   . Hypertension   . IBS (irritable bowel syndrome)   . IBS (irritable bowel syndrome)   . Obesity   . Pulmonary embolism (HCC) 2004  . Shortness of breath   . Sleep apnea   . Vitamin D deficiency     PAST SURGICAL HISTORY: No past surgical history on file.  SOCIAL HISTORY: Social History   Tobacco Use  . Smoking status: Former Smoker    Last attempt to quit: 12/17/2008    Years since quitting: 9.0  . Smokeless tobacco: Never Used  Substance Use Topics  . Alcohol use: No  . Drug use: No    FAMILY HISTORY: Family History  Problem Relation Age of Onset  . Hyperlipidemia Mother   . Depression Mother   . Anxiety disorder Mother   . Obesity Mother   . Heart disease Father   . Alcohol abuse Father   . Hypertension Father   . Cancer Father   . Alcoholism Father   . Obesity Father     ROS: Review of Systems  Constitutional: Positive for weight loss.  Gastrointestinal: Negative for nausea and vomiting.  Musculoskeletal:       Negative muscle weakness  Endo/Heme/Allergies:       Negative polyphagia    PHYSICAL EXAM: Blood pressure 121/81, pulse 71, temperature 97.6 F (36.4 C), temperature source Oral, height 5\' 8"  (1.727 m), weight 233 lb (105.7 kg), SpO2 100 %. Body mass index is 35.43  kg/m. Physical Exam  Constitutional: She is oriented to person, place, and time. She appears well-developed and well-nourished.  Cardiovascular: Normal rate.  Pulmonary/Chest: Effort normal.  Musculoskeletal: Normal range of motion.  Neurological: She is oriented to person, place, and time.  Skin: Skin is warm and dry.  Psychiatric: She has a normal mood and affect. Her behavior is normal.  Vitals reviewed.   RECENT LABS AND TESTS: BMET    Component Value Date/Time   NA 141 11/30/2017 1153   K 3.9 11/30/2017 1153   CL 96 11/30/2017 1153   CO2 23 11/30/2017 1153   GLUCOSE 43 (L) 11/30/2017 1153   GLUCOSE 97 05/21/2016 1026   BUN 16 11/30/2017 1153   CREATININE 0.87 11/30/2017 1153   CREATININE 0.80 05/21/2016 1026   CALCIUM 9.2 11/30/2017 1153   GFRNONAA 82 11/30/2017 1153   GFRAA 94 11/30/2017 1153   Lab Results  Component Value Date   HGBA1C 5.4 11/30/2017   HGBA1C 5.3 06/01/2017   HGBA1C 5.7 (H) 02/16/2017   HGBA1C 5.5 11/12/2016   Lab Results  Component Value Date   INSULIN 5.8 11/30/2017   INSULIN 6.4 06/01/2017   INSULIN 9.0 02/16/2017   INSULIN 10.7 11/12/2016   CBC    Component Value Date/Time   WBC 5.9 11/12/2016 1128   WBC 5.6 05/21/2016 1026   RBC 4.61 11/12/2016 1128   RBC 4.72 05/21/2016 1026   HGB 13.7 11/12/2016 1128   HCT 41.2 11/12/2016 1128   PLT 297 11/12/2016 1128   MCV 89 11/12/2016 1128   MCH 29.7 11/12/2016 1128   MCH 28.8 05/21/2016 1026   MCHC 33.3 11/12/2016 1128   MCHC 32.4 05/21/2016 1026   RDW 13.1 11/12/2016 1128   LYMPHSABS 1.6 11/12/2016 1128   MONOABS 336 05/21/2016 1026   EOSABS 0.3 11/12/2016 1128   BASOSABS 0.0 11/12/2016 1128   Iron/TIBC/Ferritin/ %Sat No results found for: IRON, TIBC, FERRITIN, IRONPCTSAT Lipid Panel     Component Value Date/Time   CHOL 205 (H) 11/30/2017 1153   TRIG 73 11/30/2017 1153   HDL 57 11/30/2017 1153   CHOLHDL 3.9 05/21/2016 1026   VLDL 13 05/21/2016 1026   LDLCALC 133 (H)  11/30/2017 1153   Hepatic Function Panel     Component Value Date/Time   PROT 6.9 11/30/2017 1153   ALBUMIN 4.4 11/30/2017 1153   AST 16 11/30/2017 1153   ALT 13 11/30/2017 1153   ALKPHOS 78 11/30/2017 1153   BILITOT 0.6 11/30/2017 1153      Component Value Date/Time   TSH 2.140 11/12/2016 1128   TSH 2.06 05/21/2016 1026   TSH 1.959 02/24/2011 1132  Results for Cade, Kyrstal A (MRN [id number]) as of 12/23/2017  09:13  Ref. Range 11/30/2017 11:53  Vitamin D, 25-Hydroxy Latest Ref Range: 30.0 - 100.0 ng/mL 45.2    ASSESSMENT AND PLAN: Vitamin D deficiency - Plan: Vitamin D, Ergocalciferol, (DRISDOL) 50000 units CAPS capsule  Insulin resistance  At risk for osteoporosis  Class 2 severe obesity with serious comorbidity and body mass index (BMI) of 35.0 to 35.9 in adult, unspecified obesity type (HCC)  PLAN:  Vitamin D Deficiency Lauren Burns was informed that low vitamin D levels contributes to fatigue and are associated with obesity, breast, and colon cancer. Lauren Burns agrees to continue taking prescription Vit D @50 ,000 IU every week #4 and we will refill for 1 month. She will follow up for routine testing of vitamin D, at least 2-3 times per year. She was informed of the risk of over-replacement of vitamin D and agrees to not increase her dose unless she discusses this with [place] first. Lauren Burns agrees to follow up with our clinic in 3 weeks.  At risk for osteopenia and osteoporosis Lauren Burns was given extended  (15 minutes) osteoporosis prevention counseling today. Lauren Burns is at risk for osteopenia and osteoporsis due to her vitamin D deficiency. She was encouraged to take her vitamin D and follow her higher calcium diet and increase strengthening exercise to help strengthen her bones and decrease her risk of osteopenia and osteoporosis.  Insulin Resistance Lauren Burns will continue to work on weight loss, exercise, and decreasing simple carbohydrates in her diet to help decrease the risk of  diabetes. We dicussed metformin including benefits and risks. She was informed that eating too many simple carbohydrates or too many calories at one sitting increases the likelihood of GI side effects. Lauren Burns agrees to decrease metformin to once daily, and she agrees to follow up with our clinic in 3 weeks as directed to monitor her progress.  Obesity Lauren Burns is currently in the action stage of change. As such, her goal is to continue with weight loss efforts She has agreed to keep a food journal with 1600 calories and 90 grams of protein daily Lauren Burns has been instructed to work up to a goal of 150 minutes of combined cardio and strengthening exercise per week for weight loss and overall health benefits. We discussed the following Behavioral Modification Strategies today: decrease eating out, work on meal planning and easy cooking plans, and planning for success   Lauren Burns has agreed to follow up with our clinic in 3 weeks. She was informed of the importance of frequent follow up visits to maximize her success with intensive lifestyle modifications for her multiple health conditions.   OBESITY BEHAVIORAL INTERVENTION VISIT  Today's visit was # 22  Starting weight: 320 lbs Starting date: 11/12/16 Today's weight : 233 lbs  Today's date: 12/22/2017 Total lbs lost to date: 51    ASK: We discussed the diagnosis of obesity with Lauren Burns today and Lauren Burns agreed to give [place] permission to discuss obesity behavioral modification therapy today.  ASSESS: Lauren Burns has the diagnosis of obesity and her BMI today is 35.44 Lauren Burns is in the action stage of change   ADVISE: Lauren Burns was educated on the multiple health risks of obesity as well as the benefit of weight loss to improve her health. She was advised of the need for long term treatment and the importance of lifestyle modifications.  AGREE: Multiple dietary modification options and treatment options were discussed and  Lauren Burns agreed  to the above obesity treatment plan.  Trude Mcburney, am acting as transcriptionist for [employer],  PA-C I, Alois Cliche, PA-C have reviewed above note and agree with its content

## 2017-12-30 ENCOUNTER — Other Ambulatory Visit: Payer: Self-pay

## 2017-12-30 ENCOUNTER — Other Ambulatory Visit: Payer: BLUE CROSS/BLUE SHIELD | Admitting: Internal Medicine

## 2017-12-30 ENCOUNTER — Telehealth: Payer: Self-pay | Admitting: Internal Medicine

## 2017-12-30 DIAGNOSIS — Z7901 Long term (current) use of anticoagulants: Secondary | ICD-10-CM | POA: Diagnosis not present

## 2017-12-30 LAB — PROTIME-INR
INR: 1.7 — AB
PROTHROMBIN TIME: 17.9 s — AB (ref 9.0–11.5)

## 2017-12-30 MED ORDER — WARFARIN SODIUM 7.5 MG PO TABS: 7.5000 mg | ORAL_TABLET | Freq: Every day | ORAL | 0 refills | Status: DC

## 2017-12-30 MED ORDER — WARFARIN SODIUM 1 MG PO TABS: 1.0000 mg | ORAL_TABLET | Freq: Every day | ORAL | 0 refills | Status: DC

## 2017-12-30 NOTE — Telephone Encounter (Signed)
Subtherapeutic INR. Will need Coumadin 7.5 mg daily. There is no 7 mg  Tablet but there is a 7.5 mg tabley which will be called in Follow up 2 weeks

## 2018-01-12 ENCOUNTER — Ambulatory Visit (INDEPENDENT_AMBULATORY_CARE_PROVIDER_SITE_OTHER): Payer: BLUE CROSS/BLUE SHIELD | Admitting: Physician Assistant

## 2018-01-12 ENCOUNTER — Encounter (INDEPENDENT_AMBULATORY_CARE_PROVIDER_SITE_OTHER): Payer: Self-pay | Admitting: Physician Assistant

## 2018-01-12 VITALS — BP 123/80 | HR 65 | Temp 98.2°F | Ht 68.0 in | Wt 238.0 lb

## 2018-01-12 DIAGNOSIS — Z6836 Body mass index (BMI) 36.0-36.9, adult: Secondary | ICD-10-CM | POA: Diagnosis not present

## 2018-01-12 DIAGNOSIS — E559 Vitamin D deficiency, unspecified: Secondary | ICD-10-CM

## 2018-01-13 ENCOUNTER — Ambulatory Visit: Payer: BLUE CROSS/BLUE SHIELD | Admitting: Internal Medicine

## 2018-01-13 ENCOUNTER — Other Ambulatory Visit: Payer: BLUE CROSS/BLUE SHIELD | Admitting: Internal Medicine

## 2018-01-13 DIAGNOSIS — Z7901 Long term (current) use of anticoagulants: Secondary | ICD-10-CM | POA: Diagnosis not present

## 2018-01-13 LAB — PROTIME-INR
INR: 3.9 — ABNORMAL HIGH
Prothrombin Time: 37.1 s — ABNORMAL HIGH (ref 9.0–11.5)

## 2018-01-14 ENCOUNTER — Telehealth: Payer: Self-pay | Admitting: Internal Medicine

## 2018-01-14 NOTE — Telephone Encounter (Signed)
Called patient to advise of her PT/INR.  Dr. Lenord Fellers would like for patient to drop back to 7 on her Coumadin and and come back on Tuesday morning for another STAT PT/INR draw.  She already has an appointment on Tuesday afternoon at 4pm for RTC for follow up.    Asked patient if she had been doing 7.5 mg Coumadin; she advised she had.  Asked if anything else had been going on as to why this would've caused her PT to have been elevated.  She said; oh yes, I have been taking Elderberry Syrup for the last several weeks for my allergies.  Would that have caused it to be elevated, maybe so??   I said, I don't really know.  Have you stopped that?  She said, oh yes!  How long ago?  Probably a few weeks ago now.  I said, well, we'll need to repeat this draw on Tuesday morning as a STAT draw.    Patient verbalized understanding of our conversation.

## 2018-01-17 NOTE — Progress Notes (Signed)
Office: [phone]  /  Fax: [phone]   HPI:   Chief Complaint: OBESITY Lauren Burns is here to discuss her progress with her obesity treatment plan. She is on the keep a food journal with 1600 calories and 90 grams of protein daily and is following her eating plan approximately 55 % of the time. She states she is going to the gym for 60 minutes 2 times per week. Lauren Burns struggled to follow the plan due to a lot of stress at work. She notes that she is doing a lot of stress eating and eating a lot of simple carbohydrates. She is ready to get back on track.  Her weight is 238 lb (108 kg) today and has gained 5 pounds since her last visit. She has lost 82 lbs since starting treatment with [place].  Vitamin D Deficiency Lauren Burns has a diagnosis of vitamin D deficiency. She is currently taking prescription Vit D and denies nausea, vomiting or muscle weakness.  ALLERGIES: No Known Allergies  MEDICATIONS: Current Outpatient Medications on File Prior to Visit  Medication Sig Dispense Refill  . buPROPion (WELLBUTRIN SR) 200 MG 12 hr tablet Take 1 tablet (200 mg total) by mouth daily at 12 noon. 90 tablet 0  . DULoxetine (CYMBALTA) 30 MG capsule Take 1 capsule (30 mg total) by mouth daily. 30 capsule 5  . losartan (COZAAR) 50 MG tablet Take 1 tablet (50 mg total) daily by mouth. (Patient taking differently: Take 25 mg by mouth daily. ) 90 tablet 3  . metFORMIN (GLUCOPHAGE) 500 MG tablet Take 1 tablet (500 mg total) by mouth 2 (two) times daily with a meal. (Patient taking differently: Take 500 mg by mouth daily with breakfast. ) 60 tablet 0  . Vitamin D, Ergocalciferol, (DRISDOL) 50000 units CAPS capsule Take 1 capsule (50,000 Units total) by mouth every 7 (seven) days. 4 capsule 0  . warfarin (COUMADIN) 1 MG tablet Take 1 tablet (1 mg total) by mouth daily. 30 tablet 0  . warfarin (COUMADIN) 7.5 MG tablet Take 1 tablet (7.5 mg total) by mouth daily. 30 tablet 0   No current facility-administered  medications on file prior to visit.     PAST MEDICAL HISTORY: Past Medical History:  Diagnosis Date  . Antiphospholipid antibody syndrome (HCC)   . Anxiety   . Arthritis   . Depression   . Endometriosis   . Endometriosis   . Fatigue   . Fibromyalgia   . GERD (gastroesophageal reflux disease)   . Heart murmur   . Hypertension   . IBS (irritable bowel syndrome)   . IBS (irritable bowel syndrome)   . Obesity   . Pulmonary embolism (HCC) 2004  . Shortness of breath   . Sleep apnea   . Vitamin D deficiency     PAST SURGICAL HISTORY: No past surgical history on file.  SOCIAL HISTORY: Social History   Tobacco Use  . Smoking status: Former Smoker    Last attempt to quit: 12/17/2008    Years since quitting: 9.0  . Smokeless tobacco: Never Used  Substance Use Topics  . Alcohol use: No  . Drug use: No    FAMILY HISTORY: Family History  Problem Relation Age of Onset  . Hyperlipidemia Mother   . Depression Mother   . Anxiety disorder Mother   . Obesity Mother   . Heart disease Father   . Alcohol abuse Father   . Hypertension Father   . Cancer Father   . Alcoholism Father   .  Obesity Father     ROS: Review of Systems  Constitutional: Negative for weight loss.  Gastrointestinal: Negative for nausea and vomiting.  Musculoskeletal:       Negative muscle weakness    PHYSICAL EXAM: Blood pressure 123/80, pulse 65, temperature 98.2 F (36.8 C), temperature source Oral, height 5\' 8"  (1.727 m), weight 238 lb (108 kg), SpO2 99 %. Body mass index is 36.19 kg/m. Physical Exam  Constitutional: She is oriented to person, place, and time. She appears well-developed and well-nourished.  Cardiovascular: Normal rate.  Pulmonary/Chest: Effort normal.  Musculoskeletal: Normal range of motion.  Neurological: She is oriented to person, place, and time.  Skin: Skin is warm and dry.  Psychiatric: She has a normal mood and affect. Her behavior is normal.  Vitals  reviewed.   RECENT LABS AND TESTS: BMET    Component Value Date/Time   NA 141 11/30/2017 1153   K 3.9 11/30/2017 1153   CL 96 11/30/2017 1153   CO2 23 11/30/2017 1153   GLUCOSE 43 (L) 11/30/2017 1153   GLUCOSE 97 05/21/2016 1026   BUN 16 11/30/2017 1153   CREATININE 0.87 11/30/2017 1153   CREATININE 0.80 05/21/2016 1026   CALCIUM 9.2 11/30/2017 1153   GFRNONAA 82 11/30/2017 1153   GFRAA 94 11/30/2017 1153   Lab Results  Component Value Date   HGBA1C 5.4 11/30/2017   HGBA1C 5.3 06/01/2017   HGBA1C 5.7 (H) 02/16/2017   HGBA1C 5.5 11/12/2016   Lab Results  Component Value Date   INSULIN 5.8 11/30/2017   INSULIN 6.4 06/01/2017   INSULIN 9.0 02/16/2017   INSULIN 10.7 11/12/2016   CBC    Component Value Date/Time   WBC 5.9 11/12/2016 1128   WBC 5.6 05/21/2016 1026   RBC 4.61 11/12/2016 1128   RBC 4.72 05/21/2016 1026   HGB 13.7 11/12/2016 1128   HCT 41.2 11/12/2016 1128   PLT 297 11/12/2016 1128   MCV 89 11/12/2016 1128   MCH 29.7 11/12/2016 1128   MCH 28.8 05/21/2016 1026   MCHC 33.3 11/12/2016 1128   MCHC 32.4 05/21/2016 1026   RDW 13.1 11/12/2016 1128   LYMPHSABS 1.6 11/12/2016 1128   MONOABS 336 05/21/2016 1026   EOSABS 0.3 11/12/2016 1128   BASOSABS 0.0 11/12/2016 1128   Iron/TIBC/Ferritin/ %Sat No results found for: IRON, TIBC, FERRITIN, IRONPCTSAT Lipid Panel     Component Value Date/Time   CHOL 205 (H) 11/30/2017 1153   TRIG 73 11/30/2017 1153   HDL 57 11/30/2017 1153   CHOLHDL 3.9 05/21/2016 1026   VLDL 13 05/21/2016 1026   LDLCALC 133 (H) 11/30/2017 1153   Hepatic Function Panel     Component Value Date/Time   PROT 6.9 11/30/2017 1153   ALBUMIN 4.4 11/30/2017 1153   AST 16 11/30/2017 1153   ALT 13 11/30/2017 1153   ALKPHOS 78 11/30/2017 1153   BILITOT 0.6 11/30/2017 1153      Component Value Date/Time   TSH 2.140 11/12/2016 1128   TSH 2.06 05/21/2016 1026   TSH 1.959 02/24/2011 1132  Results for Bielby, Vernida A (MRN [id number])  as of 01/17/2018 08:50  Ref. Range 11/30/2017 11:53  Vitamin D, 25-Hydroxy Latest Ref Range: 30.0 - 100.0 ng/mL 45.2    ASSESSMENT AND PLAN: Vitamin D deficiency  Class 2 severe obesity with serious comorbidity and body mass index (BMI) of 36.0 to 36.9 in adult, unspecified obesity type (HCC)  PLAN:  Vitamin D Deficiency Lauren Burns was informed that low vitamin D levels contributes  to fatigue and are associated with obesity, breast, and colon cancer. Lauren Burns agrees to continue taking prescription Vit D @50 ,000 IU every week and will follow up for routine testing of vitamin D, at least 2-3 times per year. She was informed of the risk of over-replacement of vitamin D and agrees to not increase her dose unless she discusses this with [place] first. Lauren Burns agrees to follow up with our clinic in 3 weeks.  I spent > than 50% of the 15 minute visit on counseling as documented in the note.  Obesity Lauren Burns is currently in the action stage of change. As such, her goal is to continue with weight loss efforts She has agreed to change to follow the Category 3 plan + 100 calories Lauren Burns has been instructed to work up to a goal of 150 minutes of combined cardio and strengthening exercise per week for weight loss and overall health benefits. We discussed the following Behavioral Modification Strategies today: decreasing simple carbohydrates , decrease eating out and work on meal planning and easy cooking plans   Lauren Burns has agreed to follow up with our clinic in 3 weeks. She was informed of the importance of frequent follow up visits to maximize her success with intensive lifestyle modifications for her multiple health conditions.   OBESITY BEHAVIORAL INTERVENTION VISIT  Today's visit was # 23   Starting weight: 320 lbs Starting date: 11/12/16 Today's weight : 238 lbs  Today's date: 01/12/2018 Total lbs lost to date: 67    ASK: We discussed the diagnosis of obesity with Lauren Burns today and  Lauren Burns agreed to give [place] permission to discuss obesity behavioral modification therapy today.  ASSESS: Lauren Burns has the diagnosis of obesity and her BMI today is 36.2 Lauren Burns is in the action stage of change   ADVISE: Lauren Burns was educated on the multiple health risks of obesity as well as the benefit of weight loss to improve her health. She was advised of the need for long term treatment and the importance of lifestyle modifications.  AGREE: Multiple dietary modification options and treatment options were discussed and  Lauren Burns agreed to the above obesity treatment plan.  Trude Mcburney, am acting as transcriptionist for Alois Cliche, PA-C I, Alois Cliche, PA-C have reviewed above note and agree with its content

## 2018-01-18 ENCOUNTER — Ambulatory Visit: Payer: BLUE CROSS/BLUE SHIELD | Admitting: Internal Medicine

## 2018-01-18 ENCOUNTER — Other Ambulatory Visit: Payer: BLUE CROSS/BLUE SHIELD | Admitting: Internal Medicine

## 2018-01-18 ENCOUNTER — Encounter: Payer: Self-pay | Admitting: Internal Medicine

## 2018-01-18 VITALS — BP 120/74 | HR 73 | Temp 97.8°F | Ht 68.0 in | Wt 235.0 lb

## 2018-01-18 DIAGNOSIS — D6861 Antiphospholipid syndrome: Secondary | ICD-10-CM

## 2018-01-18 DIAGNOSIS — Z7901 Long term (current) use of anticoagulants: Secondary | ICD-10-CM | POA: Diagnosis not present

## 2018-01-18 LAB — PROTIME-INR
INR: 2.8 — ABNORMAL HIGH
Prothrombin Time: 26.9 s — ABNORMAL HIGH (ref 9.0–11.5)

## 2018-01-24 NOTE — Patient Instructions (Signed)
Continue 7 mg of Coumadin daily and follow-up in 4 weeks.

## 2018-01-24 NOTE — Progress Notes (Signed)
   Subjective:    Patient ID: Lauren Burns, female    DOB: 01/18/1976, 42 y.o.   MRN: [id number]  HPI Issues recently with pro time INR not being therapeutic.  History of antiphospholipid antibody syndrome maintained on chronic Coumadin therapy.  Patient says she was recently taking Elderberry syrup.  She wonders if this was interfering with her pro time INR.  Denies other dietary consumption that might would interfere with pro time INR.  She is a patient with Dr. Dalbert Garnet and has been successful with weight loss.  In early October she was on 7.5 mg of Coumadin after having subtherapeutic level of 1.7 on previous dose of Coumadin.  However on October 17 pro time INR was supratherapeutic at 3.9.  At that time Coumadin dose was reduced to 7 mg daily and she is now here for follow-up.  Today's pro time INR is excellent at 2.8    Review of Systems see above     Objective:   Physical Exam  Not examined.  Spent 10 minutes speaking with her about these issues      Assessment & Plan:  Chronic anticoagulation on Coumadin 7 mg daily with excellent pro time INR  Antiphospholipid antibody syndrome requiring chronic Coumadin therapy  Plan: Follow-up in 4 weeks.

## 2018-01-31 ENCOUNTER — Other Ambulatory Visit: Payer: Self-pay | Admitting: Internal Medicine

## 2018-02-02 ENCOUNTER — Encounter (INDEPENDENT_AMBULATORY_CARE_PROVIDER_SITE_OTHER): Payer: Self-pay | Admitting: Physician Assistant

## 2018-02-02 ENCOUNTER — Ambulatory Visit (INDEPENDENT_AMBULATORY_CARE_PROVIDER_SITE_OTHER): Payer: BLUE CROSS/BLUE SHIELD | Admitting: Physician Assistant

## 2018-02-02 VITALS — BP 128/82 | HR 72 | Temp 98.1°F | Ht 68.0 in | Wt 231.0 lb

## 2018-02-02 DIAGNOSIS — R7303 Prediabetes: Secondary | ICD-10-CM

## 2018-02-02 DIAGNOSIS — F3289 Other specified depressive episodes: Secondary | ICD-10-CM

## 2018-02-02 DIAGNOSIS — Z9189 Other specified personal risk factors, not elsewhere classified: Secondary | ICD-10-CM

## 2018-02-02 DIAGNOSIS — E559 Vitamin D deficiency, unspecified: Secondary | ICD-10-CM | POA: Diagnosis not present

## 2018-02-02 DIAGNOSIS — Z6835 Body mass index (BMI) 35.0-35.9, adult: Secondary | ICD-10-CM

## 2018-02-02 MED ORDER — VITAMIN D (ERGOCALCIFEROL) 1.25 MG (50000 UNIT) PO CAPS: 50000.0000 [IU] | ORAL_CAPSULE | ORAL | 0 refills | Status: DC

## 2018-02-02 MED ORDER — BUPROPION HCL ER (SR) 200 MG PO TB12: 200.0000 mg | ORAL_TABLET | Freq: Every day | ORAL | 0 refills | Status: DC

## 2018-02-02 MED ORDER — METFORMIN HCL 500 MG PO TABS: 500.0000 mg | ORAL_TABLET | Freq: Two times a day (BID) | ORAL | 0 refills | Status: DC

## 2018-02-03 NOTE — Progress Notes (Signed)
Office: [phone]  /  Fax: [phone]   HPI:   Chief Complaint: OBESITY Lauren Burns is here to discuss her progress with her obesity treatment plan. She is on the Category 3 plan + 100 calories and is following her eating plan approximately 98 % of the time. She states she is at gym for 60 minutes 2 times per week. Lauren Burns did very well with weight loss. She reports getting back on plan. She is feeling better and having decreased cravings.  Her weight is 231 lb (104.8 kg) today and has had a weight loss of 7 pounds over a period of 3 weeks since her last visit. She has lost 89 lbs since starting treatment with [place].  Vitamin D Deficiency Lauren Burns has a diagnosis of vitamin D deficiency. She is currently taking prescription Vit D and denies nausea, vomiting or muscle weakness.  At risk for osteopenia and osteoporosis Lauren Burns is at higher risk of osteopenia and osteoporosis due to vitamin D deficiency.   Insulin Resistance Lauren Burns has a diagnosis of insulin resistance based on her elevated fasting insulin level >5. Although Lauren Burns's blood glucose readings are still under good control, insulin resistance puts her at greater risk of metabolic syndrome and diabetes. She denies nausea, vomiting, or diarrhea on metformin and continues to work on diet and exercise to decrease risk of diabetes. She denies polyphagia.  Depression with emotional eating behaviors Lauren Burns reports decrease in cravings and her blood pressure is normal. Lauren Burns struggles with emotional eating and using food for comfort to the extent that it is negatively impacting her health. She often snacks when she is not hungry. Lauren Burns sometimes feels she is out of control and then feels guilty that she made poor food choices. She has been working on behavior modification techniques to help reduce her emotional eating and has been somewhat successful. She shows no sign of suicidal or homicidal ideations.  Depression screen PHQ 2/9  11/12/2016  Decreased Interest 1  Down, Depressed, Hopeless 1  PHQ - 2 Score 2  Altered sleeping 2  Tired, decreased energy 3  Change in appetite 1  Feeling bad or failure about yourself  1  Trouble concentrating 1  Moving slowly or fidgety/restless 0  PHQ-9 Score 10    ALLERGIES: No Known Allergies  MEDICATIONS: Current Outpatient Medications on File Prior to Visit  Medication Sig Dispense Refill  . DULoxetine (CYMBALTA) 30 MG capsule Take 1 capsule (30 mg total) by mouth daily. 30 capsule 5  . losartan (COZAAR) 50 MG tablet Take 1 tablet (50 mg total) daily by mouth. (Patient taking differently: Take 25 mg by mouth daily. ) 90 tablet 3  . warfarin (COUMADIN) 1 MG tablet Take 1 tablet (1 mg total) by mouth daily. 30 tablet 0  . warfarin (COUMADIN) 6 MG tablet TAKE 1 TABLET(6 MG) BY MOUTH DAILY 30 tablet 0  . warfarin (COUMADIN) 7.5 MG tablet Take 1 tablet (7.5 mg total) by mouth daily. 30 tablet 0   No current facility-administered medications on file prior to visit.     PAST MEDICAL HISTORY: Past Medical History:  Diagnosis Date  . Antiphospholipid antibody syndrome (HCC)   . Anxiety   . Arthritis   . Depression   . Endometriosis   . Endometriosis   . Fatigue   . Fibromyalgia   . GERD (gastroesophageal reflux disease)   . Heart murmur   . Hypertension   . IBS (irritable bowel syndrome)   . IBS (irritable bowel syndrome)   .  Obesity   . Pulmonary embolism (HCC) 2004  . Shortness of breath   . Sleep apnea   . Vitamin D deficiency     PAST SURGICAL HISTORY: No past surgical history on file.  SOCIAL HISTORY: Social History   Tobacco Use  . Smoking status: Former Smoker    Last attempt to quit: 12/17/2008    Years since quitting: 9.1  . Smokeless tobacco: Never Used  Substance Use Topics  . Alcohol use: No  . Drug use: No    FAMILY HISTORY: Family History  Problem Relation Age of Onset  . Hyperlipidemia Mother   . Depression Mother   . Anxiety  disorder Mother   . Obesity Mother   . Heart disease Father   . Alcohol abuse Father   . Hypertension Father   . Cancer Father   . Alcoholism Father   . Obesity Father     ROS: Review of Systems  Constitutional: Positive for weight loss.  Gastrointestinal: Negative for diarrhea, nausea and vomiting.  Musculoskeletal:       Negative muscle weakness  Endo/Heme/Allergies:       Negative polyphagia  Psychiatric/Behavioral: Positive for depression. Negative for suicidal ideas.    PHYSICAL EXAM: Blood pressure 128/82, pulse 72, temperature 98.1 F (36.7 C), temperature source Oral, height 5\' 8"  (1.727 m), weight 231 lb (104.8 kg), last menstrual period 12/21/2016, SpO2 100 %. Body mass index is 35.12 kg/m. Physical Exam  Constitutional: She is oriented to person, place, and time. She appears well-developed and well-nourished.  Cardiovascular: Normal rate.  Pulmonary/Chest: Effort normal.  Musculoskeletal: Normal range of motion.  Neurological: She is oriented to person, place, and time.  Skin: Skin is warm and dry.  Psychiatric: She has a normal mood and affect. Her behavior is normal.  Vitals reviewed.   RECENT LABS AND TESTS: BMET    Component Value Date/Time   NA 141 11/30/2017 1153   K 3.9 11/30/2017 1153   CL 96 11/30/2017 1153   CO2 23 11/30/2017 1153   GLUCOSE 43 (L) 11/30/2017 1153   GLUCOSE 97 05/21/2016 1026   BUN 16 11/30/2017 1153   CREATININE 0.87 11/30/2017 1153   CREATININE 0.80 05/21/2016 1026   CALCIUM 9.2 11/30/2017 1153   GFRNONAA 82 11/30/2017 1153   GFRAA 94 11/30/2017 1153   Lab Results  Component Value Date   HGBA1C 5.4 11/30/2017   HGBA1C 5.3 06/01/2017   HGBA1C 5.7 (H) 02/16/2017   HGBA1C 5.5 11/12/2016   Lab Results  Component Value Date   INSULIN 5.8 11/30/2017   INSULIN 6.4 06/01/2017   INSULIN 9.0 02/16/2017   INSULIN 10.7 11/12/2016   CBC    Component Value Date/Time   WBC 5.9 11/12/2016 1128   WBC 5.6 05/21/2016 1026    RBC 4.61 11/12/2016 1128   RBC 4.72 05/21/2016 1026   HGB 13.7 11/12/2016 1128   HCT 41.2 11/12/2016 1128   PLT 297 11/12/2016 1128   MCV 89 11/12/2016 1128   MCH 29.7 11/12/2016 1128   MCH 28.8 05/21/2016 1026   MCHC 33.3 11/12/2016 1128   MCHC 32.4 05/21/2016 1026   RDW 13.1 11/12/2016 1128   LYMPHSABS 1.6 11/12/2016 1128   MONOABS 336 05/21/2016 1026   EOSABS 0.3 11/12/2016 1128   BASOSABS 0.0 11/12/2016 1128   Iron/TIBC/Ferritin/ %Sat No results found for: IRON, TIBC, FERRITIN, IRONPCTSAT Lipid Panel     Component Value Date/Time   CHOL 205 (H) 11/30/2017 1153   TRIG 73 11/30/2017 1153  HDL 57 11/30/2017 1153   CHOLHDL 3.9 05/21/2016 1026   VLDL 13 05/21/2016 1026   LDLCALC 133 (H) 11/30/2017 1153   Hepatic Function Panel     Component Value Date/Time   PROT 6.9 11/30/2017 1153   ALBUMIN 4.4 11/30/2017 1153   AST 16 11/30/2017 1153   ALT 13 11/30/2017 1153   ALKPHOS 78 11/30/2017 1153   BILITOT 0.6 11/30/2017 1153      Component Value Date/Time   TSH 2.140 11/12/2016 1128   TSH 2.06 05/21/2016 1026   TSH 1.959 02/24/2011 1132  Results for Kotch, Tamula A (MRN [id number]) as of 02/03/2018 08:17  Ref. Range 11/30/2017 11:53  Vitamin D, 25-Hydroxy Latest Ref Range: 30.0 - 100.0 ng/mL 45.2    ASSESSMENT AND PLAN: Vitamin D deficiency - Plan: Vitamin D, Ergocalciferol, (DRISDOL) 1.25 MG (50000 UT) CAPS capsule  Prediabetes - Plan: metFORMIN (GLUCOPHAGE) 500 MG tablet  Other depression - with emotional eating - Plan: buPROPion (WELLBUTRIN SR) 200 MG 12 hr tablet  At risk for osteoporosis  Class 2 severe obesity with serious comorbidity and body mass index (BMI) of 35.0 to 35.9 in adult, unspecified obesity type (HCC)  PLAN:  Vitamin D Deficiency Lauren Burns was informed that low vitamin D levels contributes to fatigue and are associated with obesity, breast, and colon cancer. Lauren Burns agrees to continue taking prescription Vit D @50 ,000 IU every week #4 and we  will refill for 1 month. She will follow up for routine testing of vitamin D, at least 2-3 times per year. She was informed of the risk of over-replacement of vitamin D and agrees to not increase her dose unless she discusses this with [place] first. Brandye agrees to follow up with our clinic in 2 to 3 weeks.  At risk for osteopenia and osteoporosis Lauren Burns was given extended (15 minutes) osteoporosis prevention counseling today. Lauren Burns is at risk for osteopenia and osteoporsis due to her vitamin D deficiency. She was encouraged to take her vitamin D and follow her higher calcium diet and increase strengthening exercise to help strengthen her bones and decrease her risk of osteopenia and osteoporosis.  Insulin Resistance Lauren Burns will continue to work on weight loss, exercise, and decreasing simple carbohydrates in her diet to help decrease the risk of diabetes. We dicussed metformin including benefits and risks. She was informed that eating too many simple carbohydrates or too many calories at one sitting increases the likelihood of GI side effects. Lauren Burns agrees to continue taking metformin 500 mg BID #60 and we will refill for 1 month. Lauren Burns agrees to follow up with our clinic in 2 to 3 weeks as directed to monitor her progress.  Depression with Emotional Eating Behaviors We discussed behavior modification techniques today to help Lauren Burns deal with her emotional eating and depression. Lauren Burns agrees to continue taking bupropion SR 200 mg qd #30 and we will refill for 1 month. Lauren Burns agrees to follow up with our clinic in 2 to 3 weeks.  Obesity Lauren Burns is currently in the action stage of change. As such, her goal is to continue with weight loss efforts She has agreed to follow the Category 3 plan + 100 calories Lauren Burns has been instructed to work up to a goal of 150 minutes of combined cardio and strengthening exercise per week for weight loss and overall health benefits. We discussed the  following Behavioral Modification Strategies today: work on meal planning and easy cooking plans and ways to avoid boredom eating   Lauren Burns has agreed to  follow up with our clinic in 2 to 3 weeks. She was informed of the importance of frequent follow up visits to maximize her success with intensive lifestyle modifications for her multiple health conditions.   OBESITY BEHAVIORAL INTERVENTION VISIT  Today's visit was # 24   Starting weight: 320 lbs Starting date: 11/12/16 Today's weight : 231 lbs  Today's date: 02/02/2018 Total lbs lost to date: 66    ASK: We discussed the diagnosis of obesity with Lauren Burns today and Lauren Burns agreed to give [place] permission to discuss obesity behavioral modification therapy today.  ASSESS: Lauren Burns has the diagnosis of obesity and her BMI today is 35.13 Lauren Burns is in the action stage of change   ADVISE: Lauren Burns was educated on the multiple health risks of obesity as well as the benefit of weight loss to improve her health. She was advised of the need for long term treatment and the importance of lifestyle modifications.  AGREE: Multiple dietary modification options and treatment options were discussed and  Lauren Burns agreed to the above obesity treatment plan.  Trude Mcburney, am acting as transcriptionist for Alois Cliche, PA-C I, Alois Cliche, PA-C have reviewed above note and agree with its content

## 2018-02-14 ENCOUNTER — Other Ambulatory Visit: Payer: Self-pay | Admitting: Internal Medicine

## 2018-02-14 DIAGNOSIS — Z7901 Long term (current) use of anticoagulants: Secondary | ICD-10-CM

## 2018-02-17 ENCOUNTER — Other Ambulatory Visit: Payer: BLUE CROSS/BLUE SHIELD | Admitting: Internal Medicine

## 2018-02-17 ENCOUNTER — Ambulatory Visit: Payer: BLUE CROSS/BLUE SHIELD | Admitting: Internal Medicine

## 2018-02-17 DIAGNOSIS — Z7901 Long term (current) use of anticoagulants: Secondary | ICD-10-CM | POA: Diagnosis not present

## 2018-02-17 LAB — PROTIME-INR
INR: 2 — ABNORMAL HIGH
Prothrombin Time: 20 s — ABNORMAL HIGH (ref 9.0–11.5)

## 2018-02-22 ENCOUNTER — Encounter (INDEPENDENT_AMBULATORY_CARE_PROVIDER_SITE_OTHER): Payer: Self-pay

## 2018-02-22 ENCOUNTER — Ambulatory Visit (INDEPENDENT_AMBULATORY_CARE_PROVIDER_SITE_OTHER): Payer: BLUE CROSS/BLUE SHIELD | Admitting: Physician Assistant

## 2018-03-01 ENCOUNTER — Encounter: Payer: Self-pay | Admitting: Internal Medicine

## 2018-03-01 ENCOUNTER — Ambulatory Visit: Payer: BLUE CROSS/BLUE SHIELD | Admitting: Internal Medicine

## 2018-03-01 VITALS — Ht 68.0 in

## 2018-03-01 DIAGNOSIS — F3289 Other specified depressive episodes: Secondary | ICD-10-CM | POA: Diagnosis not present

## 2018-03-01 DIAGNOSIS — Z7901 Long term (current) use of anticoagulants: Secondary | ICD-10-CM

## 2018-03-01 MED ORDER — DULOXETINE HCL 30 MG PO CPEP: 30.0000 mg | ORAL_CAPSULE | Freq: Every day | ORAL | 1 refills | Status: DC

## 2018-03-01 MED ORDER — BUPROPION HCL ER (SR) 200 MG PO TB12: 200.0000 mg | ORAL_TABLET | Freq: Every day | ORAL | 2 refills | Status: DC

## 2018-03-01 NOTE — Progress Notes (Signed)
   Subjective:    Patient ID: Lauren Burns, female    DOB: 10/27/1975, 42 y.o.   MRN: [id number]  HPI 42 year old Female for follow up on chronic anticoagulation. Has antiphospholipid antibody syndrome Is on Coumadin 7 mg daily currently.  [facility]  She had pro time INR on November 21.  Result was 2.0.  She subsequently canceled her follow-up appointment that same day.  She is kept the appointment today regarding the November 21 appointment.  Pro time is still on the low side of normal.  We have agreed she will repeat pro time in 4 rather than 6 weeks. Continues on chronic antidepressant therapy.  Has issues with emotional eating.  Review of Systems no new complaints     Objective:   Physical Exam  Not examined spent 15 minutes speaking with her about pro time INR.  Continues to be seen at [facility].      Assessment & Plan:  Chronic anticoagulation  INR is low range of normal but therapeutic.  She will follow-up end of December with pro time without office visit.  Addendum: Patient canceled appointment for follow-up on December 30 and has rescheduled pro time INR for March 31, 2018

## 2018-03-02 ENCOUNTER — Ambulatory Visit: Payer: BLUE CROSS/BLUE SHIELD | Admitting: Obstetrics & Gynecology

## 2018-03-28 ENCOUNTER — Other Ambulatory Visit: Payer: BLUE CROSS/BLUE SHIELD | Admitting: Internal Medicine

## 2018-03-28 NOTE — Patient Instructions (Signed)
Continue same dose of Coumadin and follow-up end of December.

## 2018-03-31 ENCOUNTER — Other Ambulatory Visit: Payer: Self-pay

## 2018-03-31 ENCOUNTER — Other Ambulatory Visit: Payer: BLUE CROSS/BLUE SHIELD | Admitting: Internal Medicine

## 2018-03-31 DIAGNOSIS — Z7901 Long term (current) use of anticoagulants: Secondary | ICD-10-CM

## 2018-03-31 LAB — PROTIME-INR
INR: 1.7 — ABNORMAL HIGH
PROTHROMBIN TIME: 17 s — AB (ref 9.0–11.5)

## 2018-03-31 MED ORDER — WARFARIN SODIUM 6 MG PO TABS: ORAL_TABLET | ORAL | 0 refills | Status: DC

## 2018-03-31 MED ORDER — WARFARIN SODIUM 1 MG PO TABS: 1.0000 mg | ORAL_TABLET | Freq: Every day | ORAL | 0 refills | Status: DC

## 2018-04-12 ENCOUNTER — Encounter: Payer: Self-pay | Admitting: Internal Medicine

## 2018-04-12 ENCOUNTER — Other Ambulatory Visit: Payer: BLUE CROSS/BLUE SHIELD | Admitting: Internal Medicine

## 2018-04-12 ENCOUNTER — Ambulatory Visit: Payer: BLUE CROSS/BLUE SHIELD | Admitting: Internal Medicine

## 2018-04-12 VITALS — Ht 68.0 in

## 2018-04-12 DIAGNOSIS — D6861 Antiphospholipid syndrome: Secondary | ICD-10-CM | POA: Diagnosis not present

## 2018-04-12 DIAGNOSIS — Z7901 Long term (current) use of anticoagulants: Secondary | ICD-10-CM

## 2018-04-12 LAB — PROTIME-INR
INR: 2.3 — ABNORMAL HIGH
Prothrombin Time: 22.5 s — ABNORMAL HIGH (ref 9.0–11.5)

## 2018-04-12 NOTE — Patient Instructions (Signed)
Continue 7 mg of Coumadin daily and follow-up in 6 weeks.  See GYN physician regarding lower abdominal pain and endometriosis type symptoms.

## 2018-04-12 NOTE — Progress Notes (Signed)
   Subjective:    Patient ID: Lauren Burns, female    DOB: 05-29-1975, 43 y.o.   MRN: [id number]  HPI 43 year old Female in today with history of chronic anticoagulation for antiphospholipid antibody syndrome.  Pro time INR is excellent at 2.3.  Currently on Coumadin 7 mg daily.  Has been having issues with lower abdominal pain and low back pain extending into her legs.  This is been going on for some time.  She thought it was a recurrence of endometriosis.  She saw GYN physician who told her she was menopausal.  She plans to see another GYN physician in [facility] in the near future.  We will defer further evaluation of this issue until she sees GYN at [facility].  Sometimes feels nauseated with the abdominal pain.  No UTI symptoms.    Review of Systems see above     Objective:   Physical Exam Straight leg raising is negative at 90 degrees bilaterally.       Assessment & Plan:  Chronic anticoagulation stable on 7 mg Coumadin.  Return in 6 weeks.  Abdominal pain, back pain and leg pain-to see GYN in the near future after which she can be seen here for evaluation if necessary.

## 2018-04-28 ENCOUNTER — Encounter: Payer: Self-pay | Admitting: Family Medicine

## 2018-04-28 ENCOUNTER — Ambulatory Visit: Payer: BLUE CROSS/BLUE SHIELD | Admitting: Family Medicine

## 2018-04-28 VITALS — BP 134/84 | HR 69 | Ht 68.0 in | Wt 249.0 lb

## 2018-04-28 DIAGNOSIS — M5441 Lumbago with sciatica, right side: Secondary | ICD-10-CM

## 2018-04-28 DIAGNOSIS — G8929 Other chronic pain: Secondary | ICD-10-CM

## 2018-04-28 DIAGNOSIS — D6861 Antiphospholipid syndrome: Secondary | ICD-10-CM

## 2018-04-28 DIAGNOSIS — Z7689 Persons encountering health services in other specified circumstances: Secondary | ICD-10-CM | POA: Diagnosis not present

## 2018-04-28 DIAGNOSIS — M5442 Lumbago with sciatica, left side: Secondary | ICD-10-CM | POA: Diagnosis not present

## 2018-04-28 DIAGNOSIS — Z7901 Long term (current) use of anticoagulants: Secondary | ICD-10-CM

## 2018-04-28 MED ORDER — WARFARIN SODIUM 1 MG PO TABS: 1.0000 mg | ORAL_TABLET | Freq: Every day | ORAL | 3 refills | Status: DC

## 2018-04-28 MED ORDER — WARFARIN SODIUM 6 MG PO TABS: ORAL_TABLET | ORAL | 3 refills | Status: DC

## 2018-04-28 NOTE — Patient Instructions (Signed)
[employer] [phone] [facility] [phone]  No referral needed

## 2018-04-28 NOTE — Progress Notes (Signed)
Lauren Burns is a 43 y.o. female  Chief Complaint  Patient presents with  . Establish Care    pt here to establish care , pt would like to talk about getting a good coumadin regimen and INR checks and also would like a referral for a chiropractor for low back pain she has been having for months     HPI: Lauren Burns is a 43 y.o. female here to establish care with our office. She is requesting a referral to a chiropractor to address her LBP x months. She has been doing heat/ice, stretching exercises with some relief. Symptoms worse at end of day. B/L LBP that radiates to hips and thighs B/L.  She also wants to discuss her coumadin regimen. She has been on coumadin x 15 years since diagnosis of antiphospholipid syndrome. She is currently taking 7mg  daily. INR last checked about 2 weeks ago and in range. She does need a refill of her coumadin, both 1mg  and 6mg  tabs.   Specialists: none  Last CPE, labs: UTD 11/2017  Last PAP: 12/2017 Last mammo: 06/2017 Last Dexa: n/a Last colonoscopy: n/a  Med refills needed today: coumadin 1mg  and 6mg    Past Medical History:  Diagnosis Date  . Antiphospholipid antibody syndrome (HCC)   . Anxiety   . Arthritis   . Depression   . Endometriosis   . Endometriosis   . Fatigue   . Fibromyalgia   . GERD (gastroesophageal reflux disease)   . Heart murmur   . Hypertension   . IBS (irritable bowel syndrome)   . IBS (irritable bowel syndrome)   . Obesity   . Pulmonary embolism (HCC) 2004  . Shortness of breath   . Sleep apnea   . Vitamin D deficiency     History reviewed. No pertinent surgical history.  Social History   Socioeconomic History  . Marital status: Married    Spouse name: Not on file  . Number of children: Not on file  . Years of education: Not on file  . Highest education level: Not on file  Occupational History  . Occupation: Store Copywriter, advertisingwner/Manager  Social Needs  . Financial resource strain: Not on file  . Food  insecurity:    Worry: Not on file    Inability: Not on file  . Transportation needs:    Medical: Not on file    Non-medical: Not on file  Tobacco Use  . Smoking status: Former Smoker    Last attempt to quit: 12/17/2008    Years since quitting: 9.3  . Smokeless tobacco: Never Used  Substance and Sexual Activity  . Alcohol use: No  . Drug use: No  . Sexual activity: Not on file  Lifestyle  . Physical activity:    Days per week: Not on file    Minutes per session: Not on file  . Stress: Not on file  Relationships  . Social connections:    Talks on phone: Not on file    Gets together: Not on file    Attends religious service: Not on file    Active member of club or organization: Not on file    Attends meetings of clubs or organizations: Not on file    Relationship status: Not on file  . Intimate partner violence:    Fear of current or ex partner: Not on file    Emotionally abused: Not on file    Physically abused: Not on file    Forced sexual activity: Not on  file  Other Topics Concern  . Not on file  Social History Narrative  . Not on file    Family History  Problem Relation Age of Onset  . Hyperlipidemia Mother   . Depression Mother   . Anxiety disorder Mother   . Obesity Mother   . Heart disease Father   . Alcohol abuse Father   . Hypertension Father   . Cancer Father   . Alcoholism Father   . Obesity Father      Immunization History  Administered Date(s) Administered  . Influenza,inj,Quad PF,6+ Mos 12/25/2014, 12/17/2016, 12/16/2017  . Tdap 09/27/2009    Outpatient Encounter Medications as of 04/28/2018  Medication Sig  . buPROPion (WELLBUTRIN SR) 200 MG 12 hr tablet Take 1 tablet (200 mg total) by mouth daily at 12 noon.  Marland Kitchen losartan (COZAAR) 50 MG tablet Take 1 tablet (50 mg total) daily by mouth. (Patient taking differently: Take 25 mg by mouth daily. )  . Vitamin D, Ergocalciferol, (DRISDOL) 1.25 MG (50000 UT) CAPS capsule Take 1 capsule (50,000 Units  total) by mouth every 7 (seven) days.  Marland Kitchen warfarin (COUMADIN) 1 MG tablet Take 1 tablet (1 mg total) by mouth daily.  Marland Kitchen warfarin (COUMADIN) 6 MG tablet TAKE 1 TABLET(6 MG) BY MOUTH DAILY  . [DISCONTINUED] warfarin (COUMADIN) 1 MG tablet Take 1 tablet (1 mg total) by mouth daily.  . [DISCONTINUED] warfarin (COUMADIN) 6 MG tablet TAKE 1 TABLET(6 MG) BY MOUTH DAILY   No facility-administered encounter medications on file as of 04/28/2018.      ROS: Gen: no fever, chills  Skin: no rash, itching ENT: no ear pain, ear drainage, nasal congestion, rhinorrhea, sinus pressure, sore throat Eyes: no blurry vision, double vision Resp: no cough, wheeze,SOB CV: no CP, palpitations, LE edema,  GI: no heartburn, n/v/d/c, abd pain GU: no dysuria, urgency, frequency, hematuria  MSK: no joint pain, myalgias, back pain Neuro: no dizziness, headache, weakness, vertigo Psych: no depression, anxiety, insomnia   No Known Allergies  BP 134/84   Pulse 69   Ht 5\' 8"  (1.727 m)   Wt 249 lb (112.9 kg)   LMP 12/21/2016   SpO2 95%   BMI 37.86 kg/m   BP Readings from Last 3 Encounters:  04/28/18 134/84  02/02/18 128/82  01/18/18 120/74     Physical Exam  Constitutional: She appears well-developed and well-nourished. No distress.  Cardiovascular: Normal rate, regular rhythm and normal heart sounds.  No murmur heard. Pulmonary/Chest: Effort normal and breath sounds normal. No respiratory distress.  Musculoskeletal:        General: No edema.  Skin: Skin is warm and dry.  Psychiatric: She has a normal mood and affect. Her behavior is normal.     A/P:  1. Chronic anticoagulation 2. Antiphospholipid antibody syndrome (HCC) - last INR on 04/12/18 = 2.3 (goal 2.0-3.0) - cont coumadin 7mg  daily Refill: - warfarin (COUMADIN) 6 MG tablet; TAKE 1 TABLET(6 MG) BY MOUTH DAILY  Dispense: 90 tablet; Refill: 3 - warfarin (COUMADIN) 1 MG tablet; Take 1 tablet (1 mg total) by mouth daily.  Dispense: 90 tablet;  Refill: 3 - RN appt in 4 wks for INR check  3. Chronic bilateral low back pain with bilateral sciatica - pts insurance does not need referral for chiropractor and I advised pt to contact her insurance to see if chiropractor visits are covered - included info in AVS of 2 local chiropractors - also discussed PT as an option but pt prefers to see  chiropractor first  4. Encounter to establish care with new doctor - UTD on CPE, labs, PAP, mammo - due in fall 2020

## 2018-05-20 ENCOUNTER — Other Ambulatory Visit: Payer: Self-pay | Admitting: Internal Medicine

## 2018-05-26 ENCOUNTER — Ambulatory Visit (INDEPENDENT_AMBULATORY_CARE_PROVIDER_SITE_OTHER): Payer: BLUE CROSS/BLUE SHIELD | Admitting: Family Medicine

## 2018-05-26 DIAGNOSIS — Z7901 Long term (current) use of anticoagulants: Secondary | ICD-10-CM | POA: Diagnosis not present

## 2018-05-26 LAB — POCT INR: INR: 3.8 — AB (ref 2.0–3.0)

## 2018-05-26 NOTE — Patient Instructions (Addendum)
Per Dr. Barron Alvine: Hold Coumadin dose for next 4 days & then resume taking 6 mg daily. Return in 2 weeks for INR check.

## 2018-05-26 NOTE — Progress Notes (Signed)
Patient presents in office today for INR check. She verbalized compliance with current medication regimen. Patient reported no changes with medications or diet. All other findings were negative. INR reading during visit was 3.8.  Per Dr. Barron Alvine: Hold Coumadin dose for next 4 days & then resume taking 6 mg daily. Return in 2 weeks for INR check.   Informed patient of the provider's recommendations. She voiced understanding and did not have any further questions or concerns prior to leaving the nurse visit. Next appointment has been scheduled for 06/09/2018 at 8:20 AM.

## 2018-05-26 NOTE — Progress Notes (Signed)
Chart reviewed and recommendations as documented below

## 2018-06-09 ENCOUNTER — Ambulatory Visit (INDEPENDENT_AMBULATORY_CARE_PROVIDER_SITE_OTHER): Payer: BLUE CROSS/BLUE SHIELD | Admitting: Behavioral Health

## 2018-06-09 ENCOUNTER — Other Ambulatory Visit: Payer: Self-pay

## 2018-06-09 DIAGNOSIS — Z7901 Long term (current) use of anticoagulants: Secondary | ICD-10-CM | POA: Diagnosis not present

## 2018-06-09 LAB — POCT INR: INR: 2.4 (ref 2.0–3.0)

## 2018-06-09 NOTE — Patient Instructions (Signed)
Per Dr. Barron Alvine: Continue taking Coumadin 6 mg daily. Return in 1 month for INR check.

## 2018-06-09 NOTE — Progress Notes (Signed)
Patient presents in clinic for INR recheck. She voiced compliance with current medication regimen. Patient reported no changes with diet or other medications; all findings were negative. Today's INR reading was 2.4.  Per Dr. Barron Alvine: Continue taking Coumadin 6 mg daily. Return in 1 month for INR check.   Informed patient of the provider's recommendations. She verbalized understanding and did not have any further questions or concerns before leaving the nurse visit.

## 2018-06-20 ENCOUNTER — Other Ambulatory Visit: Payer: Self-pay | Admitting: Internal Medicine

## 2018-06-20 DIAGNOSIS — F3289 Other specified depressive episodes: Secondary | ICD-10-CM

## 2018-07-05 ENCOUNTER — Ambulatory Visit (INDEPENDENT_AMBULATORY_CARE_PROVIDER_SITE_OTHER): Payer: BLUE CROSS/BLUE SHIELD | Admitting: Family Medicine

## 2018-07-05 ENCOUNTER — Other Ambulatory Visit: Payer: Self-pay

## 2018-07-05 ENCOUNTER — Encounter: Payer: Self-pay | Admitting: Family Medicine

## 2018-07-05 DIAGNOSIS — J302 Other seasonal allergic rhinitis: Secondary | ICD-10-CM | POA: Diagnosis not present

## 2018-07-05 DIAGNOSIS — J069 Acute upper respiratory infection, unspecified: Secondary | ICD-10-CM | POA: Diagnosis not present

## 2018-07-05 NOTE — Progress Notes (Signed)
Virtual Visit via Video Note  I connected with Lauren Burns on 07/05/18 at 11:30 AM EDT by a video enabled telemedicine application and verified that I am speaking with the correct person using two identifiers. Location patient: home Location provider: home office Persons participating in the virtual visit: patient, provider  I discussed the limitations of evaluation and management by telemedicine and the availability of in person appointments. The patient expressed understanding and agreed to proceed.  Chief Complaint  Patient presents with  . Cough    headache,dry cough, body aches, pain and pressure in face, 99.5 fever last night, body aches/ 4 days/started and went away/OTC allergy, musinex, tylenol/ NO fever today     HPI: Lauren Burns is a 43 y.o. female complains of 4 day h/o dry cough, facial pressure, headache, PND, nasal congestion. Pt reports body aches and low grade temp of 99.4 last night. No chills, sore throat, ear pain, SOB. Pt endorses episodic chest tightness, which she has attributed to anxiety/stress. 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 She also endorses fatigue.  Pt has been taking tylenol, mucinex and allergy medication. She has a h/o seasonal allergies and states symptoms are similar to her typical allergy symptoms. However, she is also worried about coronavirus especially since she works and interacts with the public.  No travel outside of [place].  Past Medical History:  Diagnosis Date  . Antiphospholipid antibody syndrome (HCC)   . Anxiety   . Arthritis   . Depression   . Endometriosis   . Endometriosis   . Fatigue   . Fibromyalgia   . GERD (gastroesophageal reflux disease)   . Heart murmur   . Hypertension   . IBS (irritable bowel syndrome)   . IBS (irritable bowel syndrome)   . Obesity   . Pulmonary embolism (HCC) 2004  . Shortness of breath   .  Sleep apnea   . Vitamin D deficiency     No past surgical history on file.  Family History  Problem Relation Age of Onset  . Hyperlipidemia Mother   . Depression Mother   . Anxiety disorder Mother   . Obesity Mother   . Heart disease Father   . Alcohol abuse Father   . Hypertension Father   . Cancer Father   . Alcoholism Father   . Obesity Father     Social History   Tobacco Use  . Smoking status: Former Smoker    Last attempt to quit: 12/17/2008    Years since quitting: 9.5  . Smokeless tobacco: Never Used  Substance Use Topics  . Alcohol use: No  . Drug use: No     Current Outpatient Medications:  .  buPROPion (WELLBUTRIN SR) 200 MG 12 hr tablet, TAKE 1 TABLET BY MOUTH EVERY DAY AT 12 NOON, Disp: 30 tablet, Rfl: 2 .  warfarin (COUMADIN) 6 MG tablet, TAKE 1 TABLET(6 MG) BY MOUTH DAILY, Disp: 90 tablet, Rfl: 3 .  DULoxetine (CYMBALTA) 30 MG capsule, TAKE 1 CAPSULE(30 MG) BY MOUTH DAILY (Patient not taking: Reported on 07/05/2018), Disp: 90 capsule, Rfl: 3 .  Vitamin D, Ergocalciferol, (DRISDOL) 1.25 MG (50000 UT) CAPS capsule, Take 1 capsule (50,000 Units total) by mouth every 7 (seven) days. (Patient not taking: Reported on 07/05/2018), Disp: 4 capsule, Rfl: 0 .  warfarin (COUMADIN) 1 MG tablet, Take 1 tablet (1 mg total) by mouth daily. (Patient not taking: Reported  on 06/09/2018), Disp: 90 tablet, Rfl: 3  No Known Allergies    ROS: See pertinent positives and negatives per HPI.   EXAM:  VITALS per patient if applicable:  GENERAL: alert, oriented, appears well and in no acute distress  HEENT: atraumatic, conjunctiva clear, no obvious abnormalities on inspection of external nose and ears  NECK: normal movements of the head and neck  LUNGS: on inspection no signs of respiratory distress, breathing rate appears normal, no obvious gross SOB, gasping or wheezing, no conversational dyspnea  CV: no obvious cyanosis  PSYCH/NEURO: pleasant and cooperative, no obvious  depression or anxiety, speech and thought processing grossly intact   ASSESSMENT AND PLAN: 1. Seasonal allergies 2. Viral URI - could be seasonal allergies, viral etiology (and cannot exclude coronavirus) or combination of both - advised pt to continue with supportive care to include increased fluid intake, rest, tylenol PRN - cont generic claritin daily, add flonase daily and nasal saline spray 3x/day - pt will cont to check temp and plans to stay home until fever free x 48 hrs and will likely self-quarantine x 14 days in case she does have covid-19 - advised pt to f/u if symptoms worsen or do not improve in 7-10 days   I discussed the assessment and treatment plan with the patient. The patient was provided an opportunity to ask questions and all were answered. The patient agreed with the plan and demonstrated an understanding of the instructions.   The patient was advised to call back or seek an in-person evaluation if the symptoms worsen or if the condition fails to improve as anticipated.   Luana Shu, DO

## 2018-07-21 ENCOUNTER — Ambulatory Visit: Payer: Self-pay

## 2018-07-21 ENCOUNTER — Other Ambulatory Visit: Payer: Self-pay

## 2018-07-21 ENCOUNTER — Encounter: Payer: Self-pay | Admitting: Family Medicine

## 2018-07-21 ENCOUNTER — Ambulatory Visit (INDEPENDENT_AMBULATORY_CARE_PROVIDER_SITE_OTHER): Payer: BLUE CROSS/BLUE SHIELD | Admitting: Family Medicine

## 2018-07-21 VITALS — BP 130/100 | HR 72 | Temp 97.8°F | Ht 68.0 in | Wt 270.2 lb

## 2018-07-21 DIAGNOSIS — N3001 Acute cystitis with hematuria: Secondary | ICD-10-CM

## 2018-07-21 DIAGNOSIS — R109 Unspecified abdominal pain: Secondary | ICD-10-CM | POA: Diagnosis not present

## 2018-07-21 DIAGNOSIS — Z86718 Personal history of other venous thrombosis and embolism: Secondary | ICD-10-CM

## 2018-07-21 LAB — POCT URINALYSIS DIPSTICK
Glucose, UA: NEGATIVE
Ketones, UA: NEGATIVE
Nitrite, UA: NEGATIVE
Protein, UA: POSITIVE — AB
Spec Grav, UA: >=1.03 — AB (ref 1.010–1.025)
Urobilinogen, UA: 1 E.U./dL
pH, UA: 5.5 (ref 5.0–8.0)

## 2018-07-21 LAB — PROTIME-INR
INR: 4.1 ratio — ABNORMAL HIGH (ref 0.8–1.0)
Prothrombin Time: 46.5 s — ABNORMAL HIGH (ref 9.6–13.1)

## 2018-07-21 MED ORDER — NITROFURANTOIN MONOHYD MACRO 100 MG PO CAPS: 100.0000 mg | ORAL_CAPSULE | Freq: Two times a day (BID) | ORAL | 0 refills | Status: AC

## 2018-07-21 NOTE — Progress Notes (Signed)
Lauren Burns is a 43 y.o. female  Chief Complaint  Patient presents with  . Flank Pain    blood in urine (reddish brown) visible each time urinate/ lower abdominal crampin/ lower back pain both sides    HPI: Lauren Burns is a 43 y.o. female complains of gross hematuria, lower abdominal cramping and B/L low back pain, mild urinary urgency.  + mild intermittent nausea, no vomiting. No diarrhea or constipation. No dysuria or frequency. No vaginal bleeding.  + body aches - the past 2 nights. No fever, chills.   Pt is on coumadin 6mg  daily and is overdue for INR check.   Past Medical History:  Diagnosis Date  . Antiphospholipid antibody syndrome (HCC)   . Anxiety   . Arthritis   . Depression   . Endometriosis   . Endometriosis   . Fatigue   . Fibromyalgia   . GERD (gastroesophageal reflux disease)   . Heart murmur   . Hypertension   . IBS (irritable bowel syndrome)   . IBS (irritable bowel syndrome)   . Obesity   . Pulmonary embolism (HCC) 2004  . Shortness of breath   . Sleep apnea   . Vitamin D deficiency     No past surgical history on file.  Social History   Socioeconomic History  . Marital status: Married    Spouse name: Not on file  . Number of children: Not on file  . Years of education: Not on file  . Highest education level: Not on file  Occupational History  . Occupation: Store Copywriter, advertising  . Financial resource strain: Not on file  . Food insecurity:    Worry: Not on file    Inability: Not on file  . Transportation needs:    Medical: Not on file    Non-medical: Not on file  Tobacco Use  . Smoking status: Former Smoker    Last attempt to quit: 12/17/2008    Years since quitting: 9.5  . Smokeless tobacco: Never Used  Substance and Sexual Activity  . Alcohol use: No  . Drug use: No  . Sexual activity: Not on file  Lifestyle  . Physical activity:    Days per week: Not on file    Minutes per session: Not on file  .  Stress: Not on file  Relationships  . Social connections:    Talks on phone: Not on file    Gets together: Not on file    Attends religious service: Not on file    Active member of club or organization: Not on file    Attends meetings of clubs or organizations: Not on file    Relationship status: Not on file  . Intimate partner violence:    Fear of current or ex partner: Not on file    Emotionally abused: Not on file    Physically abused: Not on file    Forced sexual activity: Not on file  Other Topics Concern  . Not on file  Social History Narrative  . Not on file    Family History  Problem Relation Age of Onset  . Hyperlipidemia Mother   . Depression Mother   . Anxiety disorder Mother   . Obesity Mother   . Heart disease Father   . Alcohol abuse Father   . Hypertension Father   . Cancer Father   . Alcoholism Father   . Obesity Father      Immunization History  Administered Date(s) Administered  .  Influenza,inj,Quad PF,6+ Mos 12/25/2014, 12/17/2016, 12/16/2017  . Tdap 09/27/2009    Outpatient Encounter Medications as of 07/21/2018  Medication Sig  . buPROPion (WELLBUTRIN SR) 200 MG 12 hr tablet TAKE 1 TABLET BY MOUTH EVERY DAY AT 12 NOON  . warfarin (COUMADIN) 6 MG tablet TAKE 1 TABLET(6 MG) BY MOUTH DAILY  . nitrofurantoin, macrocrystal-monohydrate, (MACROBID) 100 MG capsule Take 1 capsule (100 mg total) by mouth 2 (two) times daily for 7 days.  . [DISCONTINUED] DULoxetine (CYMBALTA) 30 MG capsule TAKE 1 CAPSULE(30 MG) BY MOUTH DAILY (Patient not taking: Reported on 07/05/2018)  . [DISCONTINUED] Vitamin D, Ergocalciferol, (DRISDOL) 1.25 MG (50000 UT) CAPS capsule Take 1 capsule (50,000 Units total) by mouth every 7 (seven) days. (Patient not taking: Reported on 07/05/2018)  . [DISCONTINUED] warfarin (COUMADIN) 1 MG tablet Take 1 tablet (1 mg total) by mouth daily. (Patient not taking: Reported on 06/09/2018)   No facility-administered encounter medications on file as of  07/21/2018.      ROS: Pertinent positives and negatives noted in HPI. Remainder of ROS non-contributory    No Known Allergies  BP (!) 130/100   Pulse 72   Temp 97.8 F (36.6 C) (Oral)   Ht 5\' 8"  (1.727 m)   Wt 270 lb 3.2 oz (122.6 kg)   LMP 12/21/2016   SpO2 99%   BMI 41.08 kg/m   Physical Exam  Constitutional: She is oriented to person, place, and time. She appears well-developed and well-nourished. No distress.  Abdominal: Soft. Bowel sounds are normal. She exhibits no distension. There is abdominal tenderness (suprapubic TTP). There is no rebound, no guarding and no CVA tenderness.  Neurological: She is alert and oriented to person, place, and time.  Skin: Skin is warm and dry.    A/P:  1. Acute cystitis with hematuria - POCT Urinalysis Dipstick - Urine Culture - increase water intake Rx: - nitrofurantoin, macrocrystal-monohydrate, (MACROBID) 100 MG capsule; Take 1 capsule (100 mg total) by mouth 2 (two) times daily for 7 days.  Dispense: 14 capsule; Refill: 0 - f/u if symptoms worsen or do not improve in 7-10 days  2. PULMONARY EMBOLISM, HX OF - POCT machine is not functioning so pt will get INR via lab draw - Protime-INR - will contact pt once result available and adjust coumadin as needed

## 2018-07-21 NOTE — Telephone Encounter (Signed)
Pt. called to report having blood in urine since Tuesday, 4/21.  Described as "reddish-brown" color in toilet bowl.  Stated the blood has been visible with each time she urinates, since Tues.  Denied seeing blood in stool.  Noted trace amt. blood on toilet tissue with wiping.  C/o lower abdominal cramping, lower back pain, and bilateral flank pain.  Denied fever. C/o body aches.  C/o nausea; no vomiting.  Pt. stated she is post menopausal; has not had menstrual cycle in 1.5 yrs.  Is on Warfarin.   Called FC; transferred pt. To office to be scheduled for an appt.  Pt. Agreed with plan.      Reason for Disposition . Blood in urine  (Exception: could be normal menstrual bleeding)  Answer Assessment - Initial Assessment Questions 1. SYMPTOM: "What's the main symptom you're concerned about?" (e.g., frequency, incontinence)     Seeing blood in toilet bowl with voiding ; described as "reddish- brownish" 2. ONSET: "When did the  symptoms start?"     Tuesday, 4/14 3. PAIN: "Is there any pain?" If so, ask: "How bad is it?" (Scale: 1-10; mild, moderate, severe)     Cramping in lower abdomen and lower back 4. CAUSE: "What do you think is causing the symptoms?"     No  5. OTHER SYMPTOMS: "Do you have any other symptoms?" (e.g., fever, flank pain, blood in urine, pain with urination)     Body aches, no fever, no pain or urgency with urination , some flank pain ; c/o intermittent nausea,  reported hx of endometriosis  6. PREGNANCY: "Is there any chance you are pregnant?" "When was your last menstrual period?"     Post menopause; hasn't had cycle in 1.5 yrs.  Answer Assessment - Initial Assessment Questions 1. COLOR of URINE: "Describe the color of the urine."  (e.g., tea-colored, pink, red, blood clots, bloody)     Reddish- brownish  2. ONSET: "When did the bleeding start?"      Tues., 4/21 3. EPISODES: "How many times has there been blood in the urine?" or "How many times today?"     Every time she  urinates  4. PAIN with URINATION: "Is there any pain with passing your urine?" If so, ask: "How bad is the pain?"  (Scale 1-10; or mild, moderate, severe)    - MILD - complains slightly about urination hurting    - MODERATE - interferes with normal activities      - SEVERE - excruciating, unwilling or unable to urinate because of the pain      Lower abdominal cramping mild  5. FEVER: "Do you have a fever?" If so, ask: "What is your temperature, how was it measured, and when did it start?"     No 6. ASSOCIATED SYMPTOMS: "Are you passing urine more frequently than usual?"     Urination is in normal amts and not frequent 7. OTHER SYMPTOMS: "Do you have any other symptoms?" (e.g., back/flank pain, abdominal pain, vomiting)     Low back and flank pain, nausea, lower abdominal pain 8. PREGNANCY: "Is there any chance you are pregnant?" "When was your last menstrual period?"    Post menopause  Protocols used: URINE - BLOOD IN-A-AH, URINARY [facility]

## 2018-07-22 ENCOUNTER — Other Ambulatory Visit: Payer: Self-pay

## 2018-07-22 LAB — URINE CULTURE
MICRO NUMBER:: 417106
SPECIMEN QUALITY:: ADEQUATE

## 2018-07-22 MED ORDER — WARFARIN SODIUM 5 MG PO TABS: 5.0000 mg | ORAL_TABLET | Freq: Every day | ORAL | 1 refills | Status: DC

## 2018-07-27 ENCOUNTER — Encounter: Payer: Self-pay | Admitting: Family Medicine

## 2018-08-05 ENCOUNTER — Telehealth: Payer: Self-pay

## 2018-08-05 NOTE — Telephone Encounter (Signed)
Called and left pt VM, need to set pt up for a nurse visit to do INR repeat.

## 2018-08-09 ENCOUNTER — Ambulatory Visit (INDEPENDENT_AMBULATORY_CARE_PROVIDER_SITE_OTHER): Payer: BLUE CROSS/BLUE SHIELD | Admitting: Behavioral Health

## 2018-08-09 DIAGNOSIS — Z7901 Long term (current) use of anticoagulants: Secondary | ICD-10-CM

## 2018-08-09 LAB — POCT INR: INR: 2.4 (ref 2.0–3.0)

## 2018-08-09 NOTE — Patient Instructions (Signed)
Per Dr. Doreene Burke: Continue taking Coumadin 5 mg daily. Return in 1 month for INR check.

## 2018-08-09 NOTE — Progress Notes (Signed)
Patient presents in office for INR recheck. She voices adherence to current medication regimen. Patient reported no changes with diet or other medications. All findings were negative. INR reading during today's visit was 2.4.  Per Dr. Doreene Burke: Continue taking Coumadin 5 mg daily. Return in 1 month for INR check.   Informed patient of the provider's recommendation. She verbalized understanding & did not have any further questions or concerns.  Next appointment has been scheduled for 09/08/2018 at 8:20 AM.

## 2018-09-07 ENCOUNTER — Telehealth: Payer: Self-pay | Admitting: Behavioral Health

## 2018-09-07 NOTE — Telephone Encounter (Signed)

## 2018-09-08 ENCOUNTER — Ambulatory Visit: Payer: BC Managed Care – PPO

## 2019-01-02 DIAGNOSIS — Z1231 Encounter for screening mammogram for malignant neoplasm of breast: Secondary | ICD-10-CM | POA: Diagnosis not present

## 2019-01-02 LAB — HM MAMMOGRAPHY

## 2019-01-03 ENCOUNTER — Encounter: Payer: Self-pay | Admitting: Family Medicine

## 2019-03-25 ENCOUNTER — Other Ambulatory Visit: Payer: Self-pay | Admitting: Family Medicine

## 2019-03-25 DIAGNOSIS — Z86718 Personal history of other venous thrombosis and embolism: Secondary | ICD-10-CM

## 2019-03-27 NOTE — Telephone Encounter (Signed)
Dr. Loletha Grayer please advise, last INR check was 07/2018--no future appt set with you or with Coumadin.

## 2019-03-28 ENCOUNTER — Other Ambulatory Visit: Payer: Self-pay

## 2019-03-28 NOTE — Telephone Encounter (Signed)
Pt needs lab appt for INR

## 2019-03-28 NOTE — Telephone Encounter (Signed)
Pt has nurse visit tomorrow for INR check at 8:20. Please help cancel the lab order that Dr. Loletha Grayer put in.

## 2019-03-29 ENCOUNTER — Other Ambulatory Visit: Payer: Self-pay | Admitting: Family Medicine

## 2019-03-29 ENCOUNTER — Other Ambulatory Visit: Payer: Self-pay

## 2019-03-29 ENCOUNTER — Ambulatory Visit (INDEPENDENT_AMBULATORY_CARE_PROVIDER_SITE_OTHER): Payer: BC Managed Care – PPO | Admitting: Behavioral Health

## 2019-03-29 DIAGNOSIS — Z7901 Long term (current) use of anticoagulants: Secondary | ICD-10-CM

## 2019-03-29 DIAGNOSIS — D6861 Antiphospholipid syndrome: Secondary | ICD-10-CM

## 2019-03-29 LAB — POCT INR: INR: 1.9 — AB (ref 2.0–3.0)

## 2019-03-29 MED ORDER — WARFARIN SODIUM 6 MG PO TABS: ORAL_TABLET | ORAL | 3 refills | Status: DC

## 2019-03-29 NOTE — Patient Instructions (Signed)
Per Dr. Bryan Lemma: Take Coumadin 6 mg daily. Return in 2 weeks for INR check.

## 2019-03-29 NOTE — Progress Notes (Signed)
Agree with documentation and treatment plan as noted below

## 2019-03-29 NOTE — Progress Notes (Signed)
Patient presents in office for INR check. She voiced compliance with previous medication regimen, except on 12/28 & 12/29, she took Coumadin 6 mg. Per patient, she is currently out of her Coumadin 5 mg tablets. All patient findings were negative. Today's INR reading was 1.9.  Per Dr. Bryan Lemma: Take Coumadin 6 mg daily. Return in 2 weeks for INR check. New Rx has been sent to pharmacy.  Informed patient of the provider's recommendations. She verbalized understanding and did not have any further questions or concerns.  Next appointment has been scheduled for 04/12/2019 at 9:00 AM.

## 2019-03-29 NOTE — Progress Notes (Signed)
Today, INR 1.9 on 5mg  daily. Pt did take 6mg  daily for the past 2 days.  Increase coumadin dose to 6mg  daily, repeat INR in 2 wks

## 2019-04-11 ENCOUNTER — Other Ambulatory Visit: Payer: Self-pay

## 2019-04-12 ENCOUNTER — Ambulatory Visit (INDEPENDENT_AMBULATORY_CARE_PROVIDER_SITE_OTHER): Payer: BC Managed Care – PPO | Admitting: Behavioral Health

## 2019-04-12 DIAGNOSIS — Z7901 Long term (current) use of anticoagulants: Secondary | ICD-10-CM | POA: Diagnosis not present

## 2019-04-12 LAB — POCT INR: INR: 3.7 — AB (ref 2.0–3.0)

## 2019-04-12 MED ORDER — WARFARIN SODIUM 5 MG PO TABS: 5.0000 mg | ORAL_TABLET | Freq: Every day | ORAL | 3 refills | Status: DC

## 2019-04-12 NOTE — Addendum Note (Signed)
Addended by: Overton Mam on: 04/12/2019 10:31 AM   Modules accepted: Orders

## 2019-04-12 NOTE — Progress Notes (Signed)
Patient presents in office for INR check. She voiced adherence to current medication regimen. All patient findings were negative; she reported no changes in medications or diet. Today's INR reading was 3.7.  Per Dr. Barron Alvine: Hold Coumadin dose through Sunday, 04/16/2019. On Monday, 04/17/19 begin taking Coumadin 5 MG daily. Return in 3 weeks for INR check.   Informed patient of the provider's recommendations. She verbalized understanding and did not have any further questions or concerns.  Next appointment has been scheduled for 05/03/2019 at 9 AM.

## 2019-04-12 NOTE — Patient Instructions (Signed)
Per Dr. Barron Alvine: Hold Coumadin dose through Sunday, 04/16/2019. On Monday, 04/17/19 begin taking Coumadin 5 MG daily. Return in 3 weeks for INR check.

## 2019-04-12 NOTE — Progress Notes (Signed)
Agree with documentation, treatment plan, and f/u as written below

## 2019-05-03 ENCOUNTER — Other Ambulatory Visit: Payer: Self-pay

## 2019-05-03 ENCOUNTER — Ambulatory Visit (INDEPENDENT_AMBULATORY_CARE_PROVIDER_SITE_OTHER): Payer: BC Managed Care – PPO | Admitting: Behavioral Health

## 2019-05-03 DIAGNOSIS — Z7901 Long term (current) use of anticoagulants: Secondary | ICD-10-CM

## 2019-05-03 LAB — POCT INR: INR: 2.4 (ref 2.0–3.0)

## 2019-05-03 NOTE — Progress Notes (Signed)
Agree with documentation and plan as written below 

## 2019-05-03 NOTE — Progress Notes (Signed)
Patient presents in office for INR check. She voices adherence to current medication regimen. All patient findings were negative. No changes to diet or other medications. Today's INR reading was 2.4.  Per Dr. Barron Alvine: Continue taking Coumadin 5 MG daily. Return in 4 weeks for INR check.   Informed patient of the provider's recommendations. She verbalized understanding and did not have any further questions or concerns prior to leaving the nurse visit.  Next appointment has been scheduled for 05/31/2019 at 9:00 AM.

## 2019-05-03 NOTE — Patient Instructions (Signed)
Per Dr. Barron Alvine: Continue taking Coumadin 5 MG daily. Return in 4 weeks for INR check.

## 2019-05-31 ENCOUNTER — Ambulatory Visit (INDEPENDENT_AMBULATORY_CARE_PROVIDER_SITE_OTHER): Payer: BC Managed Care – PPO | Admitting: Behavioral Health

## 2019-05-31 ENCOUNTER — Other Ambulatory Visit: Payer: Self-pay

## 2019-05-31 DIAGNOSIS — Z7901 Long term (current) use of anticoagulants: Secondary | ICD-10-CM

## 2019-05-31 LAB — POCT INR: INR: 2.8 (ref 2.0–3.0)

## 2019-05-31 NOTE — Progress Notes (Signed)
Patient presents in office for INR check. She voices adherence to current medication regimen. Patient reported no changes with medications or diet. All other findings were negative. INR reading during today's visit was 2.8.  Informed patient to continue taking Coumadin 5 MG daily. Return in 4 weeks for INR check. She verbalized understanding and did not have any further questions or concerns prior to leaving the nurse visit.  Next appointment has been scheduled for 06/28/19 at 9:20 AM.

## 2019-05-31 NOTE — Patient Instructions (Signed)
Continue taking Coumadin 5 MG daily. Return in 4 weeks for INR check.

## 2019-06-09 DIAGNOSIS — R030 Elevated blood-pressure reading, without diagnosis of hypertension: Secondary | ICD-10-CM | POA: Diagnosis not present

## 2019-06-09 DIAGNOSIS — Z20828 Contact with and (suspected) exposure to other viral communicable diseases: Secondary | ICD-10-CM | POA: Diagnosis not present

## 2019-06-11 ENCOUNTER — Encounter: Payer: Self-pay | Admitting: Family Medicine

## 2019-06-13 ENCOUNTER — Other Ambulatory Visit: Payer: Self-pay

## 2019-06-13 MED ORDER — LOSARTAN POTASSIUM 50 MG PO TABS: 50.0000 mg | ORAL_TABLET | Freq: Every day | ORAL | 3 refills | Status: DC

## 2019-06-14 ENCOUNTER — Ambulatory Visit: Payer: BC Managed Care – PPO | Admitting: Family Medicine

## 2019-06-14 ENCOUNTER — Encounter: Payer: Self-pay | Admitting: Family Medicine

## 2019-06-27 ENCOUNTER — Other Ambulatory Visit: Payer: Self-pay

## 2019-06-28 ENCOUNTER — Ambulatory Visit: Payer: BC Managed Care – PPO

## 2019-06-29 ENCOUNTER — Encounter: Payer: Self-pay | Admitting: Family Medicine

## 2019-06-29 DIAGNOSIS — I10 Essential (primary) hypertension: Secondary | ICD-10-CM

## 2019-07-05 ENCOUNTER — Other Ambulatory Visit (INDEPENDENT_AMBULATORY_CARE_PROVIDER_SITE_OTHER): Payer: BC Managed Care – PPO

## 2019-07-05 ENCOUNTER — Ambulatory Visit (INDEPENDENT_AMBULATORY_CARE_PROVIDER_SITE_OTHER): Payer: BC Managed Care – PPO | Admitting: Behavioral Health

## 2019-07-05 ENCOUNTER — Other Ambulatory Visit: Payer: Self-pay

## 2019-07-05 DIAGNOSIS — Z7901 Long term (current) use of anticoagulants: Secondary | ICD-10-CM

## 2019-07-05 LAB — PROTIME-INR
INR: 3.4 ratio — ABNORMAL HIGH (ref 0.8–1.0)
Prothrombin Time: 38 s — ABNORMAL HIGH (ref 9.6–13.1)

## 2019-07-05 LAB — POCT INR: INR: 4.5 — AB (ref 2.0–3.0)

## 2019-07-05 NOTE — Progress Notes (Signed)
Patient presents in office for INR check. She reported no changes with medications or diet. Patient voices adherence to current medication regimen. All patient findings were negative. INR reading during today's visit was 4.5.  Per Dr. Doreene Burke: Hold Coumadin dose for next three days beginning today. Return on Friday, 07/07/2019 for INR recheck.   Informed patient of the provider's instructions. She verbalized understanding and did not have any further questions or concerns.  Next appointment has been scheduled for 07/07/19 at 9:00 AM.

## 2019-07-05 NOTE — Patient Instructions (Signed)
Per Dr. Doreene Burke: Hold Coumadin dose for next three days beginning today. Return on Friday, 07/07/2019 for INR check.

## 2019-07-07 ENCOUNTER — Encounter: Payer: Self-pay | Admitting: Family Medicine

## 2019-07-07 ENCOUNTER — Ambulatory Visit (INDEPENDENT_AMBULATORY_CARE_PROVIDER_SITE_OTHER): Payer: BC Managed Care – PPO | Admitting: Family Medicine

## 2019-07-07 ENCOUNTER — Ambulatory Visit: Payer: BC Managed Care – PPO

## 2019-07-07 ENCOUNTER — Other Ambulatory Visit: Payer: Self-pay

## 2019-07-07 VITALS — BP 140/100 | HR 73 | Temp 98.5°F | Ht 68.0 in

## 2019-07-07 DIAGNOSIS — I10 Essential (primary) hypertension: Secondary | ICD-10-CM | POA: Diagnosis not present

## 2019-07-07 DIAGNOSIS — Z7901 Long term (current) use of anticoagulants: Secondary | ICD-10-CM

## 2019-07-07 LAB — POCT INR: INR: 2.3 (ref 2.0–3.0)

## 2019-07-07 MED ORDER — LOSARTAN POTASSIUM-HCTZ 100-25 MG PO TABS: 1.0000 | ORAL_TABLET | Freq: Every day | ORAL | 3 refills | Status: DC

## 2019-07-07 NOTE — Progress Notes (Signed)
Lauren Burns is a 44 y.o. female  Chief Complaint  Patient presents with  . Hypertension    Pt took bp this morning at home 187/127, using a wrist cuff.  Around 8;30am    HPI: Lauren Burns is a 44 y.o. female here for elevated BP. She states home BP this AM 187/127. Home BP 140-160/80-100. She is on losartan 50mg  BID and has been taking this dosing x 1+ week.  Pt notes more frequent headaches. Pt states she never feels well d/t chronic pain, anxiety so hard to know if she is having associated symptoms.  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   Past Medical History:  Diagnosis Date  . Antiphospholipid antibody syndrome (HCC)   . Anxiety   . Arthritis   . Depression   . Endometriosis   . Endometriosis   . Fatigue   . Fibromyalgia   . GERD (gastroesophageal reflux disease)   . Heart murmur   . Hypertension   . IBS (irritable bowel syndrome)   . IBS (irritable bowel syndrome)   . Obesity   . Pulmonary embolism (HCC) 2004  . Shortness of breath   . Sleep apnea   . Vitamin D deficiency     History reviewed. No pertinent surgical history.  Social History   Socioeconomic History  . Marital status: Married    Spouse name: Not on file  . Number of children: Not on file  . Years of education: Not on file  . Highest education level: Not on file  Occupational History  . Occupation: 2005  Tobacco Use  . Smoking status: Former Smoker    Quit date: 12/17/2008    Years since quitting: 10.5  . Smokeless tobacco: Never Used  Substance and Sexual Activity  . Alcohol use: No  . Drug use: No  . Sexual activity: Not on file  Other Topics Concern  . Not on file  Social History Narrative  . Not on file   Social Determinants of Health   Financial Resource Strain:   . Difficulty of Paying Living Expenses:   Food Insecurity:   . Worried About 12/19/2008 in the Last Year:   . Programme researcher, broadcasting/film/video in the Last Year:     Transportation Needs:   . Barista (Medical):   Freight forwarder Lack of Transportation (Non-Medical):   Physical Activity:   . Days of Exercise per Week:   . Minutes of Exercise per Session:   Stress:   . Feeling of Stress :   Social Connections:   . Frequency of Communication with Friends and Family:   . Frequency of Social Gatherings with Friends and Family:   . Attends Religious Services:   . Active Member of Clubs or Organizations:   . Attends [employer] Meetings:   Banker Marital Status:   Intimate Partner Violence:   . Fear of Current or Ex-Partner:   . Emotionally Abused:   [place]land [place] Physically Abused:   . Sexually Abused:     Family History  Problem Relation Age of Onset  . Hyperlipidemia Mother   . Depression Mother   . Anxiety disorder Mother   . Obesity Mother   . Heart disease Father   . Alcohol abuse Father   . Hypertension Father   . Cancer Father   . Alcoholism Father   . Obesity Father      Immunization History  Administered Date(s) Administered  .  Influenza,inj,Quad PF,6+ Mos 12/25/2014, 12/17/2016, 12/16/2017  . Influenza-Unspecified 11/10/2016  . Tdap 09/27/2009    Outpatient Encounter Medications as of 07/07/2019  Medication Sig  . DULoxetine (CYMBALTA) 30 MG capsule Take 30 mg by mouth daily.  Marland Kitchen losartan (COZAAR) 50 MG tablet Take 1 tablet (50 mg total) by mouth in the morning and at bedtime.  Marland Kitchen warfarin (COUMADIN) 5 MG tablet Take 1 tablet (5 mg total) by mouth daily.  . [DISCONTINUED] buPROPion (WELLBUTRIN SR) 200 MG 12 hr tablet TAKE 1 TABLET BY MOUTH EVERY DAY AT 12 NOON (Patient not taking: Reported on 07/07/2019)   No facility-administered encounter medications on file as of 07/07/2019.     ROS: Pertinent positives and negatives noted in HPI. Remainder of ROS non-contributory  No Known Allergies  BP (!) 140/100 (BP Location: Left Arm, Patient Position: Sitting, Cuff Size: Large)   Pulse 73   Temp 98.5 F (36.9 C) (Oral)   Ht 5\' 8"   (1.727 m)   LMP 12/21/2016   SpO2 96%   BMI 41.08 kg/m   BP Readings from Last 3 Encounters:  07/07/19 (!) 140/100  07/21/18 (!) 130/100  04/28/18 134/84     Physical Exam  Constitutional: She is oriented to person, place, and time. She appears well-developed and well-nourished. No distress.  Cardiovascular: Normal rate and regular rhythm.  Neurological: She is alert and oriented to person, place, and time.  Psychiatric: She has a normal mood and affect. Her behavior is normal.     A/P:  1. Long term (current) use of anticoagulants - POCT INR - at goal - f/u in 2 wks  2. Essential hypertension - uncontrolled  - home BP elevated Rx: - losartan-hydrochlorothiazide (HYZAAR) 100-25 MG tablet; Take 1 tablet by mouth daily.  Dispense: 90 tablet; Refill: 3 - check BP at home, keep log, and send MyChart msg at that time  This visit occurred during the SARS-CoV-2 public health emergency.  Safety protocols were in place, including screening questions prior to the visit, additional usage of staff PPE, and extensive cleaning of exam room while observing appropriate contact time as indicated for disinfecting solutions.

## 2019-07-07 NOTE — Patient Instructions (Addendum)
Stop losartan 50mg  twice per day Start losartan/HCTZ 100/25mg  1 tab daily Check BP daily at least 1 hour after taking medication and keep log

## 2019-07-18 ENCOUNTER — Encounter: Payer: Self-pay | Admitting: Family Medicine

## 2019-07-18 DIAGNOSIS — I10 Essential (primary) hypertension: Secondary | ICD-10-CM

## 2019-07-18 MED ORDER — DULOXETINE HCL 30 MG PO CPEP: 30.0000 mg | ORAL_CAPSULE | Freq: Every day | ORAL | 3 refills | Status: DC

## 2019-07-18 NOTE — Telephone Encounter (Signed)
Last OV 07/07/19 Last fill 12/29/18 Historical provider.

## 2019-07-19 MED ORDER — AMLODIPINE BESYLATE 10 MG PO TABS: 10.0000 mg | ORAL_TABLET | Freq: Every day | ORAL | 3 refills | Status: DC

## 2019-07-20 ENCOUNTER — Other Ambulatory Visit: Payer: Self-pay

## 2019-07-20 ENCOUNTER — Other Ambulatory Visit: Payer: BC Managed Care – PPO

## 2019-07-20 ENCOUNTER — Ambulatory Visit (INDEPENDENT_AMBULATORY_CARE_PROVIDER_SITE_OTHER): Payer: BC Managed Care – PPO | Admitting: Behavioral Health

## 2019-07-20 DIAGNOSIS — Z7901 Long term (current) use of anticoagulants: Secondary | ICD-10-CM

## 2019-07-20 LAB — POCT INR: INR: 4.2 — AB (ref 2.0–3.0)

## 2019-07-20 LAB — PROTIME-INR
INR: 3.6 ratio — ABNORMAL HIGH (ref 0.8–1.0)
Prothrombin Time: 39.7 s — ABNORMAL HIGH (ref 9.6–13.1)

## 2019-07-20 NOTE — Addendum Note (Signed)
Addended by: Varney Biles on: 07/20/2019 10:54 AM   Modules accepted: Orders

## 2019-07-20 NOTE — Progress Notes (Signed)
Patient presents in clinic for INR recheck. She verbalizes adherence with current medication regimen. Patient reported no changes with diet or other medications. All patient findings were negative. INR reading during visit was 4.2.  Per Dr. Barron Alvine: Hold Coumadin dose for today. Complete PT/INR lab draw. Will contact you with the results and further instructions.  Informed patient of the provider's recommendations. She voiced understanding and did not have any further questions or concerns prior to leaving the nurse visit.

## 2019-07-20 NOTE — Patient Instructions (Signed)
Per Dr. Barron Alvine: Hold Coumadin dose for today. Complete PT/INR lab draw. Will contact you with the results and further instructions.

## 2019-07-21 ENCOUNTER — Encounter: Payer: Self-pay | Admitting: Family Medicine

## 2019-07-26 ENCOUNTER — Encounter: Payer: Self-pay | Admitting: Family Medicine

## 2019-08-01 ENCOUNTER — Encounter: Payer: Self-pay | Admitting: Family Medicine

## 2019-08-01 NOTE — Telephone Encounter (Signed)
Please see message and advise.  Thank you. ° °

## 2019-08-03 ENCOUNTER — Ambulatory Visit: Payer: BC Managed Care – PPO | Admitting: Family Medicine

## 2019-08-03 ENCOUNTER — Other Ambulatory Visit: Payer: Self-pay

## 2019-08-04 ENCOUNTER — Ambulatory Visit: Payer: BC Managed Care – PPO | Admitting: Family

## 2019-08-04 ENCOUNTER — Encounter: Payer: Self-pay | Admitting: Family

## 2019-08-04 VITALS — BP 140/100 | HR 77 | Temp 97.9°F | Ht 68.0 in | Wt 337.4 lb

## 2019-08-04 DIAGNOSIS — I10 Essential (primary) hypertension: Secondary | ICD-10-CM | POA: Diagnosis not present

## 2019-08-04 DIAGNOSIS — R609 Edema, unspecified: Secondary | ICD-10-CM

## 2019-08-04 MED ORDER — VERAPAMIL HCL ER 120 MG PO TBCR: 120.0000 mg | EXTENDED_RELEASE_TABLET | Freq: Every day | ORAL | 1 refills | Status: DC

## 2019-08-04 NOTE — Progress Notes (Signed)
Acute Office Visit  Subjective:    Patient ID: Lauren Burns, female    DOB: 12/20/1975, 44 y.o.   MRN: [id number]  Chief Complaint  Patient presents with  . Foot Swelling    Pt c/o of both ankles and feet swelling x 4days.  Pt said by the end of the day they after work, she noticed the swelling has worsened.  Pt alos the she has noticed she has been having SOB, x 2weeks.    HPI Patient is in today with complaints of swelling in her feet and ankles x 2 weeks. She reports staring Amlodipine approximately 2 weeks ago. She has not had any significant dietary changes. She works in [employer] and is on her feel a lot.    Past Medical History:  Diagnosis Date  . Antiphospholipid antibody syndrome (HCC)   . Anxiety   . Arthritis   . Depression   . Endometriosis   . Endometriosis   . Fatigue   . Fibromyalgia   . GERD (gastroesophageal reflux disease)   . Heart murmur   . Hypertension   . IBS (irritable bowel syndrome)   . IBS (irritable bowel syndrome)   . Obesity   . Pulmonary embolism (HCC) 2004  . Shortness of breath   . Sleep apnea   . Vitamin D deficiency     History reviewed. No pertinent surgical history.  Family History  Problem Relation Age of Onset  . Hyperlipidemia Mother   . Depression Mother   . Anxiety disorder Mother   . Obesity Mother   . Heart disease Father   . Alcohol abuse Father   . Hypertension Father   . Cancer Father   . Alcoholism Father   . Obesity Father     Social History   Socioeconomic History  . Marital status: Married    Spouse name: Not on file  . Number of children: Not on file  . Years of education: Not on file  . Highest education level: Not on file  Occupational History  . Occupation: Research scientist (life sciences)  Tobacco Use  . Smoking status: Former Smoker    Quit date: 12/17/2008    Years since quitting: 10.6  . Smokeless tobacco: Never Used  Substance and Sexual Activity  . Alcohol use: No  . Drug use: No  .  Sexual activity: Not on file  Other Topics Concern  . Not on file  Social History Narrative  . Not on file   Social Determinants of Health   Financial Resource Strain:   . Difficulty of Paying Living Expenses:   Food Insecurity:   . Worried About Programme researcher, broadcasting/film/video in the Last Year:   . Barista in the Last Year:   Transportation Needs:   . Freight forwarder (Medical):   Marland Kitchen Lack of Transportation (Non-Medical):   Physical Activity:   . Days of Exercise per Week:   . Minutes of Exercise per Session:   Stress:   . Feeling of Stress :   Social Connections:   . Frequency of Communication with Friends and Family:   . Frequency of Social Gatherings with Friends and Family:   . Attends Religious Services:   . Active Member of Clubs or Organizations:   . Attends Banker Meetings:   Marland Kitchen Marital Status:   Intimate Partner Violence:   . Fear of Current or Ex-Partner:   . Emotionally Abused:   Marland Kitchen Physically Abused:   .  Sexually Abused:     Outpatient Medications Prior to Visit  Medication Sig Dispense Refill  . DULoxetine (CYMBALTA) 30 MG capsule Take 1 capsule (30 mg total) by mouth daily. 90 capsule 3  . losartan-hydrochlorothiazide (HYZAAR) 100-25 MG tablet Take 1 tablet by mouth daily. 90 tablet 3  . warfarin (COUMADIN) 5 MG tablet Take 1 tablet (5 mg total) by mouth daily. 90 tablet 3  . amLODipine (NORVASC) 10 MG tablet Take 1 tablet (10 mg total) by mouth daily. 90 tablet 3   No facility-administered medications prior to visit.    No Known Allergies  Review of Systems  Respiratory: Negative for cough and chest tightness.        Feels winded at times  Cardiovascular: Positive for leg swelling. Negative for chest pain and palpitations.  Skin: Negative.   Psychiatric/Behavioral: Negative.   All other systems reviewed and are negative.      Objective:    Physical Exam Constitutional:      Appearance: She is obese.  Cardiovascular:     Rate  and Rhythm: Normal rate and regular rhythm.  Pulmonary:     Effort: Pulmonary effort is normal.     Breath sounds: Normal breath sounds.  Abdominal:     General: Bowel sounds are normal.     Palpations: Abdomen is soft.  Musculoskeletal:        General: Normal range of motion.     Cervical back: Normal range of motion and neck supple.     Right lower leg: Edema present.     Left lower leg: Edema present.  Skin:    General: Skin is warm and dry.  Neurological:     General: No focal deficit present.     Mental Status: She is alert and oriented to person, place, and time.  Psychiatric:        Mood and Affect: Mood normal.     BP (!) 140/100 (BP Location: Left Arm, Patient Position: Sitting, Cuff Size: Large)   Pulse 77   Temp 97.9 F (36.6 C) (Temporal)   Ht 5\' 8"  (1.727 m)   Wt (!) 337 lb 6.4 oz (153 kg)   LMP 12/21/2016   SpO2 99%   BMI 51.30 kg/m  Wt Readings from Last 3 Encounters:  08/04/19 (!) 337 lb 6.4 oz (153 kg)  07/21/18 270 lb 3.2 oz (122.6 kg)  04/28/18 249 lb (112.9 kg)    Health Maintenance Due  Topic Date Due  . HIV Screening  Never done  . COVID-19 Vaccine (1) Never done  . PAP SMEAR-Modifier  Never done    There are no preventive care reminders to display for this patient.   Lab Results  Component Value Date   TSH 2.140 11/12/2016   Lab Results  Component Value Date   WBC 5.9 11/12/2016   HGB 13.7 11/12/2016   HCT 41.2 11/12/2016   MCV 89 11/12/2016   PLT 297 11/12/2016   Lab Results  Component Value Date   NA 141 11/30/2017   K 3.9 11/30/2017   CO2 23 11/30/2017   GLUCOSE 43 (L) 11/30/2017   BUN 16 11/30/2017   CREATININE 0.87 11/30/2017   BILITOT 0.6 11/30/2017   ALKPHOS 78 11/30/2017   AST 16 11/30/2017   ALT 13 11/30/2017   PROT 6.9 11/30/2017   ALBUMIN 4.4 11/30/2017   CALCIUM 9.2 11/30/2017   Lab Results  Component Value Date   CHOL 205 (H) 11/30/2017   Lab Results  Component  Value Date   HDL 57 11/30/2017   Lab  Results  Component Value Date   LDLCALC 133 (H) 11/30/2017   Lab Results  Component Value Date   TRIG 73 11/30/2017   Lab Results  Component Value Date   CHOLHDL 3.9 05/21/2016   Lab Results  Component Value Date   HGBA1C 5.4 11/30/2017       Assessment & Plan:   Problem List Items Addressed This Visit    None    Visit Diagnoses    Hypertension, uncontrolled    -  Primary   Relevant Medications   verapamil (CALAN-SR) 120 MG CR tablet   Edema, unspecified type           Meds ordered this encounter  Medications  . verapamil (CALAN-SR) 120 MG CR tablet    Sig: Take 1 tablet (120 mg total) by mouth at bedtime.    Dispense:  30 tablet    Refill:  1   Recheck blood pressure in 2-3 weeks.   Eulis Foster, FNP

## 2019-08-04 NOTE — Patient Instructions (Signed)

## 2019-08-30 ENCOUNTER — Encounter: Payer: Self-pay | Admitting: Family Medicine

## 2019-08-30 DIAGNOSIS — R609 Edema, unspecified: Secondary | ICD-10-CM

## 2019-08-30 DIAGNOSIS — I10 Essential (primary) hypertension: Secondary | ICD-10-CM

## 2019-08-31 MED ORDER — FUROSEMIDE 20 MG PO TABS: 20.0000 mg | ORAL_TABLET | Freq: Every day | ORAL | 1 refills | Status: DC

## 2019-09-08 ENCOUNTER — Encounter: Payer: Self-pay | Admitting: Family Medicine

## 2019-09-08 DIAGNOSIS — M5416 Radiculopathy, lumbar region: Secondary | ICD-10-CM

## 2019-09-08 DIAGNOSIS — G8929 Other chronic pain: Secondary | ICD-10-CM

## 2019-09-18 ENCOUNTER — Ambulatory Visit: Payer: BC Managed Care – PPO | Admitting: Nurse Practitioner

## 2019-09-18 ENCOUNTER — Encounter: Payer: Self-pay | Admitting: Family Medicine

## 2019-10-02 ENCOUNTER — Encounter: Payer: Self-pay | Admitting: Family Medicine

## 2019-10-02 DIAGNOSIS — M5416 Radiculopathy, lumbar region: Secondary | ICD-10-CM

## 2019-10-03 ENCOUNTER — Other Ambulatory Visit: Payer: Self-pay | Admitting: Family Medicine

## 2019-10-03 DIAGNOSIS — G8929 Other chronic pain: Secondary | ICD-10-CM

## 2019-10-03 DIAGNOSIS — M5416 Radiculopathy, lumbar region: Secondary | ICD-10-CM

## 2019-10-04 ENCOUNTER — Encounter: Payer: Self-pay | Admitting: Family Medicine

## 2019-10-04 DIAGNOSIS — R609 Edema, unspecified: Secondary | ICD-10-CM

## 2019-10-04 DIAGNOSIS — I10 Essential (primary) hypertension: Secondary | ICD-10-CM

## 2019-10-04 DIAGNOSIS — G8929 Other chronic pain: Secondary | ICD-10-CM

## 2019-10-04 MED ORDER — PREDNISONE 20 MG PO TABS: ORAL_TABLET | ORAL | 0 refills | Status: DC

## 2019-10-11 MED ORDER — VERAPAMIL HCL ER 120 MG PO TBCR: 120.0000 mg | EXTENDED_RELEASE_TABLET | Freq: Every day | ORAL | 3 refills | Status: DC

## 2019-10-11 MED ORDER — GABAPENTIN 300 MG PO CAPS: ORAL_CAPSULE | ORAL | 2 refills | Status: DC

## 2019-10-11 NOTE — Telephone Encounter (Signed)
Please see message and advise.  Thank you. Last OV 08/04/19 Last fill 08/04/19  #30/1

## 2019-10-19 ENCOUNTER — Other Ambulatory Visit: Payer: BC Managed Care – PPO

## 2019-10-23 DIAGNOSIS — M5416 Radiculopathy, lumbar region: Secondary | ICD-10-CM | POA: Diagnosis not present

## 2019-10-31 MED ORDER — FUROSEMIDE 20 MG PO TABS: 20.0000 mg | ORAL_TABLET | Freq: Every day | ORAL | 1 refills | Status: DC

## 2019-10-31 NOTE — Addendum Note (Signed)
Addended by: Wyvonne Lenz on: 10/31/2019 10:15 AM   Modules accepted: Orders

## 2019-11-02 ENCOUNTER — Other Ambulatory Visit: Payer: Self-pay

## 2019-11-02 ENCOUNTER — Encounter: Payer: Self-pay | Admitting: Family Medicine

## 2019-11-02 ENCOUNTER — Ambulatory Visit: Payer: BC Managed Care – PPO

## 2019-11-02 ENCOUNTER — Other Ambulatory Visit: Payer: Self-pay | Admitting: Family Medicine

## 2019-11-02 DIAGNOSIS — N950 Postmenopausal bleeding: Secondary | ICD-10-CM

## 2019-11-03 ENCOUNTER — Ambulatory Visit: Payer: BC Managed Care – PPO

## 2019-11-03 DIAGNOSIS — Z7901 Long term (current) use of anticoagulants: Secondary | ICD-10-CM

## 2019-11-03 NOTE — Progress Notes (Signed)
Pt here for INR check per Dr. Barron Alvine  Goal INR =  2.0 - 3.0  Last INR = 3.6 (on 07/20/2019)  Pt currently takes Coumadin 5mg  daily at night.  Pt denies recent antibiotics and dietary changes.  Reports recent postmenopausal vaginal bleeding with referral placed for GYN referral and pelvic ultrasound on 11/02/2019.  INR today = 4.2  Pt advised per Dr. 01/02/2020 to hold Coumadin for 4 days, and resume on 11/07/2019 at previous dose of 5mg  daily at night.  Return for repeat INR check in 2 weeks.

## 2019-11-07 ENCOUNTER — Ambulatory Visit: Payer: BC Managed Care – PPO

## 2019-11-09 ENCOUNTER — Ambulatory Visit
Admission: RE | Admit: 2019-11-09 | Discharge: 2019-11-09 | Disposition: A | Discharge status: Home / Self Care | Payer: BC Managed Care – PPO | Source: Ambulatory Visit | Attending: Family Medicine | Admitting: Family Medicine

## 2019-11-09 DIAGNOSIS — N950 Postmenopausal bleeding: Secondary | ICD-10-CM | POA: Diagnosis not present

## 2019-11-09 IMAGING — US US PELVIS COMPLETE WITH TRANSVAGINAL
2 series · 13 of 25 positions shown · non-contrast
Comparison: None

CLINICAL DATA: Postmenopausal bleeding for 3 days



[Series 1: us pelvis complete with transvaginal · 0.36mm/px · 12 of 37 slices shown (1 of 2)]
[im 1/37]
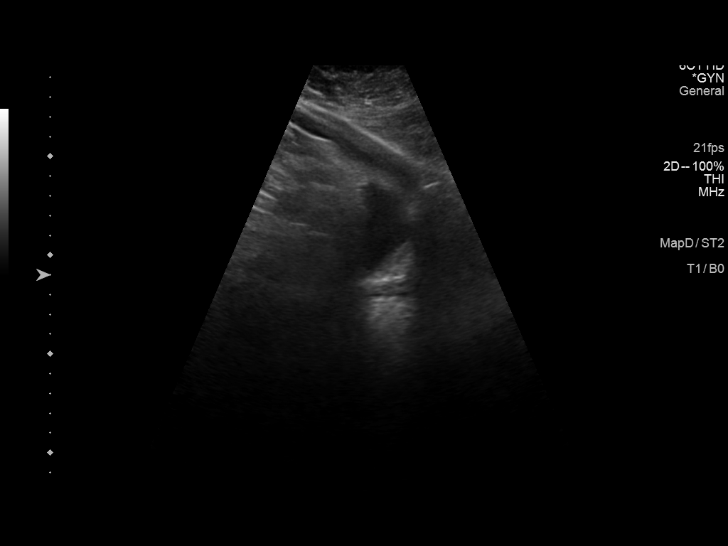
[im 4/37]
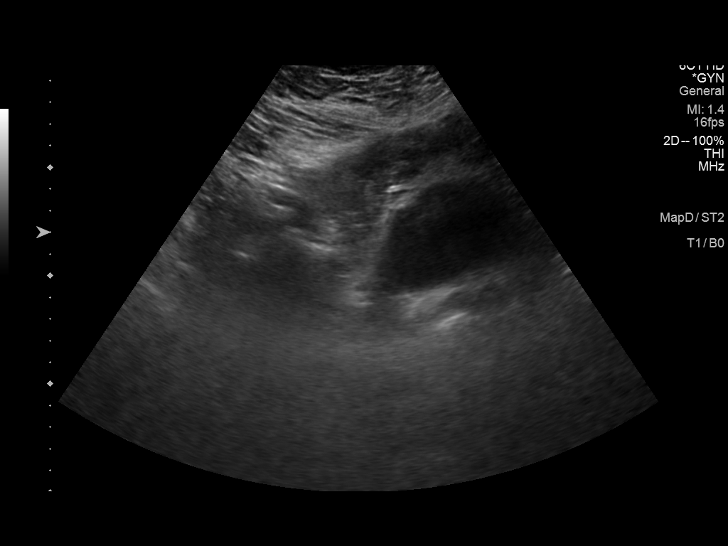
[im 7/37]
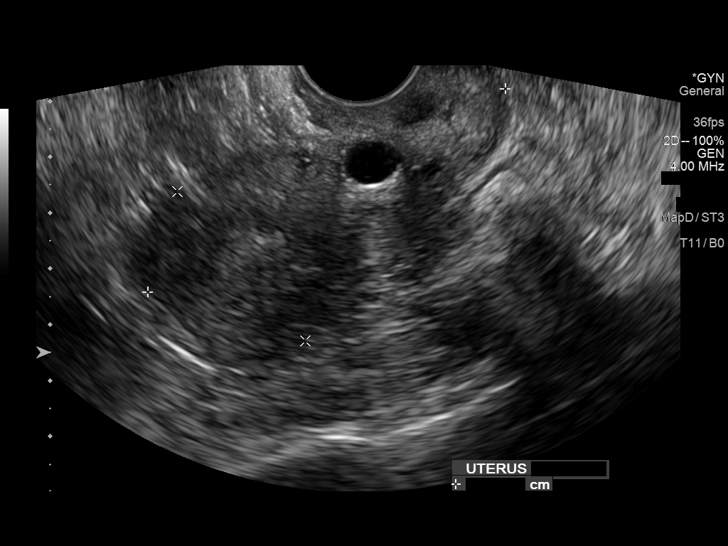
[im 10/37]
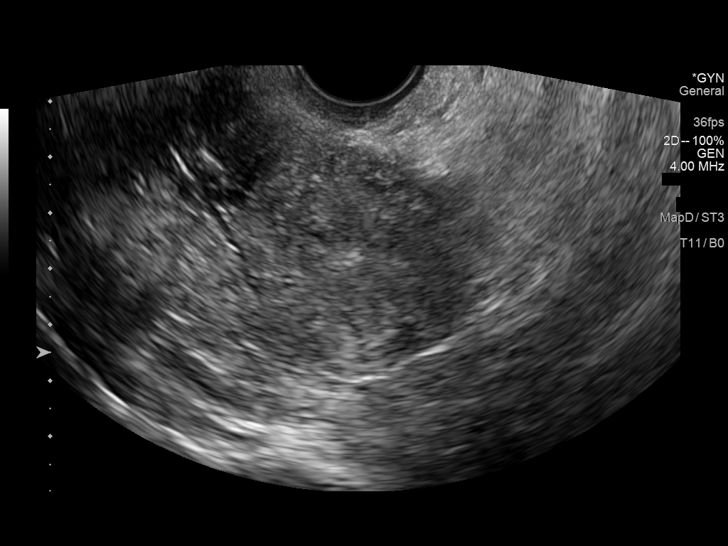
[im 13/37]
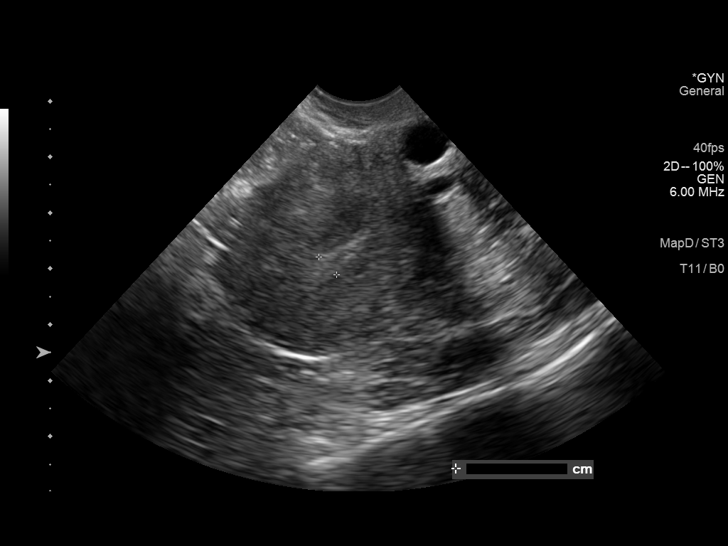
[im 16/37]
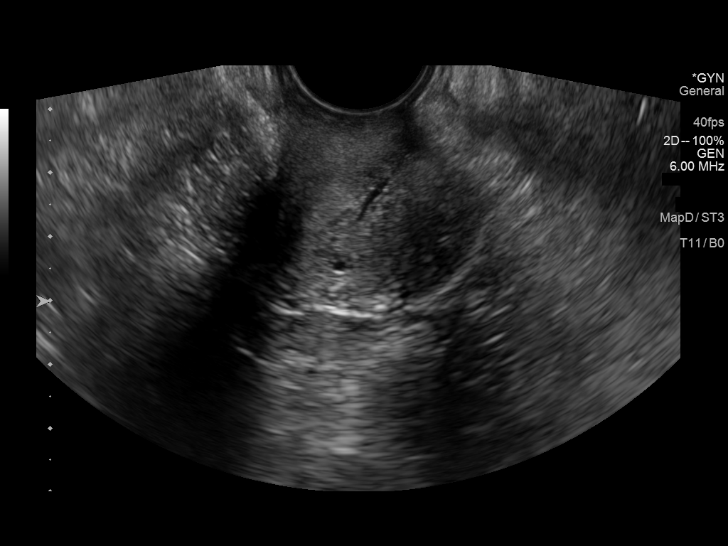
[im 19/37]
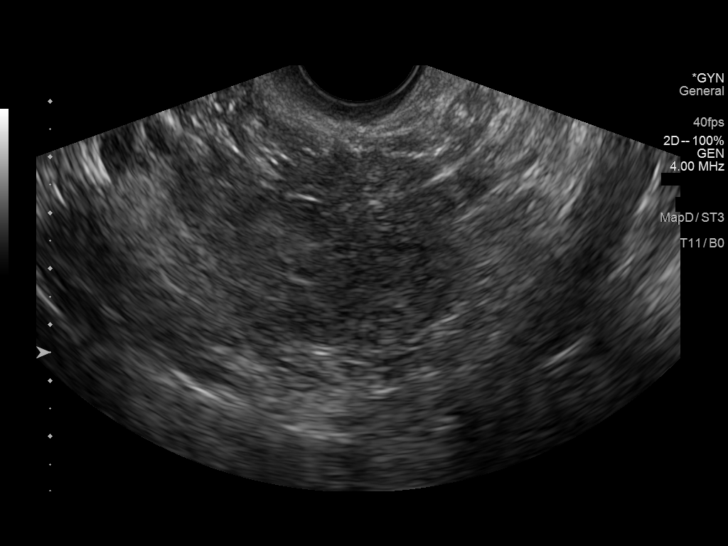
[im 22/37]
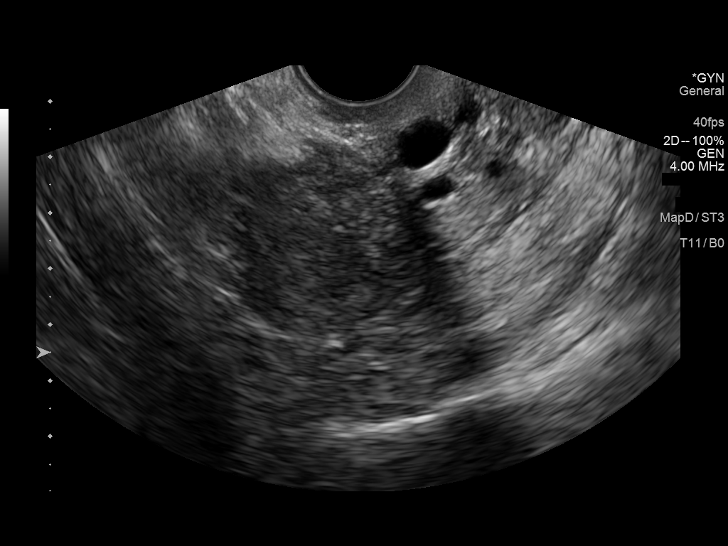
[im 26/37]
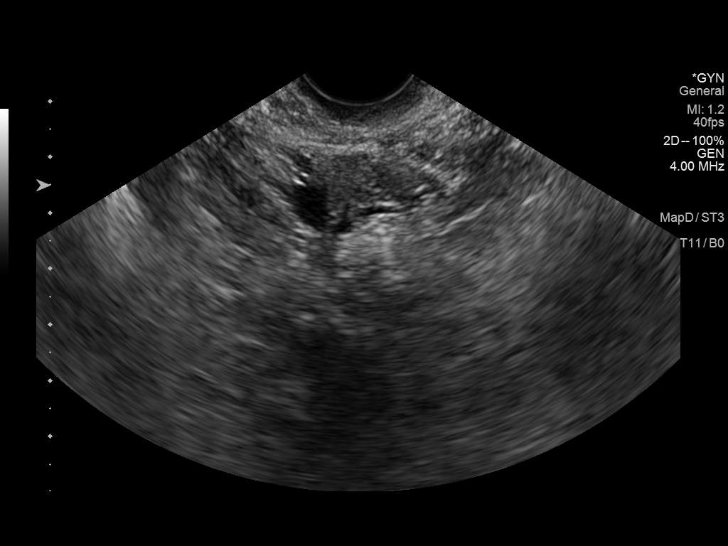
[im 29/37]
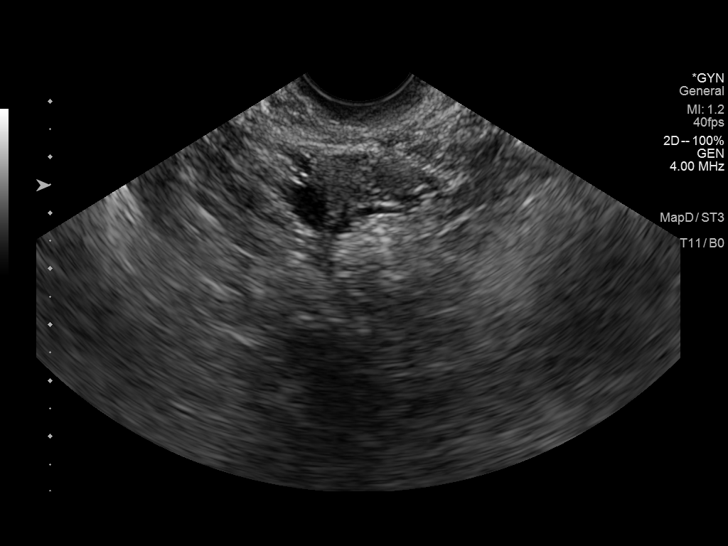
[im 32/37]
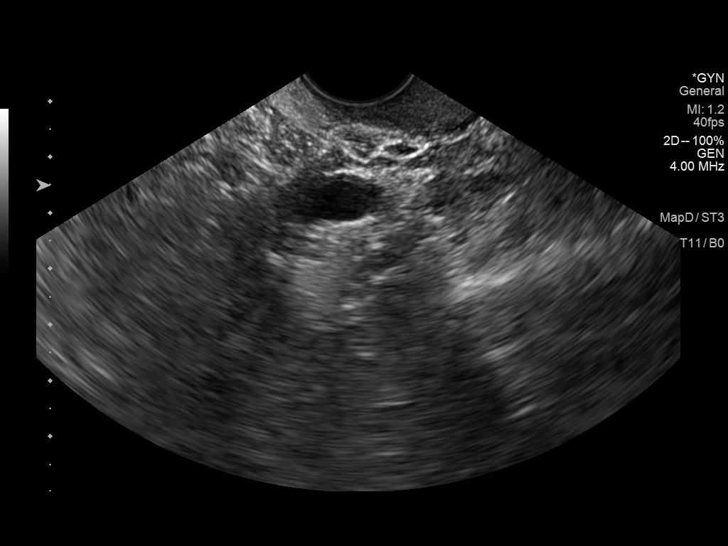
[im 35/37]
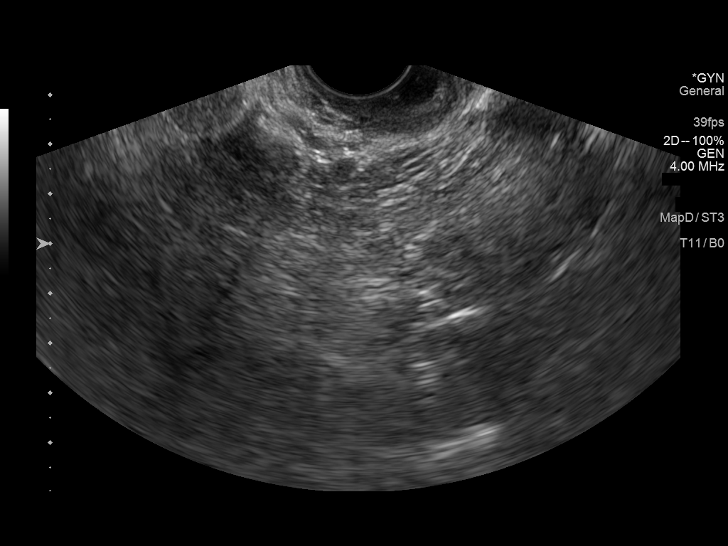

[Series 1001: us pelvis complete with transvaginal · 0.13mm/px · 1 of 2 slices shown (2 of 2)]
[im 1/2]
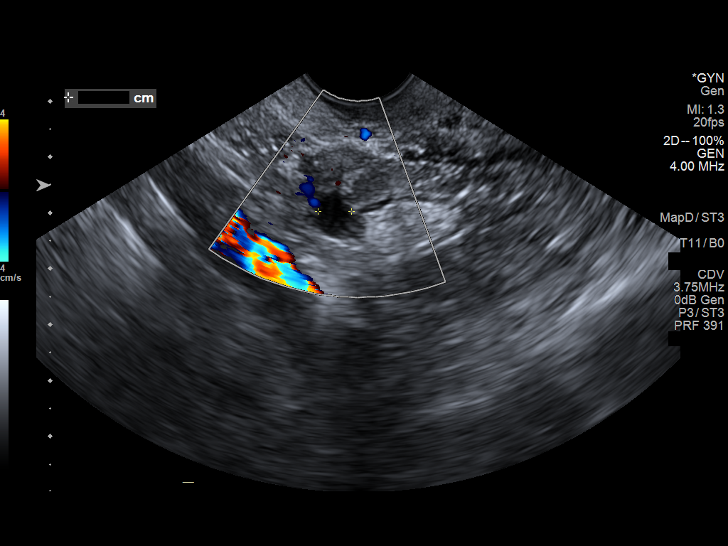

[13 of 25 positions shown; findings below may reference images not displayed]

FINDINGS: Uterus

Measurements: 7.4 x 3.5 x 4.1 cm = volume: 56 mL. Anteverted.
Heterogeneous myometrium. Nabothian cysts at cervix. No focal
uterine mass.

Endometrium

Thickness: 5 mm.  No endometrial fluid or focal abnormality.

Right ovary

Measurements: 2.3 x 1.4 x 1.8 cm = volume: 3.0 mL. Tiny simple cyst
10 x 8 x 6 mm; these are common in postmenopausal females and no
further workup recommended.

Left ovary

Not visualized, likely obscured by bowel

Other findings

No free pelvic fluid or adnexal masses.
IMPRESSION: Nonvisualization of LEFT ovary.

5 mm thick endometrial complex, abnormal for a postmenopausal
patient with bleeding; in the setting of post-menopausal bleeding,
endometrial sampling is indicated to exclude carcinoma. If results
are benign, sonohysterogram should be considered for focal lesion
work-up. (Ref: Radiological Reasoning: Algorithmic Workup of
Abnormal Vaginal Bleeding with Endovaginal Sonography and
Sonohysterography. AJR [UX]; 191:S68-73)

## 2019-11-10 ENCOUNTER — Encounter: Payer: Self-pay | Admitting: Family Medicine

## 2019-11-17 ENCOUNTER — Ambulatory Visit: Payer: BC Managed Care – PPO

## 2019-12-13 ENCOUNTER — Encounter: Payer: Self-pay | Admitting: Family Medicine

## 2019-12-13 DIAGNOSIS — M5442 Lumbago with sciatica, left side: Secondary | ICD-10-CM

## 2019-12-13 DIAGNOSIS — G8929 Other chronic pain: Secondary | ICD-10-CM

## 2019-12-13 MED ORDER — TRAMADOL HCL 50 MG PO TABS: 50.0000 mg | ORAL_TABLET | Freq: Four times a day (QID) | ORAL | 0 refills | Status: DC | PRN

## 2019-12-25 ENCOUNTER — Other Ambulatory Visit: Payer: Self-pay | Admitting: Neurosurgery

## 2019-12-25 DIAGNOSIS — M5416 Radiculopathy, lumbar region: Secondary | ICD-10-CM

## 2019-12-29 ENCOUNTER — Encounter: Payer: Self-pay | Admitting: Family Medicine

## 2019-12-29 ENCOUNTER — Other Ambulatory Visit: Payer: Self-pay

## 2019-12-29 DIAGNOSIS — I10 Essential (primary) hypertension: Secondary | ICD-10-CM

## 2019-12-29 DIAGNOSIS — R609 Edema, unspecified: Secondary | ICD-10-CM

## 2019-12-29 MED ORDER — FUROSEMIDE 20 MG PO TABS: 20.0000 mg | ORAL_TABLET | Freq: Every day | ORAL | 0 refills | Status: DC

## 2020-01-04 MED ORDER — TRAMADOL HCL 50 MG PO TABS: 50.0000 mg | ORAL_TABLET | Freq: Four times a day (QID) | ORAL | 0 refills | Status: DC | PRN

## 2020-01-08 DIAGNOSIS — Z1231 Encounter for screening mammogram for malignant neoplasm of breast: Secondary | ICD-10-CM | POA: Diagnosis not present

## 2020-01-08 LAB — HM MAMMOGRAPHY: HM Mammogram: NORMAL (ref 0–4)

## 2020-01-10 ENCOUNTER — Encounter: Payer: Self-pay | Admitting: Family Medicine

## 2020-01-10 DIAGNOSIS — I10 Essential (primary) hypertension: Secondary | ICD-10-CM

## 2020-01-11 MED ORDER — VERAPAMIL HCL ER 180 MG PO TBCR: 180.0000 mg | EXTENDED_RELEASE_TABLET | Freq: Every day | ORAL | 1 refills | Status: DC

## 2020-01-12 ENCOUNTER — Encounter: Payer: Self-pay | Admitting: Family Medicine

## 2020-01-13 ENCOUNTER — Ambulatory Visit
Admission: RE | Admit: 2020-01-13 | Discharge: 2020-01-13 | Disposition: A | Discharge status: Home / Self Care | Payer: BC Managed Care – PPO | Source: Ambulatory Visit | Attending: Neurosurgery | Admitting: Neurosurgery

## 2020-01-13 ENCOUNTER — Other Ambulatory Visit: Payer: Self-pay

## 2020-01-13 DIAGNOSIS — M545 Low back pain, unspecified: Secondary | ICD-10-CM | POA: Diagnosis not present

## 2020-01-13 DIAGNOSIS — M5416 Radiculopathy, lumbar region: Secondary | ICD-10-CM

## 2020-01-13 IMAGING — MR MR LUMBAR SPINE W/O CM
4 of 5 series · 19 of 48 positions shown · non-contrast
Comparison: None.

CLINICAL DATA: Constant lower back pain radiating down the left leg
for a year or more with electric pulse sensation

EXAM:
MRI LUMBAR SPINE WITHOUT CONTRAST
TECHNIQUE: Multiplanar, multisequence MR imaging of the lumbar spine was
performed. No intravenous contrast was administered.

[Series 6: T2 · sagittal · 4.0mm · 0.73mm/px · 6 of 15 slices shown (1 of 2)]
[im 1/15]
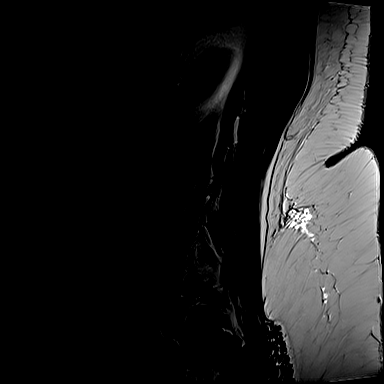
[im 3/15]
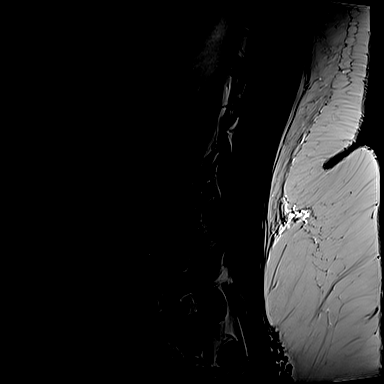
[im 6/15]
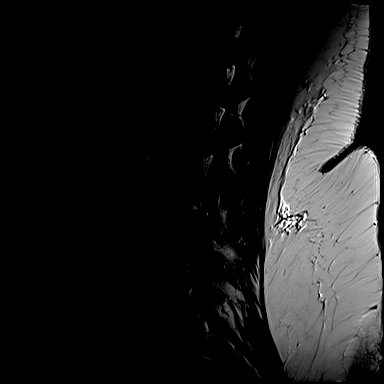
[im 9/15]
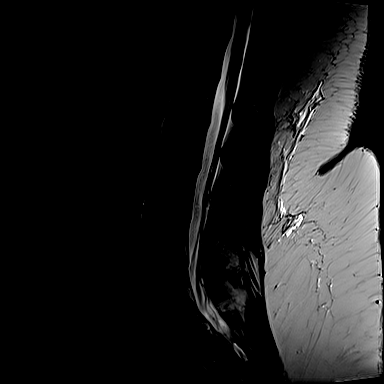
[im 12/15]
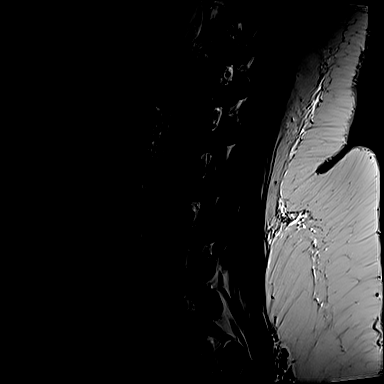
[im 15/15]
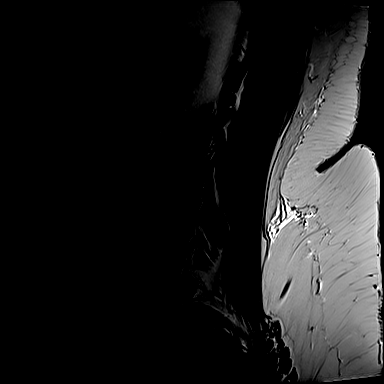

[Series 7: T1 · sagittal · 4.0mm · 0.73mm/px · 3 of 15 slices shown (1 of 2)]
[im 3/15]
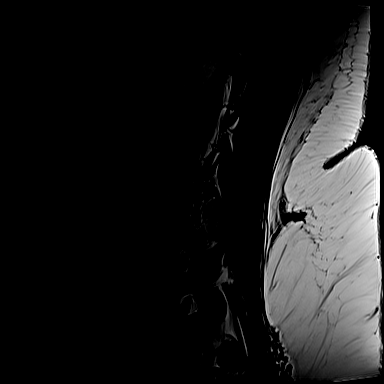
[im 9/15]
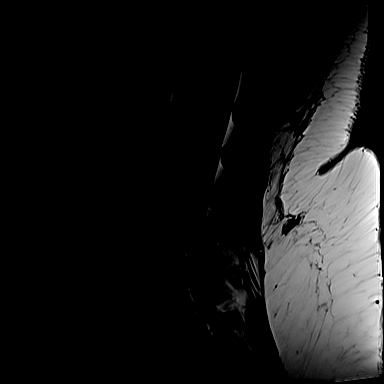
[im 15/15]
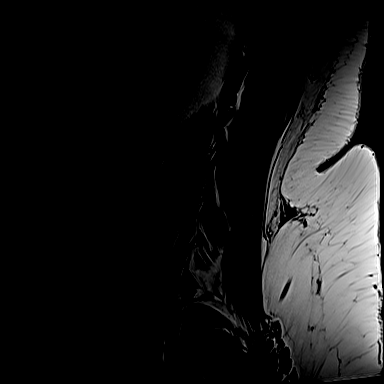

[Series 11: T1 · axial · 4.0mm · 0.28mm/px · z∈[-54,+111]mm · 3 of 41 slices shown (2 of 2)]
[im 6/41]
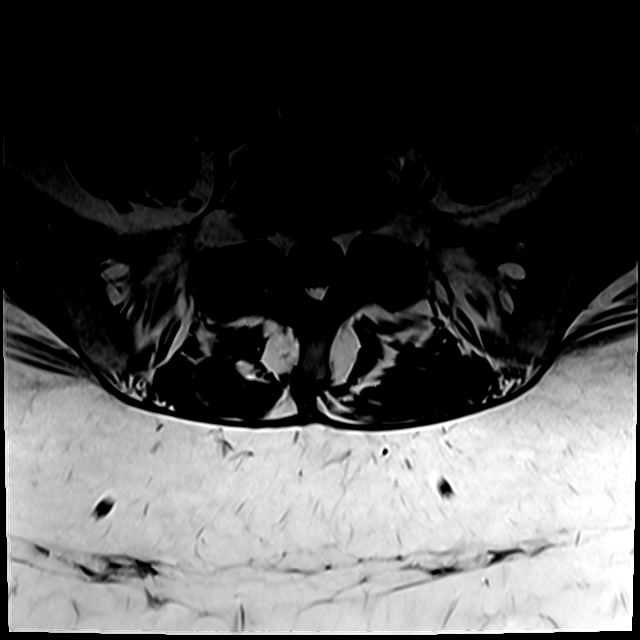
[im 21/41]
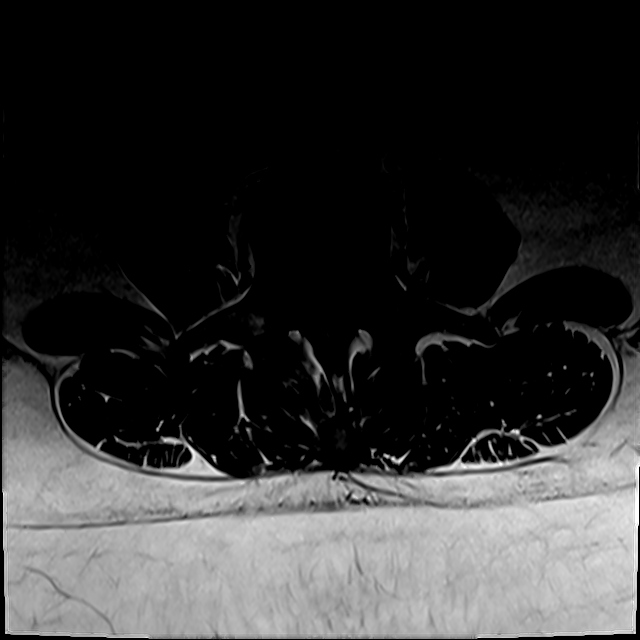
[im 35/41]
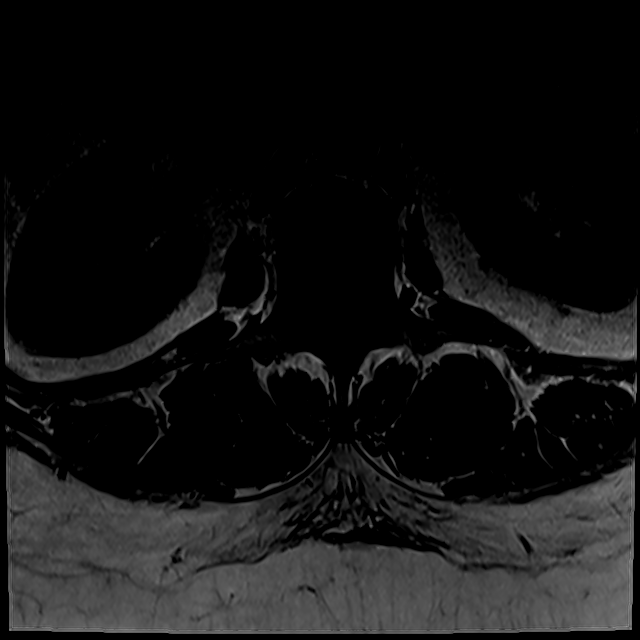

[Series 14: T2 · axial · 4.0mm · 0.28mm/px · z∈[-79,+111]mm · 7 of 41 slices shown (2 of 2)]
[im 1/41]
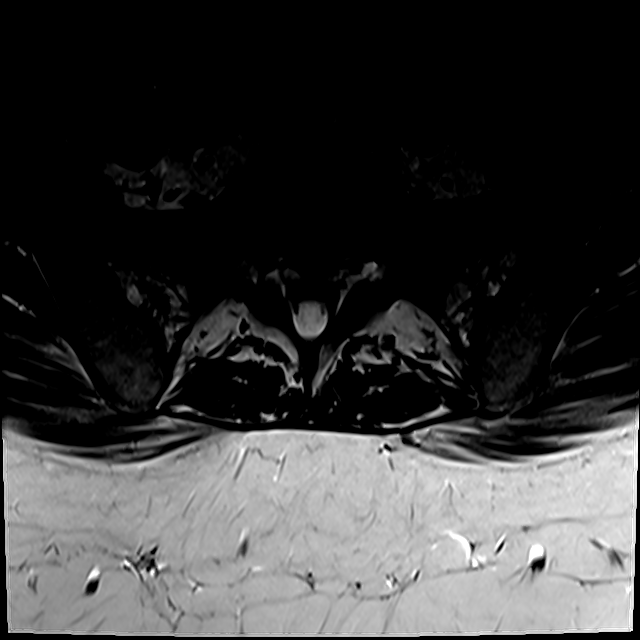
[im 6/41]
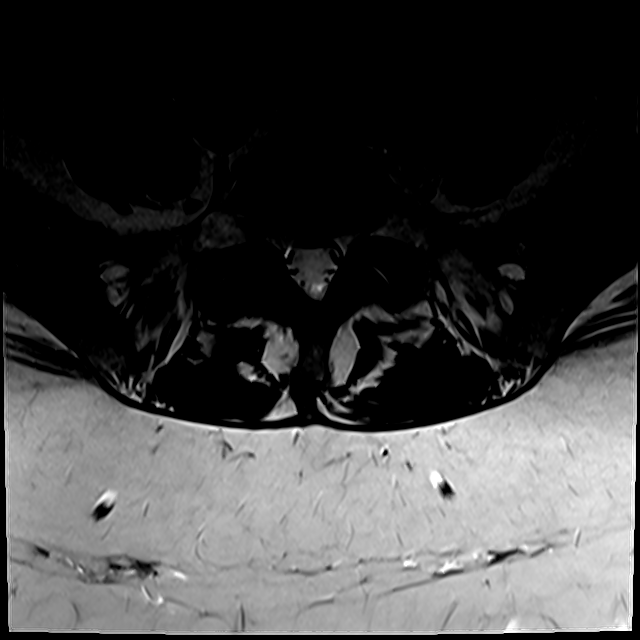
[im 12/41]
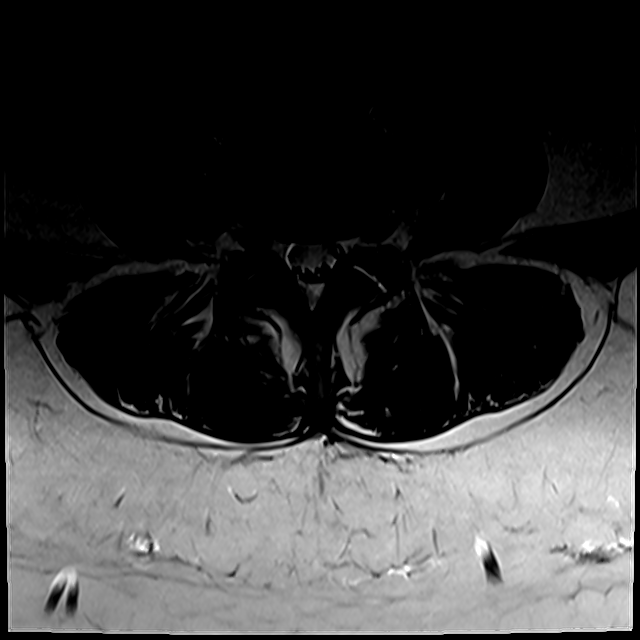
[im 18/41]
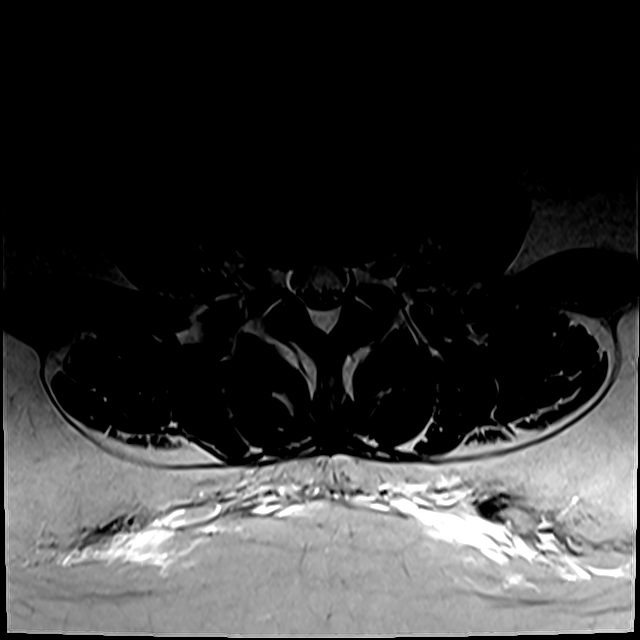
[im 21/41]
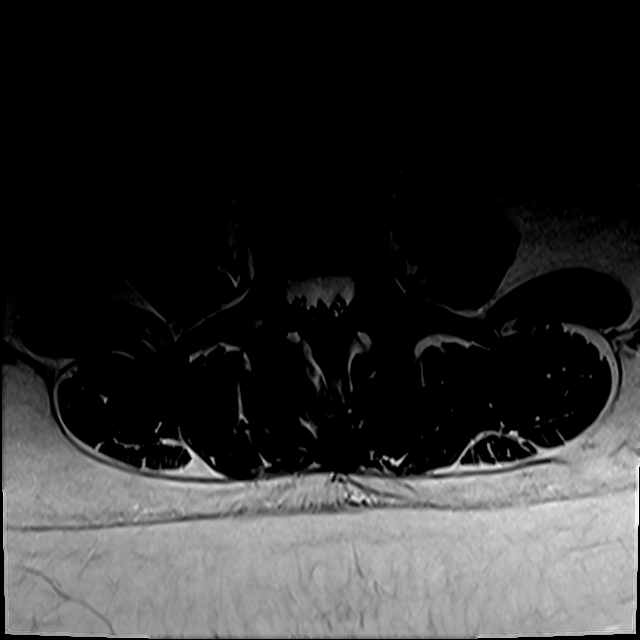
[im 23/41]
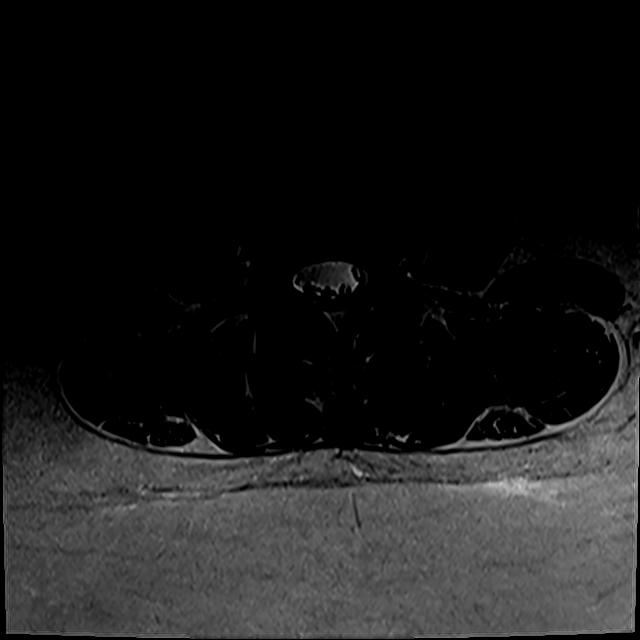
[im 35/41]
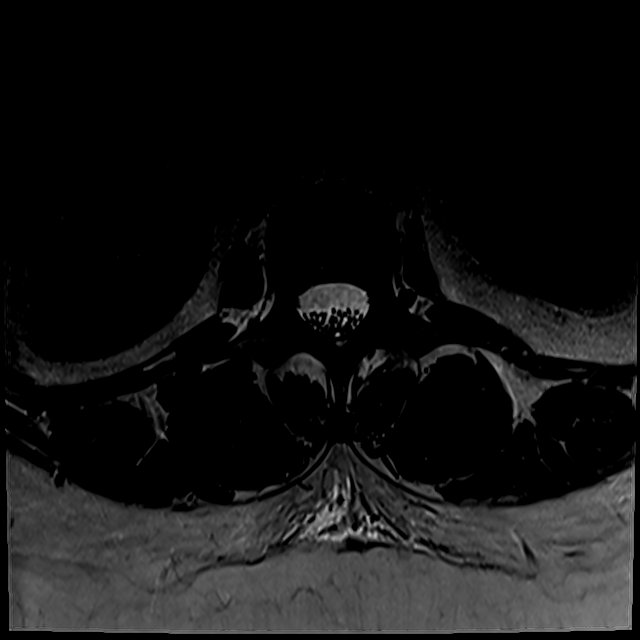

[19 of 48 positions shown; findings below may reference images not displayed]

FINDINGS: Segmentation:  5 lumbar type vertebrae

Alignment:  Normal

Vertebrae:  No fracture, evidence of discitis, or bone lesion.

Conus medullaris and cauda equina: Conus extends to the T12-L1
level. Conus and cauda equina appear normal.

Paraspinal and other soft tissues: Negative

Disc levels:

T12- L1: Unremarkable.

L1-L2: Unremarkable.

L2-L3: Suspect a tiny right foraminal protrusion.

L3-L4: Unremarkable.

L4-L5: Early marginal facet spurring.  Borderline annulus bulging.

L5-S1:Degenerative facet spurring on both sides. No neural
compression.
IMPRESSION: Early degenerative changes without impingement or inflammation to
explain radicular symptoms.

## 2020-01-16 DIAGNOSIS — I10 Essential (primary) hypertension: Secondary | ICD-10-CM | POA: Diagnosis not present

## 2020-01-16 DIAGNOSIS — G5712 Meralgia paresthetica, left lower limb: Secondary | ICD-10-CM | POA: Diagnosis not present

## 2020-01-16 DIAGNOSIS — M549 Dorsalgia, unspecified: Secondary | ICD-10-CM | POA: Diagnosis not present

## 2020-01-16 DIAGNOSIS — Z6841 Body Mass Index (BMI) 40.0 and over, adult: Secondary | ICD-10-CM | POA: Diagnosis not present

## 2020-01-24 ENCOUNTER — Other Ambulatory Visit: Payer: Self-pay | Admitting: Family Medicine

## 2020-01-24 DIAGNOSIS — I10 Essential (primary) hypertension: Secondary | ICD-10-CM

## 2020-01-24 DIAGNOSIS — R609 Edema, unspecified: Secondary | ICD-10-CM

## 2020-01-31 ENCOUNTER — Encounter: Payer: Self-pay | Admitting: Physical Therapy

## 2020-01-31 ENCOUNTER — Other Ambulatory Visit: Payer: Self-pay

## 2020-01-31 ENCOUNTER — Ambulatory Visit: Payer: BC Managed Care – PPO | Attending: Neurosurgery | Admitting: Physical Therapy

## 2020-01-31 DIAGNOSIS — G8929 Other chronic pain: Secondary | ICD-10-CM | POA: Diagnosis not present

## 2020-01-31 DIAGNOSIS — M6281 Muscle weakness (generalized): Secondary | ICD-10-CM | POA: Diagnosis not present

## 2020-01-31 DIAGNOSIS — M545 Low back pain, unspecified: Secondary | ICD-10-CM | POA: Insufficient documentation

## 2020-01-31 DIAGNOSIS — R262 Difficulty in walking, not elsewhere classified: Secondary | ICD-10-CM | POA: Insufficient documentation

## 2020-01-31 DIAGNOSIS — R252 Cramp and spasm: Secondary | ICD-10-CM | POA: Insufficient documentation

## 2020-01-31 NOTE — Patient Instructions (Signed)
Access Code: [email or web address] URL: [email or web address] Date: 01/31/2020 Prepared by: Lysle Rubens  Exercises Supine Lower Trunk Rotation - 1 x daily - 7 x weekly - 3 sets - 10 reps - 5 sec hold Supine Posterior Pelvic Tilt - 1 x daily - 7 x weekly - 3 sets - 10 reps - 3 sec hold Supine Bridge - 1 x daily - 7 x weekly - 3 sets - 10 reps Supine Piriformis Stretch with Leg Straight - 1 x daily - 7 x weekly - 3 sets - 3 reps - 10 sec hold Seated Hamstring Stretch - 1 x daily - 7 x weekly - 3 sets - 2 reps - 20-30 sec hold

## 2020-01-31 NOTE — Therapy (Signed)
[facility] [address] [place], [place], [address] Phone: [phone]   Fax:  [phone]  Physical Therapy Evaluation  Patient Details  Name: Lauren Burns MRN: [id number] Date of Birth: 1976-02-11 Referring Provider (PT): Cirigliano   Encounter Date: 01/31/2020   PT End of Session - 01/31/20 1521    Visit Number 1    Date for PT Re-Evaluation 04/01/20    PT Start Time 1440    PT Stop Time 1520    PT Time Calculation (min) 40 min    Activity Tolerance Patient tolerated treatment well    Behavior During Therapy [facility] for tasks assessed/performed           Past Medical History:  Diagnosis Date  . Antiphospholipid antibody syndrome (HCC)   . Anxiety   . Arthritis   . Depression   . Endometriosis   . Endometriosis   . Fatigue   . Fibromyalgia   . GERD (gastroesophageal reflux disease)   . Heart murmur   . Hypertension   . IBS (irritable bowel syndrome)   . IBS (irritable bowel syndrome)   . Obesity   . Pulmonary embolism (HCC) 2004  . Shortness of breath   . Sleep apnea   . Vitamin D deficiency     History reviewed. No pertinent surgical history.  There were no vitals filed for this visit.    Subjective Assessment - 01/31/20 1442    Subjective Pt reports LBP over the past year with progressive worsening over the past few months. Pt reports that she had an MRI recently which was clear. 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 Pt reports burning/sensitivity to touch on lateral L leg; on gabapentin for nerve pain but unsure if this is helping. Pt reports most of the pain is in the L lower lumbar/glute/hip region; denies radiating pain into L leg.    Limitations Sitting;Standing;Walking;House hold activities    How long can you stand comfortably? 10-15 minutes    Diagnostic tests MRI lumbar spine    Patient Stated Goals reduce pain, improve ROM    Currently in Pain? Yes    Pain Score 5     Pain Location Back     Pain Orientation Mid    Pain Descriptors / Indicators Aching;Dull    Pain Type Chronic pain    Pain Onset More than a month ago    Pain Frequency Constant    Aggravating Factors  prolonged standing, prolonged walking, reaching    Pain Relieving Factors sitting (sometimes), moving around              [facility] PT Assessment - 01/31/20 0001      Assessment   Medical Diagnosis LBP    Referring Provider (PT) Cirigliano    Prior Therapy PT for [facility] repair      Precautions   Precautions None      Restrictions   Weight Bearing Restrictions No      Balance Screen   Has the patient fallen in the past 6 months No    Has the patient had a decrease in activity level because of a fear of falling?  No    Is the patient reluctant to leave their home because of a fear of falling?  No      Home Environment   Additional Comments no stairs at home; reports some difficulty with stairs      Prior Function   Level of Independence Independent  Vocation Full time employment    Vocation Requirements owns business; involveds lifting, walking, prolonged standing, and carrying items    Leisure walking      Sensation   Light Touch Appears Intact   mild deficit in sensation on L lateral leg     Posture/Postural Control   Posture/Postural Control Postural limitations    Postural Limitations Rounded Shoulders;Forward head;Increased lumbar lordosis      ROM / Strength   AROM / PROM / Strength AROM;Strength      AROM   Overall AROM Comments lumbar ext/lat flex/rot limited 50%, lumbar flexion limited 25%      Strength   Overall Strength Comments BLE 4/5      Flexibility   Soft Tissue Assessment /Muscle Length yes    Hamstrings tight L>R    Piriformis tight L>R      Palpation   Palpation comment tender to palpation L lumbar paraspinals. Very tender L glute/piriformis/TFL and lateral upper L thigh      Transfers   Five time sit to stand comments  WFL                       Objective measurements completed on examination: See above findings.       [facility] Adult PT Treatment/Exercise - 01/31/20 0001      Exercises   Exercises Lumbar      Lumbar Exercises: Stretches   Active Hamstring Stretch Right;Left;1 rep;30 seconds    Lower Trunk Rotation 5 reps;10 seconds    Pelvic Tilt 10 reps;5 seconds    Piriformis Stretch Right;Left;1 rep;20 seconds    Piriformis Stretch Limitations supine      Lumbar Exercises: Supine   Bridge 10 reps;3 seconds    Bridge Limitations partial ROM                  PT Education - 01/31/20 1521    Education Details Pt educated on POC and HEP    Person(s) Educated Patient    Methods Explanation;Demonstration;Handout    Comprehension Verbalized understanding;Returned demonstration            PT Short Term Goals - 01/31/20 1725      PT SHORT TERM GOAL #1   Title Pt will be I with initial HEP    Time 2    Period Weeks    Status New    Target Date 02/14/20             PT Long Term Goals - 01/31/20 1725      PT LONG TERM GOAL #1   Title Pt will be I with advanced HEP    Time 8    Period Weeks    Status New    Target Date 03/27/20      PT LONG TERM GOAL #2   Title Pt will demo lumbar ROM <25% limited with no increase in LBP    Time 8    Period Weeks    Status New    Target Date 03/27/20      PT LONG TERM GOAL #3   Title Pt will report 50% reduction in LBP    Time 8    Period Weeks    Status New    Target Date 03/27/20      PT LONG TERM GOAL #4   Title Pt will report able to work full workday with </=2/10 LBP    Time 8    Period Weeks  Status New    Target Date 03/27/20                  Plan - 01/31/20 1720    Clinical Impression Statement Pt presents to clinic with reports of chronic LBP worsening over the past year. Pt has radiating pain into L lateral LE but reports MD is unsure if this is coming from spine or if this is a developing neuropathy. Pt  has had MRI which did not reveal significant findings. Pt demos stiffness throughout lumbar spine, mild diminshed sensation lateral L LE, lumbar ROM deficits, very tender to palpation L glute/piriformis/TFL, and LE flexibility deficits L>R. 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 Pt would benefit from skilled PT to address the above impairments.    Personal Factors and Comorbidities Comorbidity 2    Comorbidities on anticoagulants for hx of pulmonary emboli, HTN    Examination-Activity Limitations Bend;Lift;Locomotion Level;Stairs;Stand    Examination-Participation Restrictions Community Activity;Interpersonal Relationship;Occupation    Stability/Clinical Decision Making Stable/Uncomplicated    Clinical Decision Making Low    Rehab Potential Good    PT Frequency 2x / week    PT Duration 8 weeks    PT Treatment/Interventions ADLs/Self Care Home Management;Electrical Stimulation;Iontophoresis 4mg /ml Dexamethasone;Moist Heat;Neuromuscular re-education;Therapeutic exercise;Therapeutic activities;Gait training;Functional mobility training;Stair training;Patient/family education;Manual techniques;Dry needling;Taping    PT Next Visit Plan slowly start gym activities, LE flexibility, manual/modalities as indicated    PT Home Exercise Plan see pt instructions    Consulted and Agree with Plan of Care Patient           Patient will benefit from skilled therapeutic intervention in order to improve the following deficits and impairments:  Abnormal gait, Decreased range of motion, Difficulty walking, Decreased endurance, Increased muscle spasms, Pain, Hypomobility, Impaired flexibility, Decreased strength, Postural dysfunction  Visit Diagnosis: Chronic bilateral low back pain without sciatica  Muscle weakness (generalized)  Difficulty in walking, not elsewhere classified  Cramp and  spasm     Problem List Patient Active Problem List   Diagnosis Date Noted  . Long term current use of anticoagulants with INR goal of 2.0-3.0 05/26/2018  . Other hyperlipidemia 06/01/2017  . Prediabetes 05/11/2017  . Insulin resistance 02/16/2017  . Other fatigue 11/12/2016  . Shortness of breath on exertion 11/12/2016  . Vitamin D deficiency 11/12/2016  . History of pulmonary embolism 11/12/2016  . Depression screening 11/12/2016  . Class 3 obesity with serious comorbidity and body mass index (BMI) of 45.0 to 49.9 in adult 11/12/2016  . Morbid obesity (HCC) 10/10/2015  . Chronic anticoagulation 05/09/2013  . Hyperlipidemia 12/15/2010  . Antiphospholipid antibody syndrome (HCC) 12/15/2010  . Ventricular septal defect (VSD), membranous 12/15/2010  . Essential hypertension 12/15/2010  . Depression 12/11/2010  . ANXIETY 04/09/2006  . GERD 04/09/2006  . PULMONARY EMBOLISM, HX OF 01/12/2006   01/14/2006, PT, DPT Lysle Rubens Tasheena Wambolt 01/31/2020, 5:27 PM  [facility] [address] [place], [place], [place] Phone: [phone]   Fax:  [phone]  Name: Lauren Burns MRN: [name] Date of Birth: [birth year]

## 2020-02-05 ENCOUNTER — Encounter: Payer: Self-pay | Admitting: Physical Therapy

## 2020-02-05 ENCOUNTER — Other Ambulatory Visit: Payer: Self-pay

## 2020-02-05 ENCOUNTER — Ambulatory Visit: Payer: BC Managed Care – PPO | Admitting: Physical Therapy

## 2020-02-05 DIAGNOSIS — M6281 Muscle weakness (generalized): Secondary | ICD-10-CM

## 2020-02-05 DIAGNOSIS — M545 Low back pain, unspecified: Secondary | ICD-10-CM | POA: Diagnosis not present

## 2020-02-05 DIAGNOSIS — G8929 Other chronic pain: Secondary | ICD-10-CM

## 2020-02-05 DIAGNOSIS — R252 Cramp and spasm: Secondary | ICD-10-CM | POA: Diagnosis not present

## 2020-02-05 DIAGNOSIS — R262 Difficulty in walking, not elsewhere classified: Secondary | ICD-10-CM | POA: Diagnosis not present

## 2020-02-05 NOTE — Therapy (Signed)
[facility] [place] [facility] [address] [facility]. [place], [place], [address] Phone: [phone]   Fax:  [phone]  Physical Therapy Treatment  Patient Details  Name: Lauren Burns MRN: [id number] Date of Birth: Aug 11, 1975 Referring Provider (PT): Cirigliano   Encounter Date: 02/05/2020   PT End of Session - 02/05/20 1555    Visit Number 2    Date for PT Re-Evaluation 04/01/20    PT Start Time 1515    PT Stop Time 1558    PT Time Calculation (min) 43 min    Activity Tolerance Patient tolerated treatment well    Behavior During Therapy [facility] for tasks assessed/performed           Past Medical History:  Diagnosis Date   Antiphospholipid antibody syndrome (HCC)    Anxiety    Arthritis    Depression    Endometriosis    Endometriosis    Fatigue    Fibromyalgia    GERD (gastroesophageal reflux disease)    Heart murmur    Hypertension    IBS (irritable bowel syndrome)    IBS (irritable bowel syndrome)    Obesity    Pulmonary embolism (HCC) 2004   Shortness of breath    Sleep apnea    Vitamin D deficiency     History reviewed. No pertinent surgical history.  There were no vitals filed for this visit.   Subjective Assessment - 02/05/20 1515    Subjective " I feel the same" did not work today so her back is feelig okay.    Currently in Pain? Yes    Pain Score 4                              [facility] Adult PT Treatment/Exercise - 02/05/20 0001      Lumbar Exercises: Stretches   Passive Hamstring Stretch 1 rep;Right;Left;60 seconds   PNF hold-relax   Lower Trunk Rotation 5 reps;10 seconds    Pelvic Tilt 10 reps;5 reps   + alt marches   Piriformis Stretch 2 reps;30 seconds;Right;Left    Piriformis Stretch Limitations supine      Lumbar Exercises: Aerobic   Recumbent Bike L3       Lumbar Exercises: Standing   Other Standing Lumbar Exercises Cable walk-outs #20 x5 laps ea side      Lumbar  Exercises: Supine   Pelvic Tilt 10 reps    Bridge with Ball Squeeze 3 seconds;10 reps                    PT Short Term Goals - 01/31/20 1725      PT SHORT TERM GOAL #1   Title Pt will be I with initial HEP    Time 2    Period Weeks    Status New    Target Date 02/14/20             PT Long Term Goals - 01/31/20 1725      PT LONG TERM GOAL #1   Title Pt will be I with advanced HEP    Time 8    Period Weeks    Status New    Target Date 03/27/20      PT LONG TERM GOAL #2   Title Pt will demo lumbar ROM <25% limited with no increase in LBP    Time 8    Period Weeks    Status New  Target Date 03/27/20      PT LONG TERM GOAL #3   Title Pt will report 50% reduction in LBP    Time 8    Period Weeks    Status New    Target Date 03/27/20      PT LONG TERM GOAL #4   Title Pt will report able to work full workday with </=2/10 LBP    Time 8    Period Weeks    Status New    Target Date 03/27/20                 Plan - 02/05/20 1557    Clinical Impression Statement Presents to clinic today with no increased back pain. Patient did not have to work today so she is not experiencing the "nomral" 8/10 low back pain that she usually has after working a full day. She was very tight in Bil hamstrings. Per patient's statment she is performin her HEP once a day. She would benefit from continued PT in order to increase her muscular endurance, increase strength to her deep lumbar stabilizers to support her low back, and increase tissue extensibility of her posterior chain.    PT Treatment/Interventions ADLs/Self Care Home Management;Electrical Stimulation;Iontophoresis 4mg /ml Dexamethasone;Moist Heat;Neuromuscular re-education;Therapeutic exercise;Therapeutic activities;Gait training;Functional mobility training;Stair training;Patient/family education;Manual techniques;Dry needling;Taping    PT Next Visit Plan Progress gym activites, continue with LE flexibility.             Patient will benefit from skilled therapeutic intervention in order to improve the following deficits and impairments:  Abnormal gait, Decreased range of motion, Difficulty walking, Decreased endurance, Increased muscle spasms, Pain, Hypomobility, Impaired flexibility, Decreased strength, Postural dysfunction  Visit Diagnosis: Chronic bilateral low back pain without sciatica  Muscle weakness (generalized)  Difficulty in walking, not elsewhere classified     Problem List Patient Active Problem List   Diagnosis Date Noted   Long term current use of anticoagulants with INR goal of 2.0-3.0 05/26/2018   Other hyperlipidemia 06/01/2017   Prediabetes 05/11/2017   Insulin resistance 02/16/2017   Other fatigue 11/12/2016   Shortness of breath on exertion 11/12/2016   Vitamin D deficiency 11/12/2016   History of pulmonary embolism 11/12/2016   Depression screening 11/12/2016   Class 3 obesity with serious comorbidity and body mass index (BMI) of 45.0 to 49.9 in adult 11/12/2016   Morbid obesity (HCC) 10/10/2015   Chronic anticoagulation 05/09/2013   Hyperlipidemia 12/15/2010   Antiphospholipid antibody syndrome (HCC) 12/15/2010   Ventricular septal defect (VSD), membranous 12/15/2010   Essential hypertension 12/15/2010   Depression 12/11/2010   ANXIETY 04/09/2006   GERD 04/09/2006   PULMONARY EMBOLISM, HX OF 01/12/2006    01/14/2006, SPTA 02/05/2020, 4:03 PM  [facility] [place] [facility] [address] [place], [place], [place] Phone: [phone]   Fax:  [phone]  Name: Lauren Burns MRN: [name] Date of Birth: [birth year]

## 2020-02-07 ENCOUNTER — Encounter: Payer: Self-pay | Admitting: Physical Therapy

## 2020-02-07 ENCOUNTER — Other Ambulatory Visit: Payer: Self-pay

## 2020-02-07 ENCOUNTER — Ambulatory Visit: Payer: BC Managed Care – PPO | Admitting: Physical Therapy

## 2020-02-07 DIAGNOSIS — M6281 Muscle weakness (generalized): Secondary | ICD-10-CM | POA: Diagnosis not present

## 2020-02-07 DIAGNOSIS — G8929 Other chronic pain: Secondary | ICD-10-CM

## 2020-02-07 DIAGNOSIS — M545 Low back pain, unspecified: Secondary | ICD-10-CM | POA: Diagnosis not present

## 2020-02-07 DIAGNOSIS — R262 Difficulty in walking, not elsewhere classified: Secondary | ICD-10-CM

## 2020-02-07 DIAGNOSIS — R252 Cramp and spasm: Secondary | ICD-10-CM | POA: Diagnosis not present

## 2020-02-07 NOTE — Therapy (Signed)
[facility] [place] [facility] [address] [place], [place], [address] Phone: [phone]   Fax:  [phone] During this treatment session, the therapist was present, participating in and directing the treatment.  Physical Therapy Treatment  Patient Details  Name: Lauren Burns MRN: [id number] Date of Birth: [birth year] Referring Provider (PT): Cirigliano   Encounter Date: 02/07/2020   PT End of Session - 02/07/20 1613    Visit Number 3    Date for PT Re-Evaluation 04/01/20    PT Start Time 1532    PT Stop Time 1615    PT Time Calculation (min) 43 min    Activity Tolerance Patient tolerated treatment well    Behavior During Therapy [facility] for tasks assessed/performed           Past Medical History:  Diagnosis Date  . Antiphospholipid antibody syndrome (HCC)   . Anxiety   . Arthritis   . Depression   . Endometriosis   . Endometriosis   . Fatigue   . Fibromyalgia   . GERD (gastroesophageal reflux disease)   . Heart murmur   . Hypertension   . IBS (irritable bowel syndrome)   . IBS (irritable bowel syndrome)   . Obesity   . Pulmonary embolism (HCC) 2004  . Shortness of breath   . Sleep apnea   . Vitamin D deficiency     History reviewed. No pertinent surgical history.  There were no vitals filed for this visit.   Subjective Assessment - 02/07/20 1533    Subjective Feels good today "just a dull ache"    Currently in Pain? No/denies                             [facility] Adult PT Treatment/Exercise - 02/07/20 0001      Lumbar Exercises: Stretches   Lower Trunk Rotation 5 reps;10 seconds    Pelvic Tilt 10 reps;5 seconds      Lumbar Exercises: Aerobic   UBE (Upper Arm Bike) L2 2 mins ea     Recumbent Bike L3      Lumbar Exercises: Machines for Strengthening   Cybex Knee Extension #15 2x10    Cybex Knee Flexion #35 2x10      Lumbar Exercises: Standing   Other Standing Lumbar Exercises paloff press  2x10 ea, cable marches #10 2x10    Other Standing Lumbar Exercises Hip abd/ext 2x5 #2.5      Lumbar Exercises: Seated   Other Seated Lumbar Exercises seated ISO physioball 10x3", seated ball squeeze 10x3"      Lumbar Exercises: Supine   Bridge 10 reps   K2C physioball                   PT Short Term Goals - 01/31/20 1725      PT SHORT TERM GOAL #1   Title Pt will be I with initial HEP    Time 2    Period Weeks    Status New    Target Date 02/14/20             PT Long Term Goals - 01/31/20 1725      PT LONG TERM GOAL #1   Title Pt will be I with advanced HEP    Time 8    Period Weeks    Status New    Target Date 03/27/20      PT LONG TERM GOAL #2  Title Pt will demo lumbar ROM <25% limited with no increase in LBP    Time 8    Period Weeks    Status New    Target Date 03/27/20      PT LONG TERM GOAL #3   Title Pt will report 50% reduction in LBP    Time 8    Period Weeks    Status New    Target Date 03/27/20      PT LONG TERM GOAL #4   Title Pt will report able to work full workday with </=2/10 LBP    Time 8    Period Weeks    Status New    Target Date 03/27/20                 Plan - 02/07/20 1613    Clinical Impression Statement Patient presents to clinic today with no back pain just a dull ache. She even worked all day today and did not experience an increase in her symptoms. We progressed her exercises today and added in some machine exercises to strengthen her low back and LE's. She was able to complete all sets/reps with no increase in pain or symptoms just some muscular fatigue. Her limiting factors are lumbar stability and overall decreased muscular endurance.    PT Treatment/Interventions ADLs/Self Care Home Management;Electrical Stimulation;Iontophoresis 4mg /ml Dexamethasone;Moist Heat;Neuromuscular re-education;Therapeutic exercise;Therapeutic activities;Gait training;Functional mobility training;Stair training;Patient/family  education;Manual techniques;Dry needling;Taping    PT Next Visit Plan Progress gym activites, continue with LE flexibility.           Patient will benefit from skilled therapeutic intervention in order to improve the following deficits and impairments:  Abnormal gait, Decreased range of motion, Difficulty walking, Decreased endurance, Increased muscle spasms, Pain, Hypomobility, Impaired flexibility, Decreased strength, Postural dysfunction  Visit Diagnosis: Chronic bilateral low back pain without sciatica  Muscle weakness (generalized)  Difficulty in walking, not elsewhere classified  Cramp and spasm     Problem List Patient Active Problem List   Diagnosis Date Noted  . Long term current use of anticoagulants with INR goal of 2.0-3.0 05/26/2018  . Other hyperlipidemia 06/01/2017  . Prediabetes 05/11/2017  . Insulin resistance 02/16/2017  . Other fatigue 11/12/2016  . Shortness of breath on exertion 11/12/2016  . Vitamin D deficiency 11/12/2016  . History of pulmonary embolism 11/12/2016  . Depression screening 11/12/2016  . Class 3 obesity with serious comorbidity and body mass index (BMI) of 45.0 to 49.9 in adult 11/12/2016  . Morbid obesity (HCC) 10/10/2015  . Chronic anticoagulation 05/09/2013  . Hyperlipidemia 12/15/2010  . Antiphospholipid antibody syndrome (HCC) 12/15/2010  . Ventricular septal defect (VSD), membranous 12/15/2010  . Essential hypertension 12/15/2010  . Depression 12/11/2010  . ANXIETY 04/09/2006  . GERD 04/09/2006  . PULMONARY EMBOLISM, HX OF 01/12/2006    01/14/2006, SPTA 02/07/2020, 4:21 PM  [facility] [address] [place], [place], [place] Phone: [phone]   Fax:  [phone]  Name: Lauren Burns MRN: Lauren Burns Date of Birth: [birth year]

## 2020-02-09 ENCOUNTER — Other Ambulatory Visit: Payer: Self-pay | Admitting: Family Medicine

## 2020-02-09 DIAGNOSIS — M5441 Lumbago with sciatica, right side: Secondary | ICD-10-CM

## 2020-02-09 DIAGNOSIS — G8929 Other chronic pain: Secondary | ICD-10-CM

## 2020-02-09 NOTE — Telephone Encounter (Signed)
Last OV 08/04/19 Last fill 01/04/20  #60/0

## 2020-02-12 ENCOUNTER — Ambulatory Visit: Payer: BC Managed Care – PPO | Admitting: Physical Therapy

## 2020-02-12 ENCOUNTER — Encounter: Payer: Self-pay | Admitting: Physical Therapy

## 2020-02-12 ENCOUNTER — Other Ambulatory Visit: Payer: Self-pay

## 2020-02-12 DIAGNOSIS — R252 Cramp and spasm: Secondary | ICD-10-CM | POA: Diagnosis not present

## 2020-02-12 DIAGNOSIS — M6281 Muscle weakness (generalized): Secondary | ICD-10-CM

## 2020-02-12 DIAGNOSIS — M545 Low back pain, unspecified: Secondary | ICD-10-CM | POA: Diagnosis not present

## 2020-02-12 DIAGNOSIS — R262 Difficulty in walking, not elsewhere classified: Secondary | ICD-10-CM | POA: Diagnosis not present

## 2020-02-12 DIAGNOSIS — G8929 Other chronic pain: Secondary | ICD-10-CM | POA: Diagnosis not present

## 2020-02-12 NOTE — Therapy (Signed)
[facility] [place] [facility] [address] [place], [place], [address] Phone: [phone]   Fax:  [phone]  Physical Therapy Treatment  Patient Details  Name: Lauren Burns MRN: [id number] Date of Birth: 1976-02-20 Referring Provider (PT): Cirigliano   Encounter Date: 02/12/2020   PT End of Session - 02/12/20 1610    Visit Number 4    Date for PT Re-Evaluation 04/01/20    PT Start Time 1531    PT Stop Time 1620    PT Time Calculation (min) 49 min    Activity Tolerance Patient tolerated treatment well    Behavior During Therapy [facility] for tasks assessed/performed           Past Medical History:  Diagnosis Date  . Antiphospholipid antibody syndrome (HCC)   . Anxiety   . Arthritis   . Depression   . Endometriosis   . Endometriosis   . Fatigue   . Fibromyalgia   . GERD (gastroesophageal reflux disease)   . Heart murmur   . Hypertension   . IBS (irritable bowel syndrome)   . IBS (irritable bowel syndrome)   . Obesity   . Pulmonary embolism (HCC) 2004  . Shortness of breath   . Sleep apnea   . Vitamin D deficiency     History reviewed. No pertinent surgical history.  There were no vitals filed for this visit.   Subjective Assessment - 02/12/20 1535    Subjective Pt reports that she is especially sore after overdoing it this past weekend    Currently in Pain? Yes    Pain Score 7     Pain Location Back                             [facility] Adult PT Treatment/Exercise - 02/12/20 0001      Lumbar Exercises: Aerobic   UBE (Upper Arm Bike) L2 2 mins ea     Nustep L5 x 6 min      Lumbar Exercises: Machines for Strengthening   Cybex Knee Extension #15 2x10    Cybex Knee Flexion #35 2x10    Other Lumbar Machine Exercise rows and lats 25# 2x10      Lumbar Exercises: Standing   Other Standing Lumbar Exercises resisted gait 40# x4 each direction; shoulder ext 10# 2x10; AR press 1x10 15#       Lumbar  Exercises: Seated   Other Seated Lumbar Exercises seated lumbar flexion into exercise ball iso abs x10 3 sec hold      Modalities   Modalities Electrical Stimulation;Moist Heat      Moist Heat Therapy   Number Minutes Moist Heat 10 Minutes    Moist Heat Location Lumbar Spine      Electrical Stimulation   Electrical Stimulation Location Lumbar    Electrical Stimulation Action IFC    Electrical Stimulation Parameters supine    Electrical Stimulation Goals Pain                    PT Short Term Goals - 02/12/20 1612      PT SHORT TERM GOAL #1   Title Pt will be I with initial HEP    Time 2    Period Weeks    Status Achieved    Target Date 02/14/20             PT Long Term Goals - 02/12/20 1612  PT LONG TERM GOAL #1   Title Pt will be I with advanced HEP    Time 8    Period Weeks    Status On-going      PT LONG TERM GOAL #2   Title Pt will demo lumbar ROM <25% limited with no increase in LBP    Time 8    Period Weeks    Status On-going      PT LONG TERM GOAL #3   Title Pt will report 50% reduction in LBP    Time 8    Period Weeks    Status On-going      PT LONG TERM GOAL #4   Title Pt will report able to work full workday with </=2/10 LBP    Time 8    Period Weeks    Status On-going                 Plan - 02/12/20 1611    Clinical Impression Statement Pt presents to clinic with increased LBP d/t weekend on feet at work. Pt did well with ex's today with minimal increase in LBP during exercise. Pt reporting some soreness by end of sesison; heat and estim for pain relief. Pt required cues for posture/form for standing shoulder extension.    PT Treatment/Interventions ADLs/Self Care Home Management;Electrical Stimulation;Iontophoresis 4mg /ml Dexamethasone;Moist Heat;Neuromuscular re-education;Therapeutic exercise;Therapeutic activities;Gait training;Functional mobility training;Stair training;Patient/family education;Manual techniques;Dry  needling;Taping    PT Next Visit Plan Progress gym activites, continue with LE flexibility.    Consulted and Agree with Plan of Care Patient           Patient will benefit from skilled therapeutic intervention in order to improve the following deficits and impairments:  Abnormal gait, Decreased range of motion, Difficulty walking, Decreased endurance, Increased muscle spasms, Pain, Hypomobility, Impaired flexibility, Decreased strength, Postural dysfunction  Visit Diagnosis: Chronic bilateral low back pain without sciatica  Muscle weakness (generalized)  Difficulty in walking, not elsewhere classified  Cramp and spasm     Problem List Patient Active Problem List   Diagnosis Date Noted  . Long term current use of anticoagulants with INR goal of 2.0-3.0 05/26/2018  . Other hyperlipidemia 06/01/2017  . Prediabetes 05/11/2017  . Insulin resistance 02/16/2017  . Other fatigue 11/12/2016  . Shortness of breath on exertion 11/12/2016  . Vitamin D deficiency 11/12/2016  . History of pulmonary embolism 11/12/2016  . Depression screening 11/12/2016  . Class 3 obesity with serious comorbidity and body mass index (BMI) of 45.0 to 49.9 in adult 11/12/2016  . Morbid obesity (HCC) 10/10/2015  . Chronic anticoagulation 05/09/2013  . Hyperlipidemia 12/15/2010  . Antiphospholipid antibody syndrome (HCC) 12/15/2010  . Ventricular septal defect (VSD), membranous 12/15/2010  . Essential hypertension 12/15/2010  . Depression 12/11/2010  . ANXIETY 04/09/2006  . GERD 04/09/2006  . PULMONARY EMBOLISM, HX OF 01/12/2006   01/14/2006, PT, DPT Lysle Rubens [place] Leer 02/12/2020, 4:13 PM  [facility] [place] [facility] [address] [place], [place], [place] Phone: [phone]   Fax:  [phone]  Name: Lauren Burns MRN: [name] Date of Birth: [birth year]

## 2020-02-13 ENCOUNTER — Other Ambulatory Visit: Payer: Self-pay | Admitting: Family Medicine

## 2020-02-13 DIAGNOSIS — M5441 Lumbago with sciatica, right side: Secondary | ICD-10-CM

## 2020-02-13 DIAGNOSIS — G8929 Other chronic pain: Secondary | ICD-10-CM

## 2020-02-13 NOTE — Telephone Encounter (Signed)
Please see message and advise.  Thank you. Last OV 08/04/19 Last fill 01/04/20  #60/0

## 2020-02-14 ENCOUNTER — Ambulatory Visit: Payer: BC Managed Care – PPO | Admitting: Physical Therapy

## 2020-02-15 NOTE — Telephone Encounter (Signed)
I received 2 separate refill requests for the same med. I refilled/approved the first and denied the 2nd (duplicate). pts med list shows tramadol 50mg  60 tabs sent yesterday 02/14/20

## 2020-02-15 NOTE — Telephone Encounter (Signed)
Per patient,  I usually take one in the morning before work and one at bedtime. I'm doing physical therapy twice a week and it seems to be helping a little but makes me extremely sore. We are going into our busy Holiday season at work, and I'm on my feet most of the day lifting more than I should but we are also short staffed so I do what I have to do. I have about 6 or 8 pills left so I am definitely not taking too many. Can I ask why my refill was denied? The MRI didn't show anything wrong with my spine so it's most likely in the muscle or nerve but it's still hurting everyday.

## 2020-02-20 ENCOUNTER — Ambulatory Visit: Payer: BC Managed Care – PPO | Admitting: Physical Therapy

## 2020-02-26 ENCOUNTER — Other Ambulatory Visit: Payer: Self-pay

## 2020-02-26 ENCOUNTER — Ambulatory Visit: Payer: BC Managed Care – PPO | Admitting: Physical Therapy

## 2020-02-26 ENCOUNTER — Encounter: Payer: Self-pay | Admitting: Physical Therapy

## 2020-02-26 DIAGNOSIS — G8929 Other chronic pain: Secondary | ICD-10-CM | POA: Diagnosis not present

## 2020-02-26 DIAGNOSIS — R262 Difficulty in walking, not elsewhere classified: Secondary | ICD-10-CM | POA: Diagnosis not present

## 2020-02-26 DIAGNOSIS — R252 Cramp and spasm: Secondary | ICD-10-CM | POA: Diagnosis not present

## 2020-02-26 DIAGNOSIS — M6281 Muscle weakness (generalized): Secondary | ICD-10-CM | POA: Diagnosis not present

## 2020-02-26 DIAGNOSIS — M545 Low back pain, unspecified: Secondary | ICD-10-CM | POA: Diagnosis not present

## 2020-02-26 NOTE — Therapy (Addendum)
Lauren Burns. [place], [place], [address] Phone: [phone]   Fax:  [phone]  Physical Therapy Treatment  Patient Details  Name: VICTORIAH WILDS MRN: [id number] Date of Birth: [birth year] Referring Provider (PT): Cirigliano   Encounter Date: 02/26/2020   PT End of Session - 02/26/20 1602    Visit Number 5    Date for PT Re-Evaluation 04/01/20    PT Start Time 9518    PT Stop Time 1615    PT Time Calculation (min) 45 min    Activity Tolerance Patient tolerated treatment well    Behavior During Therapy [facility] for tasks assessed/performed           Past Medical History:  Diagnosis Date  . Antiphospholipid antibody syndrome ([place])   . Anxiety   . Arthritis   . Depression   . Endometriosis   . Endometriosis   . Fatigue   . Fibromyalgia   . GERD (gastroesophageal reflux disease)   . Heart murmur   . Hypertension   . IBS (irritable bowel syndrome)   . IBS (irritable bowel syndrome)   . Obesity   . Pulmonary embolism ([place]) 2004  . Shortness of breath   . Sleep apnea   . Vitamin D deficiency     History reviewed. No pertinent surgical history.  There were no vitals filed for this visit.   Subjective Assessment - 02/26/20 1530    Subjective Pt reports feeling sore today in LB    Currently in Pain? Yes    Pain Score 5     Pain Location Back                             [facility] Adult PT Treatment/Exercise - 02/26/20 0001      Lumbar Exercises: Aerobic   UBE (Upper Arm Bike) L2 3 fwd/2bkwd    Nustep L5 x 6 min      Lumbar Exercises: Machines for Strengthening   Cybex Knee Extension #15 2x10    Cybex Knee Flexion #35 2x10    Other Lumbar Machine Exercise rows and lats 25# 2x10      Lumbar Exercises: Supine   Other Supine Lumbar Exercises PPT, dktc, small bridges on exercise ball      Electrical Stimulation   Electrical Stimulation Location Lumbar    Electrical Stimulation  Action IFC    Electrical Stimulation Parameters supine    Electrical Stimulation Goals Pain                    PT Short Term Goals - 02/12/20 1612      PT SHORT TERM GOAL #1   Title Pt will be I with initial HEP    Time 2    Period Weeks    Status Achieved    Target Date 02/14/20             PT Long Term Goals - 02/12/20 1612      PT LONG TERM GOAL #1   Title Pt will be I with advanced HEP    Time 8    Period Weeks    Status On-going      PT LONG TERM GOAL #2   Title Pt will demo lumbar ROM <25% limited with no increase in LBP    Time 8    Period Weeks    Status On-going      PT LONG TERM  GOAL #3   Title Pt will report 50% reduction in LBP    Time 8    Period Weeks    Status On-going      PT LONG TERM GOAL #4   Title Pt will report able to work full workday with </=2/10 LBP    Time 8    Period Weeks    Status On-going                 Plan - 02/26/20 1602    Clinical Impression Statement Pt tolerated machine interventions well with no increase in LBP. Pt reports mild pain/soreness with PPT and supine bridges; relieved with lower lumbar stretching and LTR. Continue to progress core stab and LE flexibility ex's.    PT Treatment/Interventions ADLs/Self Care Home Management;Electrical Stimulation;Iontophoresis 4mg /ml Dexamethasone;Moist Heat;Neuromuscular re-education;Therapeutic exercise;Therapeutic activities;Gait training;Functional mobility training;Stair training;Patient/family education;Manual techniques;Dry needling;Taping    PT Next Visit Plan Progress gym activites, continue with LE flexibility.    Consulted and Agree with Plan of Care Patient           Patient will benefit from skilled therapeutic intervention in order to improve the following deficits and impairments:  Abnormal gait, Decreased range of motion, Difficulty walking, Decreased endurance, Increased muscle spasms, Pain, Hypomobility, Impaired flexibility, Decreased strength,  Postural dysfunction  Visit Diagnosis: Chronic bilateral low back pain without sciatica  Muscle weakness (generalized)  Difficulty in walking, not elsewhere classified  Cramp and spasm     Problem List Patient Active Problem List   Diagnosis Date Noted  . Long term current use of anticoagulants with INR goal of 2.0-3.0 05/26/2018  . Other hyperlipidemia 06/01/2017  . Prediabetes 05/11/2017  . Insulin resistance 02/16/2017  . Other fatigue 11/12/2016  . Shortness of breath on exertion 11/12/2016  . Vitamin D deficiency 11/12/2016  . History of pulmonary embolism 11/12/2016  . Depression screening 11/12/2016  . Class 3 obesity with serious comorbidity and body mass index (BMI) of 45.0 to 49.9 in adult 11/12/2016  . Morbid obesity ([place]) 10/10/2015  . Chronic anticoagulation 05/09/2013  . Hyperlipidemia 12/15/2010  . Antiphospholipid antibody syndrome ([place]) 12/15/2010  . Ventricular septal defect (VSD), membranous 12/15/2010  . Essential hypertension 12/15/2010  . Depression 12/11/2010  . ANXIETY 04/09/2006  . GERD 04/09/2006  . PULMONARY EMBOLISM, HX OF 01/12/2006   PHYSICAL THERAPY DISCHARGE SUMMARY   Plan: Patient agrees to discharge.  Patient goals were not met. Patient is being discharged due to not returning since the last visit.  ?????     Amador Cunas, PT, DPT Donald Prose Xaivier Malay 02/26/2020, 4:04 PM  [facility]. [place], [place], [address] Phone: [phone]   Fax:  [phone]  Name: KIMYATTA LECY MRN: [id number] Date of Birth: 21-Mar-1976

## 2020-02-27 ENCOUNTER — Other Ambulatory Visit: Payer: Self-pay | Admitting: Family Medicine

## 2020-02-27 DIAGNOSIS — R609 Edema, unspecified: Secondary | ICD-10-CM

## 2020-02-27 DIAGNOSIS — I10 Essential (primary) hypertension: Secondary | ICD-10-CM

## 2020-02-27 NOTE — Telephone Encounter (Signed)
Last OV 08/04/19 Last fill 01/24/20  #30/0

## 2020-02-28 ENCOUNTER — Ambulatory Visit: Payer: BC Managed Care – PPO | Admitting: Physical Therapy

## 2020-03-04 ENCOUNTER — Ambulatory Visit: Payer: BC Managed Care – PPO | Admitting: Physical Therapy

## 2020-03-11 ENCOUNTER — Ambulatory Visit: Payer: BC Managed Care – PPO | Admitting: Physical Therapy

## 2020-03-25 ENCOUNTER — Ambulatory Visit: Payer: BC Managed Care – PPO | Admitting: Physical Therapy

## 2020-03-25 ENCOUNTER — Other Ambulatory Visit: Payer: Self-pay | Admitting: Family Medicine

## 2020-03-25 DIAGNOSIS — G8929 Other chronic pain: Secondary | ICD-10-CM

## 2020-03-25 DIAGNOSIS — R609 Edema, unspecified: Secondary | ICD-10-CM

## 2020-03-25 DIAGNOSIS — M5442 Lumbago with sciatica, left side: Secondary | ICD-10-CM

## 2020-03-25 DIAGNOSIS — I10 Essential (primary) hypertension: Secondary | ICD-10-CM

## 2020-03-25 MED ORDER — FUROSEMIDE 20 MG PO TABS: 20.0000 mg | ORAL_TABLET | Freq: Every day | ORAL | 1 refills | Status: DC

## 2020-03-25 NOTE — Telephone Encounter (Signed)
Refill request for: Tramadol 50 mg LR 02/14/20, #60, 0 rf LOV 08/04/19   FOV none scheduled.   Please advise.  Thanks. Dm/cma

## 2020-03-25 NOTE — Telephone Encounter (Signed)
Last fill 02/14/20 #60/0 Last OV 08/04/19

## 2020-03-28 ENCOUNTER — Other Ambulatory Visit: Payer: Self-pay | Admitting: Family Medicine

## 2020-03-28 DIAGNOSIS — G8929 Other chronic pain: Secondary | ICD-10-CM

## 2020-03-28 DIAGNOSIS — M5442 Lumbago with sciatica, left side: Secondary | ICD-10-CM

## 2020-03-28 MED ORDER — TRAMADOL HCL 50 MG PO TABS
50.0000 mg | ORAL_TABLET | Freq: Four times a day (QID) | ORAL | 0 refills | Status: DC
Start: 2020-03-28 — End: 2020-06-26

## 2020-03-28 MED ORDER — TRAMADOL HCL 50 MG PO TABS: 50.0000 mg | ORAL_TABLET | Freq: Four times a day (QID) | ORAL | 0 refills | Status: DC

## 2020-04-02 ENCOUNTER — Encounter: Payer: Self-pay | Admitting: Family Medicine

## 2020-04-03 ENCOUNTER — Other Ambulatory Visit: Payer: Self-pay | Admitting: Family Medicine

## 2020-04-03 DIAGNOSIS — G5790 Unspecified mononeuropathy of unspecified lower limb: Secondary | ICD-10-CM

## 2020-04-03 DIAGNOSIS — M5416 Radiculopathy, lumbar region: Secondary | ICD-10-CM

## 2020-05-06 ENCOUNTER — Encounter: Payer: Self-pay | Admitting: Family Medicine

## 2020-05-06 DIAGNOSIS — G8929 Other chronic pain: Secondary | ICD-10-CM

## 2020-05-06 DIAGNOSIS — M5442 Lumbago with sciatica, left side: Secondary | ICD-10-CM

## 2020-05-07 MED ORDER — GABAPENTIN 300 MG PO CAPS: 300.0000 mg | ORAL_CAPSULE | Freq: Two times a day (BID) | ORAL | 3 refills | Status: DC

## 2020-05-29 ENCOUNTER — Other Ambulatory Visit: Payer: Self-pay

## 2020-05-29 ENCOUNTER — Encounter: Payer: Self-pay | Admitting: Family Medicine

## 2020-05-29 DIAGNOSIS — R609 Edema, unspecified: Secondary | ICD-10-CM

## 2020-05-29 DIAGNOSIS — I10 Essential (primary) hypertension: Secondary | ICD-10-CM

## 2020-05-29 MED ORDER — FUROSEMIDE 20 MG PO TABS: 20.0000 mg | ORAL_TABLET | Freq: Every day | ORAL | 1 refills | Status: DC

## 2020-06-11 ENCOUNTER — Other Ambulatory Visit: Payer: Self-pay

## 2020-06-11 ENCOUNTER — Ambulatory Visit: Payer: BC Managed Care – PPO | Admitting: Diagnostic Neuroimaging

## 2020-06-11 ENCOUNTER — Encounter: Payer: Self-pay | Admitting: Diagnostic Neuroimaging

## 2020-06-11 VITALS — BP 148/87 | HR 90 | Ht 68.5 in | Wt 348.8 lb

## 2020-06-11 DIAGNOSIS — G5712 Meralgia paresthetica, left lower limb: Secondary | ICD-10-CM

## 2020-06-11 NOTE — Patient Instructions (Signed)
LEFT THIGH PAIN - likely meralgia paresthetica; advised on nutrition, exercise, weight loss  CHRONIC FATIGUE / PAIN ISSUES - continue gabapentin 600mg  three times a day + tramadol as needed

## 2020-06-11 NOTE — Progress Notes (Signed)
[facility]  PATIENT: Lauren Burns DOB: 1976-01-02  REFERRING CLINICIAN: Overton Mam, DO HISTORY FROM: patient  REASON FOR VISIT: new consult    HISTORICAL  CHIEF COMPLAINT:  Chief Complaint  Patient presents with  . Neuropathy of LE, pain, paresthesias    Rm 7 New Pt "left leg issues > 1 year, getting worse with throbbing pain, electrical pulses; gabapentin has helped some"     HISTORY OF PRESENT ILLNESS:   45 year old female here for evaluation of left leg pain.  Patient has had chronic pain and malaise sensations for over 10 years.  Around 2 years ago she started to have increasing pain in her left anterolateral thigh which has progressed to electrical and throbbing sensations and sensitivity to light touch.  She was having some low back pain issues, went to neurosurgery, had MRI of the lumbar spine which was unremarkable.  She underwent physical therapy evaluation which slightly helped.  She has been using gabapentin and tramadol with out significant relief.  Patient has had issues with obesity for many years, previously was approved for bariatric surgery, but then insurance issues prevented her from getting this.  In 2019 she will underwent medical weight management treatments and clinical evaluation was able to lose 90 pounds at that time (330 to 240 pounds).  She was able to maintain this for about 1 year.  In the last 2 years she has had more stress and pain, and weight has increased up to 350 pounds.  Patient has long history of depression, has been on medication for many years.  Patient had multiple pulmonary embolisms in 2005, diagnosed with antiphospholipid antibody syndrome and has been on anticoagulation since that time.    REVIEW OF SYSTEMS: Full 14 system review of systems performed and negative with exception of: As per HPI.   ALLERGIES: No Known Allergies  HOME MEDICATIONS: Outpatient Medications Prior to Visit  Medication  Sig Dispense Refill  . DULoxetine (CYMBALTA) 30 MG capsule Take 1 capsule (30 mg total) by mouth daily. 90 capsule 3  . furosemide (LASIX) 20 MG tablet Take 1 tablet (20 mg total) by mouth daily. 30 tablet 1  . gabapentin (NEURONTIN) 300 MG capsule Take 1 capsule (300 mg total) by mouth 2 (two) times daily. 180 capsule 3  . losartan-hydrochlorothiazide (HYZAAR) 100-25 MG tablet Take 1 tablet by mouth daily. 90 tablet 3  . traMADol (ULTRAM) 50 MG tablet Take 1 tablet (50 mg total) by mouth 4 (four) times daily. 60 tablet 0  . verapamil (CALAN-SR) 180 MG CR tablet Take 1 tablet (180 mg total) by mouth at bedtime. 90 tablet 1  . warfarin (COUMADIN) 5 MG tablet Take 1 tablet (5 mg total) by mouth daily. 90 tablet 3  . predniSONE (DELTASONE) 20 MG tablet 3 tabs po x 3 days, then 2 tabs po x 3 days, then 1 tab po x 3 days, then 1/2 tab po x 3 days 20 tablet 0  . traMADol (ULTRAM) 50 MG tablet Take 1 tablet (50 mg total) by mouth 4 (four) times daily. 60 tablet 0   No facility-administered medications prior to visit.    PAST MEDICAL HISTORY: Past Medical History:  Diagnosis Date  . Antiphospholipid antibody syndrome (HCC)   . Anxiety   . Arthritis   . Depression   . Endometriosis   . Endometriosis   . Fatigue   . Fibromyalgia   . GERD (gastroesophageal reflux disease)   . Heart murmur   .  Hypertension   . IBS (irritable bowel syndrome)   . IBS (irritable bowel syndrome)   . Obesity   . Pulmonary embolism (HCC) 2004  . Shortness of breath   . Sleep apnea   . Vitamin D deficiency     PAST SURGICAL HISTORY: No past surgical history on file.  FAMILY HISTORY: Family History  Problem Relation Age of Onset  . Hyperlipidemia Mother   . Depression Mother   . Anxiety disorder Mother   . Obesity Mother   . Heart disease Father   . Alcohol abuse Father   . Hypertension Father   . Cancer Father   . Alcoholism Father   . Obesity Father   . Diabetes Mellitus I Brother     SOCIAL  HISTORY: Social History   Socioeconomic History  . Marital status: Married    Spouse name: Barbara Cower  . Number of children: 1  . Years of education: Not on file  . Highest education level: Bachelor's degree (e.g., BA, AB, BS)  Occupational History  . Occupation: Research scientist (life sciences)  Tobacco Use  . Smoking status: Former Smoker    Quit date: 12/17/2008    Years since quitting: 11.4  . Smokeless tobacco: Never Used  Substance and Sexual Activity  . Alcohol use: No  . Drug use: No  . Sexual activity: Not on file  Other Topics Concern  . Not on file  Social History Narrative   Lives with husband, son   Social Determinants of Health   Financial Resource Strain: Not on file  Food Insecurity: Not on file  Transportation Needs: Not on file  Physical Activity: Not on file  Stress: Not on file  Social Connections: Not on file  Intimate Partner Violence: Not on file     PHYSICAL EXAM  GENERAL EXAM/CONSTITUTIONAL: Vitals:  Vitals:   06/11/20 1438  BP: (!) 148/87  Pulse: 90  Weight: (!) 348 lb 12.8 oz (158.2 kg)  Height: 5' 8.5" (1.74 m)   Body mass index is 52.26 kg/m. Wt Readings from Last 8 Encounters:  06/11/20 (!) 348 lb 12.8 oz (158.2 kg)  08/04/19 (!) 337 lb 6.4 oz (153 kg)  07/21/18 270 lb 3.2 oz (122.6 kg)  04/28/18 249 lb (112.9 kg)  02/02/18 231 lb (104.8 kg)  01/18/18 235 lb (106.6 kg)  01/12/18 238 lb (108 kg)  12/22/17 233 lb (105.7 kg)    Patient is in no distress; well developed, nourished and groomed; neck is supple  CARDIOVASCULAR:  Examination of carotid arteries is normal; no carotid bruits  Regular rate and rhythm, no murmurs  Examination of peripheral vascular system by observation and palpation is normal  EYES:  Ophthalmoscopic exam of optic discs and posterior segments is normal; no papilledema or hemorrhages No exam data present  MUSCULOSKELETAL:  Gait, strength, tone, movements noted in Neurologic exam below  NEUROLOGIC: MENTAL  STATUS:  No flowsheet data found.  awake, alert, oriented to person, place and time  recent and remote memory intact  normal attention and concentration  language fluent, comprehension intact, naming intact  fund of knowledge appropriate  CRANIAL NERVE:   2nd - no papilledema on fundoscopic exam  2nd, 3rd, 4th, 6th - pupils equal and reactive to light, visual fields full to confrontation, extraocular muscles intact, no nystagmus  5th - facial sensation symmetric  7th - facial strength symmetric  8th - hearing intact  9th - palate elevates symmetrically, uvula midline  11th - shoulder shrug symmetric  12th - tongue  protrusion midline  MOTOR:   normal bulk and tone, full strength in the BUE, BLE  SENSORY:   normal and symmetric to light touch, temperature, vibration  COORDINATION:   finger-nose-finger, fine finger movements normal  REFLEXES:   deep tendon reflexes TRACE and symmetric  GAIT/STATION:   narrow based gait     DIAGNOSTIC DATA (LABS, IMAGING, TESTING) - I reviewed patient records, labs, notes, testing and imaging myself where available.  Lab Results  Component Value Date   WBC 5.9 11/12/2016   HGB 13.7 11/12/2016   HCT 41.2 11/12/2016   MCV 89 11/12/2016   PLT 297 11/12/2016      Component Value Date/Time   NA 141 11/30/2017 1153   K 3.9 11/30/2017 1153   CL 96 11/30/2017 1153   CO2 23 11/30/2017 1153   GLUCOSE 43 (L) 11/30/2017 1153   GLUCOSE 97 05/21/2016 1026   BUN 16 11/30/2017 1153   CREATININE 0.87 11/30/2017 1153   CREATININE 0.80 05/21/2016 1026   CALCIUM 9.2 11/30/2017 1153   PROT 6.9 11/30/2017 1153   ALBUMIN 4.4 11/30/2017 1153   AST 16 11/30/2017 1153   ALT 13 11/30/2017 1153   ALKPHOS 78 11/30/2017 1153   BILITOT 0.6 11/30/2017 1153   GFRNONAA 82 11/30/2017 1153   GFRAA 94 11/30/2017 1153   Lab Results  Component Value Date   CHOL 205 (H) 11/30/2017   HDL 57 11/30/2017   LDLCALC 133 (H) 11/30/2017   TRIG  73 11/30/2017   CHOLHDL 3.9 05/21/2016   Lab Results  Component Value Date   HGBA1C 5.4 11/30/2017   Lab Results  Component Value Date   VITAMINB12 337 11/12/2016   Lab Results  Component Value Date   TSH 2.140 11/12/2016    01/13/20 MRI lumbar spine [I reviewed images myself and agree with interpretation. -[facility]]  - Early degenerative changes without impingement or inflammation to explain radicular symptoms.   ASSESSMENT AND PLAN  45 y.o. year old female here with left anterolateral thigh pain since 2020, most consistent with left meralgia paresthetica related to obesity.  Also with chronic pain, chronic malaise, fatigue and depression going back to at least 2010.  Has had weight gain from 230 up to 350 pounds over the past 2-3 years.  Dx:  1. Meralgia paraesthetica, left     PLAN:  LEFT THIGH PAIN - likely meralgia paresthetica due to weight; advised on nutrition, exercise, weight loss  CHRONIC FATIGUE / PAIN ISSUES - continue gabapentin 600mg  three times a day + tramadol as needed; consider pain mgmt evaluation  Return for return to PCP.    , MD 06/11/2020, 3:37 PM Certified in Neurology, Neurophysiology and Neuroimaging  [facility] [address], [address] [place], [place] [id number]

## 2020-06-17 ENCOUNTER — Telehealth: Payer: Self-pay | Admitting: Family Medicine

## 2020-06-17 NOTE — Telephone Encounter (Signed)
Pt is wanting to schedule a nurse visit for a PT/INR test. Is this ok to schedule?

## 2020-06-17 NOTE — Telephone Encounter (Signed)
Please let pt know that I will send a message to Dr. Salena Saner for the ok to schedule pt for a nurse visit.  Also I think visit should be scheduled on a day that Dr. Barron Alvine is in office.  Thank you.

## 2020-06-18 NOTE — Telephone Encounter (Signed)
Left message on voicemail to call office.  

## 2020-06-18 NOTE — Telephone Encounter (Signed)
Pt scheduled for tomorrow at 3:40 pm 

## 2020-06-18 NOTE — Telephone Encounter (Signed)
Ok to schedule nurse visit for INR but please do so on a day I'm in the office.

## 2020-06-19 ENCOUNTER — Ambulatory Visit (INDEPENDENT_AMBULATORY_CARE_PROVIDER_SITE_OTHER): Payer: BC Managed Care – PPO

## 2020-06-19 ENCOUNTER — Other Ambulatory Visit: Payer: Self-pay | Admitting: Family Medicine

## 2020-06-19 ENCOUNTER — Other Ambulatory Visit: Payer: Self-pay

## 2020-06-19 DIAGNOSIS — Z86718 Personal history of other venous thrombosis and embolism: Secondary | ICD-10-CM

## 2020-06-19 LAB — POCT INR: INR: 1.9 — AB (ref 2.0–3.0)

## 2020-06-19 MED ORDER — WARFARIN SODIUM 5 MG PO TABS: 5.0000 mg | ORAL_TABLET | Freq: Every day | ORAL | 1 refills | Status: DC

## 2020-06-19 NOTE — Progress Notes (Signed)
Pt here for INR check per Dr. Barron Alvine  Goal INR =  2.0 - 3.0  Last INR =4.2 (on 11/03/19)  Pt currently takes Coumadin 5mg  daily at night. Pt has been noncompliant with her INR checks and taking her meds as directed. The last three days has been taking 3 tablets of 1mg  each due to running out of her 5mg  tablets.   INR today = 1.9  Pt advised per Dr. to resume taking 5mg  daily and repeat labs in one week 06/26/20.

## 2020-06-25 ENCOUNTER — Other Ambulatory Visit: Payer: Self-pay

## 2020-06-26 ENCOUNTER — Ambulatory Visit (INDEPENDENT_AMBULATORY_CARE_PROVIDER_SITE_OTHER): Payer: BC Managed Care – PPO

## 2020-06-26 ENCOUNTER — Other Ambulatory Visit: Payer: Self-pay

## 2020-06-26 DIAGNOSIS — Z86718 Personal history of other venous thrombosis and embolism: Secondary | ICD-10-CM

## 2020-06-26 DIAGNOSIS — Z7901 Long term (current) use of anticoagulants: Secondary | ICD-10-CM

## 2020-06-26 DIAGNOSIS — M5442 Lumbago with sciatica, left side: Secondary | ICD-10-CM

## 2020-06-26 DIAGNOSIS — G8929 Other chronic pain: Secondary | ICD-10-CM

## 2020-06-26 LAB — POCT INR: INR: 3.3 — AB (ref 2.0–3.0)

## 2020-06-26 NOTE — Progress Notes (Signed)
Pt here for INR check per Dr. Barron Alvine  Goal INR = 2.0-3.0  Last INR = 1.9 on 06/19/20  Pt currently takes Coumadin 5mg  Daily  Date of last Coumadin dose = 06/26/20  INR today = 3.3  Per Dr. 06/28/20 advised pt to hold coumadin for 3 days. Then resume 5mg  daily on Sunday April 3rd. Recheck INR in two weeks.

## 2020-06-26 NOTE — Telephone Encounter (Signed)
Last fill 03/28/20  #60/0 Last OV 08/04/19

## 2020-06-27 MED ORDER — TRAMADOL HCL 50 MG PO TABS
50.0000 mg | ORAL_TABLET | Freq: Four times a day (QID) | ORAL | 0 refills | Status: DC
Start: 2020-06-27 — End: 2020-07-31

## 2020-07-10 ENCOUNTER — Other Ambulatory Visit: Payer: Self-pay

## 2020-07-10 ENCOUNTER — Ambulatory Visit (INDEPENDENT_AMBULATORY_CARE_PROVIDER_SITE_OTHER): Payer: BC Managed Care – PPO

## 2020-07-10 DIAGNOSIS — Z7901 Long term (current) use of anticoagulants: Secondary | ICD-10-CM

## 2020-07-10 NOTE — Progress Notes (Signed)
Pt here for INR check per Dr. Barron Alvine  Goal INR = 2.0-3.0  Last INR = 3.3  Pt currently takes Coumadin 5mg  Daily  Date of last Coumadin dose = 07/10/2020  INR today = 2.7  Per Dr. 07/12/2020 pt informed to continue current dose and f/u in 1 month.   Pt notified and verbalized understanding.

## 2020-07-11 LAB — PROTIME-INR
INR: 2.7 — ABNORMAL HIGH
Prothrombin Time: 25.4 s — ABNORMAL HIGH (ref 9.0–11.5)

## 2020-07-15 ENCOUNTER — Encounter: Payer: Self-pay | Admitting: Family Medicine

## 2020-07-15 DIAGNOSIS — G8929 Other chronic pain: Secondary | ICD-10-CM

## 2020-07-15 DIAGNOSIS — M5442 Lumbago with sciatica, left side: Secondary | ICD-10-CM

## 2020-07-15 DIAGNOSIS — I10 Essential (primary) hypertension: Secondary | ICD-10-CM

## 2020-07-15 MED ORDER — VERAPAMIL HCL ER 180 MG PO TBCR: 180.0000 mg | EXTENDED_RELEASE_TABLET | Freq: Every day | ORAL | 1 refills | Status: DC

## 2020-07-15 NOTE — Telephone Encounter (Signed)
Last fill 01/11/20  #90/1 Last OV 08/04/19

## 2020-07-17 MED ORDER — VERAPAMIL HCL ER 180 MG PO TBCR: 180.0000 mg | EXTENDED_RELEASE_TABLET | Freq: Every day | ORAL | 1 refills | Status: DC

## 2020-07-17 MED ORDER — GABAPENTIN 600 MG PO TABS
600.0000 mg | ORAL_TABLET | Freq: Three times a day (TID) | ORAL | 1 refills | Status: DC
Start: 2020-07-17 — End: 2021-06-25

## 2020-07-19 ENCOUNTER — Other Ambulatory Visit: Payer: Self-pay | Admitting: Family Medicine

## 2020-07-19 DIAGNOSIS — R609 Edema, unspecified: Secondary | ICD-10-CM

## 2020-07-19 DIAGNOSIS — I10 Essential (primary) hypertension: Secondary | ICD-10-CM

## 2020-07-28 ENCOUNTER — Other Ambulatory Visit: Payer: Self-pay | Admitting: Family Medicine

## 2020-07-28 DIAGNOSIS — G8929 Other chronic pain: Secondary | ICD-10-CM

## 2020-07-31 NOTE — Telephone Encounter (Signed)
60 tabs sent to pharm Pt needs OV since she has not been seen x 1 year

## 2020-08-05 ENCOUNTER — Other Ambulatory Visit: Payer: Self-pay | Admitting: Family Medicine

## 2020-08-05 ENCOUNTER — Encounter: Payer: Self-pay | Admitting: Family Medicine

## 2020-08-05 DIAGNOSIS — I10 Essential (primary) hypertension: Secondary | ICD-10-CM

## 2020-08-06 ENCOUNTER — Other Ambulatory Visit: Payer: Self-pay

## 2020-08-06 DIAGNOSIS — I10 Essential (primary) hypertension: Secondary | ICD-10-CM

## 2020-08-06 MED ORDER — LOSARTAN POTASSIUM-HCTZ 100-25 MG PO TABS: 1.0000 | ORAL_TABLET | Freq: Every day | ORAL | 0 refills | Status: DC

## 2020-08-19 ENCOUNTER — Other Ambulatory Visit: Payer: Self-pay | Admitting: Family Medicine

## 2020-08-19 DIAGNOSIS — R609 Edema, unspecified: Secondary | ICD-10-CM

## 2020-08-19 DIAGNOSIS — I10 Essential (primary) hypertension: Secondary | ICD-10-CM

## 2020-08-19 NOTE — Telephone Encounter (Signed)
Please call the patient to schedule an appointment for medication refills. Thanks so much.

## 2020-08-28 ENCOUNTER — Other Ambulatory Visit: Payer: Self-pay | Admitting: Family Medicine

## 2020-08-28 DIAGNOSIS — G8929 Other chronic pain: Secondary | ICD-10-CM

## 2020-09-03 ENCOUNTER — Other Ambulatory Visit: Payer: Self-pay | Admitting: Family Medicine

## 2020-09-03 DIAGNOSIS — I10 Essential (primary) hypertension: Secondary | ICD-10-CM

## 2020-09-11 ENCOUNTER — Other Ambulatory Visit: Payer: Self-pay

## 2020-09-11 ENCOUNTER — Encounter: Payer: Self-pay | Admitting: Family Medicine

## 2020-09-11 ENCOUNTER — Ambulatory Visit: Payer: BC Managed Care – PPO | Admitting: Family Medicine

## 2020-09-11 ENCOUNTER — Other Ambulatory Visit: Payer: Self-pay | Admitting: Family Medicine

## 2020-09-11 VITALS — BP 150/100 | HR 91 | Temp 98.9°F | Ht 68.5 in | Wt 345.0 lb

## 2020-09-11 DIAGNOSIS — D6861 Antiphospholipid syndrome: Secondary | ICD-10-CM

## 2020-09-11 DIAGNOSIS — R7303 Prediabetes: Secondary | ICD-10-CM

## 2020-09-11 DIAGNOSIS — E559 Vitamin D deficiency, unspecified: Secondary | ICD-10-CM

## 2020-09-11 DIAGNOSIS — Z86718 Personal history of other venous thrombosis and embolism: Secondary | ICD-10-CM

## 2020-09-11 DIAGNOSIS — R609 Edema, unspecified: Secondary | ICD-10-CM

## 2020-09-11 DIAGNOSIS — I10 Essential (primary) hypertension: Secondary | ICD-10-CM

## 2020-09-11 DIAGNOSIS — Z7901 Long term (current) use of anticoagulants: Secondary | ICD-10-CM

## 2020-09-11 DIAGNOSIS — F3289 Other specified depressive episodes: Secondary | ICD-10-CM

## 2020-09-11 DIAGNOSIS — E785 Hyperlipidemia, unspecified: Secondary | ICD-10-CM

## 2020-09-11 MED ORDER — WARFARIN SODIUM 5 MG PO TABS: 5.0000 mg | ORAL_TABLET | Freq: Every day | ORAL | 3 refills | Status: DC

## 2020-09-11 MED ORDER — DULOXETINE HCL 60 MG PO CPEP: 60.0000 mg | ORAL_CAPSULE | Freq: Every day | ORAL | 3 refills | Status: DC

## 2020-09-11 MED ORDER — FUROSEMIDE 20 MG PO TABS: 20.0000 mg | ORAL_TABLET | Freq: Every day | ORAL | 3 refills | Status: DC

## 2020-09-11 MED ORDER — VERAPAMIL HCL ER 240 MG PO TBCR: 240.0000 mg | EXTENDED_RELEASE_TABLET | Freq: Every day | ORAL | 3 refills | Status: DC

## 2020-09-11 MED ORDER — LOSARTAN POTASSIUM-HCTZ 100-25 MG PO TABS: 1.0000 | ORAL_TABLET | Freq: Every day | ORAL | 3 refills | Status: DC

## 2020-09-11 NOTE — Telephone Encounter (Signed)
Appt today @ 4 pm. Dm/cma

## 2020-09-11 NOTE — Progress Notes (Signed)
Chief Complaint  Patient presents with   Follow-up    Medication refill    HPI: Lauren Burns is a 45 y.o. female here for HTN, IFG, HLD, Vit D deficiency follow-up. For HTN pt is taking hyzaar 100-25mg  daily, verapamil CR 180mg  daily.  Home BP readings can vary but averages 140-150/90-100.  She is taking lasix 20mg  daily which has significantly improved her LE edema.   She would like to try to increase cymbalta from 30mg  to 60mg . She was on 60mg  in the past. She feels down more often and overwhelmed.   BP Readings from Last 3 Encounters:  09/11/20 (!) 150/100  06/11/20 (!) 148/87  08/04/19 (!) 140/100   Lab Results  Component Value Date   CREATININE 0.87 11/30/2017   BUN 16 11/30/2017   NA 141 11/30/2017   K 3.9 11/30/2017   CL 96 11/30/2017   CO2 23 11/30/2017   Lab Results  Component Value Date   HGBA1C 5.4 11/30/2017   Lab Results  Component Value Date   CHOL 205 (H) 11/30/2017   HDL 57 11/30/2017   LDLCALC 133 (H) 11/30/2017   TRIG 73 11/30/2017   CHOLHDL 3.9 05/21/2016     Past Medical History:  Diagnosis Date   Antiphospholipid antibody syndrome (HCC)    Anxiety    Arthritis    Depression    Endometriosis    Endometriosis    Fatigue    Fibromyalgia    GERD (gastroesophageal reflux disease)    Heart murmur    Hypertension    IBS (irritable bowel syndrome)    IBS (irritable bowel syndrome)    Obesity    Pulmonary embolism (HCC) 2004   Shortness of breath    Sleep apnea    Vitamin D deficiency     History reviewed. No pertinent surgical history.  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/30/2018  Tobacco Use   Smoking status: Former    Pack years: 0.00    Types: Cigarettes    Quit date: 12/17/2008    Years since  quitting: 11.7   Smokeless tobacco: Never  Substance and Sexual Activity   Alcohol use: No   Drug use: No   Sexual activity: Not on file  Other Topics Concern   Not on file  Social History Narrative   Lives with husband, son   Social Determinants of Health   Financial Resource Strain: Not on file  Food Insecurity: Not on file  Transportation Needs: Not on file  Physical Activity: Not on file  Stress: Not on file  Social Connections: Not on file  Intimate Partner Violence: Not on file    Family History  Problem Relation Age of Onset   Hyperlipidemia Mother    Depression Mother    Anxiety disorder Mother    Obesity Mother    Heart disease Father    Alcohol abuse Father    Hypertension Father    Cancer Father    Alcoholism Father    Obesity Father    Diabetes Mellitus I Brother      Immunization History  Administered Date(s) Administered   Influenza,inj,Quad PF,6+ Mos 12/25/2014, 12/17/2016, 12/16/2017   Influenza-Unspecified 11/10/2016   Tdap 09/27/2009    Outpatient Encounter Medications  as of 09/11/2020  Medication Sig   gabapentin (NEURONTIN) 600 MG tablet Take 1 tablet (600 mg total) by mouth 3 (three) times daily.   traMADol (ULTRAM) 50 MG tablet TAKE 1 TABLET(50 MG) BY MOUTH FOUR TIMES DAILY   verapamil (CALAN-SR) 240 MG CR tablet Take 1 tablet (240 mg total) by mouth at bedtime.   warfarin (COUMADIN) 5 MG tablet Take 1 tablet (5 mg total) by mouth daily.   [DISCONTINUED] DULoxetine (CYMBALTA) 30 MG capsule TAKE 1 CAPSULE(30 MG) BY MOUTH DAILY   [DISCONTINUED] furosemide (LASIX) 20 MG tablet TAKE 1 TABLET(20 MG) BY MOUTH DAILY   [DISCONTINUED] losartan-hydrochlorothiazide (HYZAAR) 100-25 MG tablet TAKE 1 TABLET BY MOUTH DAILY   [DISCONTINUED] verapamil (CALAN-SR) 180 MG CR tablet Take 1 tablet (180 mg total) by mouth at bedtime.   DULoxetine (CYMBALTA) 60 MG capsule Take 1 capsule (60 mg total) by mouth daily.   furosemide (LASIX) 20 MG tablet Take 1 tablet  (20 mg total) by mouth daily.   losartan-hydrochlorothiazide (HYZAAR) 100-25 MG tablet Take 1 tablet by mouth daily.   [DISCONTINUED] DULoxetine (CYMBALTA) 30 MG capsule TAKE 1 CAPSULE(30 MG) BY MOUTH DAILY   No facility-administered encounter medications on file as of 09/11/2020.     ROS: Pertinent positives and negatives noted in HPI. Remainder of ROS non-contributory    No Known Allergies  BP (!) 150/100 (BP Location: Left Arm, Patient Position: Sitting, Cuff Size: Large)   Pulse 91   Temp 98.9 F (37.2 C) (Oral)   Ht 5' 8.5" (1.74 m)   Wt (!) 345 lb (156.5 kg)   LMP 12/21/2016   SpO2 97%   BMI 51.69 kg/m  Wt Readings from Last 3 Encounters:  09/11/20 (!) 345 lb (156.5 kg)  06/11/20 (!) 348 lb 12.8 oz (158.2 kg)  08/04/19 (!) 337 lb 6.4 oz (153 kg)   Temp Readings from Last 3 Encounters:  09/11/20 98.9 F (37.2 C) (Oral)  08/04/19 97.9 F (36.6 C) (Temporal)  07/07/19 98.5 F (36.9 C) (Oral)   BP Readings from Last 3 Encounters:  09/11/20 (!) 150/100  06/11/20 (!) 148/87  08/04/19 (!) 140/100   Pulse Readings from Last 3 Encounters:  09/11/20 91  06/11/20 90  08/04/19 77     Physical Exam Constitutional:      General: She is not in acute distress.    Appearance: She is obese. She is not ill-appearing.  Cardiovascular:     Rate and Rhythm: Normal rate.     Pulses: Normal pulses.  Pulmonary:     Effort: Pulmonary effort is normal.     Breath sounds: No wheezing or rhonchi.  Musculoskeletal:     Right lower leg: No edema.     Left lower leg: No edema.  Neurological:     Mental Status: She is alert and oriented to person, place, and time.  Psychiatric:        Behavior: Behavior normal.     A/P:  1. Essential hypertension - elevated, not controlled Increase: - verapamil (CALAN-SR) 240 MG CR tablet; Take 1 tablet (240 mg total) by mouth at bedtime.  Dispense: 90 tablet; Refill: 3 - from 180mg  daily Refill: - losartan-hydrochlorothiazide (HYZAAR)  100-25 MG tablet; Take 1 tablet by mouth daily.  Dispense: 90 tablet; Refill: 3 - Comprehensive metabolic panel; Future - CBC; Future - low sodium diet, weight loss Refill: - furosemide (LASIX) 20 MG tablet; Take 1 tablet (20 mg total) by mouth daily.  Dispense: 90 tablet; Refill: 3 -  check BP at home 3-4x/wk x 2 wks and send readings to me via MyChart  2. Other depression Increased : - DULoxetine (CYMBALTA) 60 MG capsule; Take 1 capsule (60 mg total) by mouth daily.  Dispense: 90 capsule; Refill: 3 - increased from 30mg  daily  3. Prediabetes - Hemoglobin A1c; Future - Comprehensive metabolic panel; Future  4. Vitamin D deficiency - VITAMIN D 25 Hydroxy (Vit-D Deficiency, Fractures); Future  5. Hyperlipidemia, unspecified hyperlipidemia type - Lipid panel; Future  6. Chronic anticoagulation 7. Antiphospholipid antibody syndrome (HCC) - Protime-INR; Future - on coumadin 5mg  daily, overdue for INR  8. Edema, unspecified type - controlled Refil: - furosemide (LASIX) 20 MG tablet; Take 1 tablet (20 mg total) by mouth daily.  Dispense: 90 tablet; Refill: 3   This visit occurred during the SARS-CoV-2 public health emergency.  Safety protocols were in place, including screening questions prior to the visit, additional usage of staff PPE, and extensive cleaning of exam room while observing appropriate contact time as indicated for disinfecting solutions.

## 2020-09-16 ENCOUNTER — Other Ambulatory Visit: Payer: Self-pay

## 2020-09-16 ENCOUNTER — Other Ambulatory Visit (INDEPENDENT_AMBULATORY_CARE_PROVIDER_SITE_OTHER): Payer: BC Managed Care – PPO

## 2020-09-16 DIAGNOSIS — D6861 Antiphospholipid syndrome: Secondary | ICD-10-CM

## 2020-09-16 DIAGNOSIS — E785 Hyperlipidemia, unspecified: Secondary | ICD-10-CM

## 2020-09-16 DIAGNOSIS — E559 Vitamin D deficiency, unspecified: Secondary | ICD-10-CM | POA: Diagnosis not present

## 2020-09-16 DIAGNOSIS — I10 Essential (primary) hypertension: Secondary | ICD-10-CM

## 2020-09-16 DIAGNOSIS — R7303 Prediabetes: Secondary | ICD-10-CM | POA: Diagnosis not present

## 2020-09-16 DIAGNOSIS — Z7901 Long term (current) use of anticoagulants: Secondary | ICD-10-CM

## 2020-09-16 LAB — COMPREHENSIVE METABOLIC PANEL
ALT: 13 U/L (ref 0–35)
AST: 15 U/L (ref 0–37)
Albumin: 4 g/dL (ref 3.5–5.2)
Alkaline Phosphatase: 92 U/L (ref 39–117)
BUN: 14 mg/dL (ref 6–23)
CO2: 33 mEq/L — ABNORMAL HIGH (ref 19–32)
Calcium: 9 mg/dL (ref 8.4–10.5)
Chloride: 100 mEq/L (ref 96–112)
Creatinine, Ser: 0.79 mg/dL (ref 0.40–1.20)
GFR: 90.05 mL/min (ref >60.00)
Glucose, Bld: 97 mg/dL (ref 70–99)
Potassium: 4 mEq/L (ref 3.5–5.1)
Sodium: 139 mEq/L (ref 135–145)
Total Bilirubin: 0.6 mg/dL (ref 0.2–1.2)
Total Protein: 6.7 g/dL (ref 6.0–8.3)

## 2020-09-16 LAB — CBC
HCT: 40 % (ref 36.0–46.0)
Hemoglobin: 13.5 g/dL (ref 12.0–15.0)
MCHC: 33.8 g/dL (ref 30.0–36.0)
MCV: 88 fl (ref 78.0–100.0)
Platelets: 248 10*3/uL (ref 150.0–400.0)
RBC: 4.54 Mil/uL (ref 3.87–5.11)
RDW: 13.3 % (ref 11.5–15.5)
WBC: 4.9 10*3/uL (ref 4.0–10.5)

## 2020-09-16 LAB — LIPID PANEL
Cholesterol: 197 mg/dL (ref 0–200)
HDL: 46.3 mg/dL (ref >39.00)
LDL Cholesterol: 127 mg/dL — ABNORMAL HIGH (ref 0–99)
NonHDL: 150.68
Total CHOL/HDL Ratio: 4
Triglycerides: 117 mg/dL (ref 0.0–149.0)
VLDL: 23.4 mg/dL (ref 0.0–40.0)

## 2020-09-16 LAB — HEMOGLOBIN A1C: Hgb A1c MFr Bld: 5.8 % (ref 4.6–6.5)

## 2020-09-16 LAB — PROTIME-INR
INR: 3 ratio — ABNORMAL HIGH (ref 0.8–1.0)
Prothrombin Time: 33.1 s — ABNORMAL HIGH (ref 9.6–13.1)

## 2020-09-16 LAB — VITAMIN D 25 HYDROXY (VIT D DEFICIENCY, FRACTURES): VITD: 22.31 ng/mL — ABNORMAL LOW (ref 30.00–100.00)

## 2020-09-16 NOTE — Progress Notes (Signed)
Per orders of Dr. Barron Alvine pt Is here for labs, pt tolerated draw well.

## 2020-10-01 ENCOUNTER — Other Ambulatory Visit: Payer: Self-pay | Admitting: Family Medicine

## 2020-10-01 ENCOUNTER — Encounter: Payer: Self-pay | Admitting: Family Medicine

## 2020-10-01 DIAGNOSIS — G8929 Other chronic pain: Secondary | ICD-10-CM

## 2020-10-11 ENCOUNTER — Other Ambulatory Visit: Payer: Self-pay | Admitting: Family Medicine

## 2020-10-11 DIAGNOSIS — I10 Essential (primary) hypertension: Secondary | ICD-10-CM

## 2020-10-30 ENCOUNTER — Other Ambulatory Visit: Payer: Self-pay | Admitting: Family Medicine

## 2020-10-30 DIAGNOSIS — E559 Vitamin D deficiency, unspecified: Secondary | ICD-10-CM

## 2020-10-30 DIAGNOSIS — R7303 Prediabetes: Secondary | ICD-10-CM

## 2020-10-30 MED ORDER — VITAMIN D (ERGOCALCIFEROL) 1.25 MG (50000 UNIT) PO CAPS
50000.0000 [IU] | ORAL_CAPSULE | ORAL | 2 refills | Status: DC
Start: 2020-10-30 — End: 2021-05-05

## 2020-11-18 ENCOUNTER — Telehealth: Payer: Self-pay

## 2020-11-18 ENCOUNTER — Other Ambulatory Visit: Payer: Self-pay | Admitting: Family

## 2020-11-18 DIAGNOSIS — G8929 Other chronic pain: Secondary | ICD-10-CM

## 2020-11-18 MED ORDER — TRAMADOL HCL 50 MG PO TABS: ORAL_TABLET | ORAL | 0 refills | Status: DC

## 2020-11-18 NOTE — Telephone Encounter (Signed)
Patient notified VIA phone and advised to make an appointment with one of our provider to [facility]. She will call back later to do that.  Dm/cma

## 2020-11-18 NOTE — Telephone Encounter (Signed)
Refill request for: Tramadol 50 mg LR 10/02/20, #60, 0 rfs LOV 09/11/20 FOV  none scheduled.   [facility] - [address]  Please review and advise.   Thanks.  Dm/cma

## 2021-04-23 DIAGNOSIS — Z1231 Encounter for screening mammogram for malignant neoplasm of breast: Secondary | ICD-10-CM | POA: Diagnosis not present

## 2021-04-23 LAB — HM MAMMOGRAPHY

## 2021-05-05 ENCOUNTER — Encounter: Payer: Self-pay | Admitting: Nurse Practitioner

## 2021-05-05 ENCOUNTER — Other Ambulatory Visit: Payer: Self-pay

## 2021-05-05 ENCOUNTER — Ambulatory Visit: Payer: BC Managed Care – PPO | Admitting: Nurse Practitioner

## 2021-05-05 VITALS — BP 132/90 | HR 70 | Temp 96.5°F | Ht 68.5 in | Wt 289.4 lb

## 2021-05-05 DIAGNOSIS — I10 Essential (primary) hypertension: Secondary | ICD-10-CM | POA: Diagnosis not present

## 2021-05-05 DIAGNOSIS — R7303 Prediabetes: Secondary | ICD-10-CM

## 2021-05-05 DIAGNOSIS — D6861 Antiphospholipid syndrome: Secondary | ICD-10-CM

## 2021-05-05 DIAGNOSIS — Z114 Encounter for screening for human immunodeficiency virus [HIV]: Secondary | ICD-10-CM

## 2021-05-05 DIAGNOSIS — K219 Gastro-esophageal reflux disease without esophagitis: Secondary | ICD-10-CM

## 2021-05-05 DIAGNOSIS — Z86711 Personal history of pulmonary embolism: Secondary | ICD-10-CM

## 2021-05-05 DIAGNOSIS — Z23 Encounter for immunization: Secondary | ICD-10-CM | POA: Diagnosis not present

## 2021-05-05 DIAGNOSIS — F32A Depression, unspecified: Secondary | ICD-10-CM

## 2021-05-05 DIAGNOSIS — Z7901 Long term (current) use of anticoagulants: Secondary | ICD-10-CM

## 2021-05-05 DIAGNOSIS — E559 Vitamin D deficiency, unspecified: Secondary | ICD-10-CM

## 2021-05-05 DIAGNOSIS — F419 Anxiety disorder, unspecified: Secondary | ICD-10-CM

## 2021-05-05 DIAGNOSIS — G43009 Migraine without aura, not intractable, without status migrainosus: Secondary | ICD-10-CM

## 2021-05-05 DIAGNOSIS — Q210 Ventricular septal defect: Secondary | ICD-10-CM

## 2021-05-05 DIAGNOSIS — E785 Hyperlipidemia, unspecified: Secondary | ICD-10-CM

## 2021-05-05 DIAGNOSIS — Z1159 Encounter for screening for other viral diseases: Secondary | ICD-10-CM

## 2021-05-05 DIAGNOSIS — Z1211 Encounter for screening for malignant neoplasm of colon: Secondary | ICD-10-CM

## 2021-05-05 LAB — LIPID PANEL
Cholesterol: 274 mg/dL — ABNORMAL HIGH (ref 0–200)
HDL: 62.3 mg/dL (ref >39.00)
LDL Cholesterol: 193 mg/dL — ABNORMAL HIGH (ref 0–99)
NonHDL: 212.08
Total CHOL/HDL Ratio: 4
Triglycerides: 93 mg/dL (ref 0.0–149.0)
VLDL: 18.6 mg/dL (ref 0.0–40.0)

## 2021-05-05 LAB — CBC WITH DIFFERENTIAL/PLATELET
Basophils Absolute: 0 10*3/uL (ref 0.0–0.1)
Basophils Relative: 0.6 % (ref 0.0–3.0)
Eosinophils Absolute: 0.3 10*3/uL (ref 0.0–0.7)
Eosinophils Relative: 3.9 % (ref 0.0–5.0)
HCT: 41.6 % (ref 36.0–46.0)
Hemoglobin: 13.5 g/dL (ref 12.0–15.0)
Lymphocytes Relative: 31.1 % (ref 12.0–46.0)
Lymphs Abs: 2.4 10*3/uL (ref 0.7–4.0)
MCHC: 32.4 g/dL (ref 30.0–36.0)
MCV: 90.2 fl (ref 78.0–100.0)
Monocytes Absolute: 0.5 10*3/uL (ref 0.1–1.0)
Monocytes Relative: 6 % (ref 3.0–12.0)
Neutro Abs: 4.5 10*3/uL (ref 1.4–7.7)
Neutrophils Relative %: 58.4 % (ref 43.0–77.0)
Platelets: 267 10*3/uL (ref 150.0–400.0)
RBC: 4.62 Mil/uL (ref 3.87–5.11)
RDW: 14.1 % (ref 11.5–15.5)
WBC: 7.7 10*3/uL (ref 4.0–10.5)

## 2021-05-05 LAB — COMPREHENSIVE METABOLIC PANEL
ALT: 13 U/L (ref 0–35)
AST: 15 U/L (ref 0–37)
Albumin: 4.4 g/dL (ref 3.5–5.2)
Alkaline Phosphatase: 77 U/L (ref 39–117)
BUN: 20 mg/dL (ref 6–23)
CO2: 30 mEq/L (ref 19–32)
Calcium: 9.6 mg/dL (ref 8.4–10.5)
Chloride: 98 mEq/L (ref 96–112)
Creatinine, Ser: 0.71 mg/dL (ref 0.40–1.20)
GFR: 101.91 mL/min (ref >60.00)
Glucose, Bld: 90 mg/dL (ref 70–99)
Potassium: 4.2 mEq/L (ref 3.5–5.1)
Sodium: 136 mEq/L (ref 135–145)
Total Bilirubin: 0.4 mg/dL (ref 0.2–1.2)
Total Protein: 7.3 g/dL (ref 6.0–8.3)

## 2021-05-05 LAB — HEMOGLOBIN A1C: Hgb A1c MFr Bld: 5.5 % (ref 4.6–6.5)

## 2021-05-05 LAB — VITAMIN D 25 HYDROXY (VIT D DEFICIENCY, FRACTURES): VITD: 26.02 ng/mL — ABNORMAL LOW (ref 30.00–100.00)

## 2021-05-05 LAB — TSH: TSH: 2.75 u[IU]/mL (ref 0.35–5.50)

## 2021-05-05 LAB — PROTIME-INR
INR: 2 ratio — ABNORMAL HIGH (ref 0.8–1.0)
Prothrombin Time: 20.8 s — ABNORMAL HIGH (ref 9.6–13.1)

## 2021-05-05 MED ORDER — SUMATRIPTAN SUCCINATE 25 MG PO TABS: 25.0000 mg | ORAL_TABLET | Freq: Every day | ORAL | 0 refills | Status: DC | PRN

## 2021-05-05 NOTE — Assessment & Plan Note (Signed)
Check vitamin D today and treat based on results.  °

## 2021-05-05 NOTE — Assessment & Plan Note (Signed)
Chronic, stable. Continue cymbalta daily. Follow up in 6 months.

## 2021-05-05 NOTE — Assessment & Plan Note (Signed)
Chronic, stable. Continue current regimen. Check CMP, CBC, TSH today. Follow up in 6 months.

## 2021-05-05 NOTE — Assessment & Plan Note (Signed)
Check INR today and adjust regimen as needed.

## 2021-05-05 NOTE — Assessment & Plan Note (Signed)
Symptoms not controlled with tylenol and can't take aleve/ibuprofen due to coumadin. Will start imitrex prn migraine. Discussed possible side effects. Follow up in 2 months.

## 2021-05-05 NOTE — Patient Instructions (Signed)
It was great to see you!  We are checking your labs today and will send the results via mychart or call if we need to change medication.   Start imitrex 1 tablet as needed for headache, you can take another tablet 2 hours later if not resolved.   Let's follow-up in 2 months, sooner if you have concerns.  If a referral was placed today, you will be contacted for an appointment. Please note that routine referrals can sometimes take up to 3-4 weeks to process. Please call our office if you haven't heard anything after this time frame.  Take care,  Rodman Pickle, NP

## 2021-05-05 NOTE — Assessment & Plan Note (Signed)
Diagnosed with antiphospholipid antibody syndrome and is on life-long coumadin. INR today.

## 2021-05-05 NOTE — Assessment & Plan Note (Signed)
Diagnosed when she was found to have pulmonary embolism. Recommended she continue life-long anticoagulation with coumadin. She has been taking coumadin 5mg  daily. Will check INR today and adjust regimen as needed.

## 2021-05-05 NOTE — Assessment & Plan Note (Signed)
Chronic, intermittent. Controlled with diet and tums prn. Follow up if symptoms worsen or with any concerns.

## 2021-05-05 NOTE — Assessment & Plan Note (Signed)
Check lipid panel today and treat based on results.  

## 2021-05-05 NOTE — Addendum Note (Signed)
Addended by: Vance Peper A on: 05/05/2021 07:36 PM   Modules accepted: Orders

## 2021-05-05 NOTE — Assessment & Plan Note (Signed)
Murmur noted on exam today. She states that she had this since she was a child and cardiology stated she didn't need any further follow up. Follow up with any concerns, new onset shortness of breath or chest pain.

## 2021-05-05 NOTE — Assessment & Plan Note (Signed)
Check A1C today and treat based on results. She has lost 60 pounds in the last 8 months. Congratulated her on this!

## 2021-05-05 NOTE — Assessment & Plan Note (Signed)
BMI 43 today. She has lost 60 pounds in the last 8 months. Congratulated her on this! Keep up with the diet and exercise modifications she has been doing.

## 2021-05-05 NOTE — Progress Notes (Signed)
Established Patient Office Visit  Subjective:  Patient ID: Lauren Burns, female    DOB: Jul 04, 1975  Age: 46 y.o. MRN: [id number]  CC:  Chief Complaint  Patient presents with   Transitions Of Care    TOC. Est care. Med refills. Pt c/o frequent headaches.     HPI Lauren Burns presents transfer care to a new provider.  Introduced to Publishing rights manager role and practice setting.  All questions answered.  Discussed provider/patient relationship and expectations.  Headaches  For the past several months, she has been having headaches most nights. She went to the eye doctor, and they changed her prescription, but this did not help her headaches. Occur typically in the afternoon/evening. She has tried tylenol but it doesn't help. She goes to sleep which helps. She checks her blood pressure, which is normal in the evenings. Describes pain as a coldness across her forehead. The pain doesn't radiate. Bright lights, loud sounds make the headaches worse. Denies nausea, shortness of breath, and chest pain.   HYPERTENSION  Hypertension status: controlled  Satisfied with current treatment? yes Duration of hypertension: years BP monitoring frequency:  daily BP range: 110s/80s BP medication side effects:  no Medication compliance: excellent compliance Previous BP meds: losartan, hctz, verapamil, lasix Aspirin: no Recurrent headaches: yes Visual changes: no Palpitations: no Dyspnea: no Chest pain: no Lower extremity edema: no Dizzy/lightheaded: no  DEPRESSION/ANXIETY  Symptoms are well controlled on cymbalta. She has been on this medication for at least 17 years. She denies SI/HI. No side effects from medications.  Depression screen [facility] 2/9 05/05/2021 03/01/2018 11/12/2016  Decreased Interest 2 2 1   Down, Depressed, Hopeless 2 1 1   PHQ - 2 Score 4 3 2   Altered sleeping 1 0 2  Tired, decreased energy 3 2 3   Change in appetite 0 0 1  Feeling bad or failure about yourself  0 1 1   Trouble concentrating 1 1 1   Moving slowly or fidgety/restless 0 0 0  Suicidal thoughts 0 0 -  PHQ-9 Score 9 7 10   Difficult doing work/chores Somewhat difficult - -  Some recent data might be hidden   GAD 7 : Generalized Anxiety Score 05/05/2021  Nervous, Anxious, on Edge 1  Control/stop worrying 1  Worry too much - different things 1  Trouble relaxing 1  Restless 0  Easily annoyed or irritable 0  Afraid - awful might happen 0  Total GAD 7 Score 4  Anxiety Difficulty Somewhat difficult    Past Medical History:  Diagnosis Date   Antiphospholipid antibody syndrome (HCC)    Anxiety    Arthritis    Depression    Endometriosis    Fatigue    Fibromyalgia    GERD (gastroesophageal reflux disease)    Heart murmur    Hypertension    IBS (irritable bowel syndrome)    Meralgia paresthetica of left side    Obesity    Pulmonary embolism (HCC) 2004   Shortness of breath    Sleep apnea    Vitamin D deficiency     Past Surgical History:  Procedure Laterality Date   laprascopic     diagnosed endometriosis    Family History  Problem Relation Age of Onset   Hyperlipidemia Mother    Depression Mother    Anxiety disorder Mother    Obesity Mother    Heart disease Father    Alcohol abuse Father    Hypertension Father    Cancer Father  Alcoholism Father    Obesity Father    Diabetes Mellitus I Brother     Social History   Socioeconomic History   Marital status: Married    Spouse name: Barbara Cower   Number of children: 1   Years of education: Not on file   Highest education level: Bachelor's degree (e.g., BA, AB, BS)  Occupational History   Occupation: Research scientist (life sciences)  Tobacco Use   Smoking status: Former    Types: Cigarettes    Quit date: 12/17/2008    Years since quitting: 12.3   Smokeless tobacco: Never  Vaping Use   Vaping Use: Never used  Substance and Sexual Activity   Alcohol use: Yes    Alcohol/week: 1.0 standard drink    Types: 1 Glasses of wine per  week   Drug use: No   Sexual activity: Not on file  Other Topics Concern   Not on file  Social History Narrative   Lives with husband, son   Social Determinants of Health   Financial Resource Strain: Not on file  Food Insecurity: Not on file  Transportation Needs: Not on file  Physical Activity: Not on file  Stress: Not on file  Social Connections: Not on file  Intimate Partner Violence: Not on file    Outpatient Medications Prior to Visit  Medication Sig Dispense Refill   DULoxetine (CYMBALTA) 60 MG capsule Take 1 capsule (60 mg total) by mouth daily. 90 capsule 3   furosemide (LASIX) 20 MG tablet Take 1 tablet (20 mg total) by mouth daily. 90 tablet 3   gabapentin (NEURONTIN) 600 MG tablet Take 1 tablet (600 mg total) by mouth 3 (three) times daily. 270 tablet 1   losartan-hydrochlorothiazide (HYZAAR) 100-25 MG tablet TAKE 1 TABLET BY MOUTH DAILY 90 tablet 1   verapamil (CALAN-SR) 240 MG CR tablet Take 1 tablet (240 mg total) by mouth at bedtime. 90 tablet 3   warfarin (COUMADIN) 5 MG tablet Take 1 tablet (5 mg total) by mouth daily. 90 tablet 3   traMADol (ULTRAM) 50 MG tablet TAKE 1 TABLET(50 MG) BY MOUTH FOUR TIMES DAILY (Patient not taking: Reported on 05/05/2021) 60 tablet 0   Vitamin D, Ergocalciferol, (DRISDOL) 1.25 MG (50000 UNIT) CAPS capsule Take 1 capsule (50,000 Units total) by mouth every 7 (seven) days. (Patient not taking: Reported on 05/05/2021) 5 capsule 2   No facility-administered medications prior to visit.    No Known Allergies  ROS Review of Systems  Constitutional:  Positive for fatigue.  HENT: Negative.    Eyes: Negative.   Respiratory: Negative.    Cardiovascular: Negative.   Gastrointestinal: Negative.   Genitourinary: Negative.   Musculoskeletal:  Positive for myalgias.  Skin: Negative.   Neurological:  Positive for headaches. Negative for dizziness.  Psychiatric/Behavioral: Negative.       Objective:    Physical Exam Vitals and nursing  note reviewed.  Constitutional:      General: She is not in acute distress.    Appearance: Normal appearance.  HENT:     Head: Normocephalic and atraumatic.  Eyes:     Conjunctiva/sclera: Conjunctivae normal.  Cardiovascular:     Rate and Rhythm: Normal rate and regular rhythm.     Pulses: Normal pulses.     Heart sounds: Murmur heard.  Pulmonary:     Effort: Pulmonary effort is normal.     Breath sounds: Normal breath sounds.  Musculoskeletal:     Cervical back: Normal range of motion.  Skin:  General: Skin is warm and dry.  Neurological:     General: No focal deficit present.     Mental Status: She is alert and oriented to person, place, and time.  Psychiatric:        Mood and Affect: Mood normal.        Behavior: Behavior normal.        Thought Content: Thought content normal.        Judgment: Judgment normal.    BP 132/90    Pulse 70    Temp (!) 96.5 F (35.8 C) (Temporal)    Ht 5' 8.5" (1.74 m)    Wt 289 lb 6.4 oz (131.3 kg)    LMP 12/21/2016    SpO2 99%    BMI 43.36 kg/m  Wt Readings from Last 3 Encounters:  05/05/21 289 lb 6.4 oz (131.3 kg)  09/11/20 (!) 345 lb (156.5 kg)  06/11/20 (!) 348 lb 12.8 oz (158.2 kg)     Health Maintenance Due  Topic Date Due   Hepatitis C Screening  Never done   PAP SMEAR-Modifier  Never done   COLONOSCOPY (Pts 45-63yrs Insurance coverage will need to be confirmed)  Never done   INFLUENZA VACCINE  10/28/2020    There are no preventive care reminders to display for this patient.  Lab Results  Component Value Date   TSH 2.75 05/05/2021   Lab Results  Component Value Date   WBC 7.7 05/05/2021   HGB 13.5 05/05/2021   HCT 41.6 05/05/2021   MCV 90.2 05/05/2021   PLT 267.0 05/05/2021   Lab Results  Component Value Date   NA 136 05/05/2021   K 4.2 05/05/2021   CO2 30 05/05/2021   GLUCOSE 90 05/05/2021   BUN 20 05/05/2021   CREATININE 0.71 05/05/2021   BILITOT 0.4 05/05/2021   ALKPHOS 77 05/05/2021   AST 15 05/05/2021    ALT 13 05/05/2021   PROT 7.3 05/05/2021   ALBUMIN 4.4 05/05/2021   CALCIUM 9.6 05/05/2021   GFR 101.91 05/05/2021   Lab Results  Component Value Date   CHOL 274 (H) 05/05/2021   Lab Results  Component Value Date   HDL 62.30 05/05/2021   Lab Results  Component Value Date   LDLCALC 193 (H) 05/05/2021   Lab Results  Component Value Date   TRIG 93.0 05/05/2021   Lab Results  Component Value Date   CHOLHDL 4 05/05/2021   Lab Results  Component Value Date   HGBA1C 5.5 05/05/2021      Assessment & Plan:   Problem List Items Addressed This Visit       Cardiovascular and Mediastinum   Ventricular septal defect (VSD), membranous - Primary    Murmur noted on exam today. She states that she had this since she was a child and cardiology stated she didn't need any further follow up. Follow up with any concerns, new onset shortness of breath or chest pain.       Essential hypertension    Chronic, stable. Continue current regimen. Check CMP, CBC, TSH today. Follow up in 6 months.       Relevant Orders   CBC with Differential/Platelet (Completed)   Comprehensive metabolic panel (Completed)   TSH (Completed)   Migraine without aura and without status migrainosus, not intractable    Symptoms not controlled with tylenol and can't take aleve/ibuprofen due to coumadin. Will start imitrex prn migraine. Discussed possible side effects. Follow up in 2 months.  Relevant Medications   SUMAtriptan (IMITREX) 25 MG tablet     Digestive   GERD    Chronic, intermittent. Controlled with diet and tums prn. Follow up if symptoms worsen or with any concerns.         Hematopoietic and Hemostatic   Antiphospholipid antibody syndrome (HCC)    Diagnosed when she was found to have pulmonary embolism. Recommended she continue life-long anticoagulation with coumadin. She has been taking coumadin 5mg  daily. Will check INR today and adjust regimen as needed.         Other    Hyperlipidemia    Check lipid panel today and treat based on results.       Relevant Orders   Lipid panel (Completed)   Morbid obesity (HCC)    BMI 43 today. She has lost 60 pounds in the last 8 months. Congratulated her on this! Keep up with the diet and exercise modifications she has been doing.       Vitamin D deficiency    Check vitamin D today and treat based on results.       Relevant Orders   VITAMIN D 25 Hydroxy (Vit-D Deficiency, Fractures) (Completed)   History of pulmonary embolism    Diagnosed with antiphospholipid antibody syndrome and is on life-long coumadin. INR today.       Relevant Orders   Protime-INR (Completed)   Prediabetes    Check A1C today and treat based on results. She has lost 60 pounds in the last 8 months. Congratulated her on this!       Relevant Orders   Hemoglobin A1c (Completed)   Long term current use of anticoagulants with INR goal of 2.0-3.0    Check INR today and adjust regimen as needed.       Relevant Orders   Protime-INR (Completed)   Anxiety and depression    Chronic, stable. Continue cymbalta daily. Follow up in 6 months.       Other Visit Diagnoses     Screening for HIV (human immunodeficiency virus)       Screen HIV today   Relevant Orders   HIV Antibody (routine testing w rflx)   Encounter for hepatitis C screening test for low risk patient       Screen hepatitis C today   Relevant Orders   Hepatitis C antibody   Need for Tdap vaccination       Tdap given today.    Relevant Orders   Tdap vaccine greater than or equal to 7yo IM (Completed)       Meds ordered this encounter  Medications   SUMAtriptan (IMITREX) 25 MG tablet    Sig: Take 1 tablet (25 mg total) by mouth daily as needed for migraine. May repeat in 2 hours if headache persists or recurs.    Dispense:  10 tablet    Refill:  0    Follow-up: Return in about 2 months (around 07/03/2021) for CPE.    Gerre ScullLauren A Kaylon Hitz, NP

## 2021-05-08 ENCOUNTER — Encounter: Payer: Self-pay | Admitting: Nurse Practitioner

## 2021-05-08 DIAGNOSIS — G43009 Migraine without aura, not intractable, without status migrainosus: Secondary | ICD-10-CM

## 2021-05-08 LAB — HEPATITIS C ANTIBODY
Hepatitis C Ab: NONREACTIVE
SIGNAL TO CUT-OFF: <0.02 (ref <1.00)

## 2021-05-08 LAB — HIV ANTIBODY (ROUTINE TESTING W REFLEX): HIV 1&2 Ab, 4th Generation: NONREACTIVE

## 2021-05-09 MED ORDER — PREDNISONE 10 MG PO TABS: ORAL_TABLET | ORAL | 0 refills | Status: DC

## 2021-05-20 NOTE — Telephone Encounter (Signed)
Called and spoke to patient. Ov appt scheduled 05/26/21 @10am 

## 2021-05-26 ENCOUNTER — Other Ambulatory Visit: Payer: Self-pay

## 2021-05-26 ENCOUNTER — Encounter: Payer: Self-pay | Admitting: Nurse Practitioner

## 2021-05-26 ENCOUNTER — Ambulatory Visit: Payer: BC Managed Care – PPO | Admitting: Nurse Practitioner

## 2021-05-26 VITALS — BP 138/85 | HR 67 | Temp 97.8°F | Resp 17 | Wt 288.0 lb

## 2021-05-26 DIAGNOSIS — R519 Headache, unspecified: Secondary | ICD-10-CM

## 2021-05-26 DIAGNOSIS — G8929 Other chronic pain: Secondary | ICD-10-CM | POA: Insufficient documentation

## 2021-05-26 NOTE — Assessment & Plan Note (Signed)
Lauren Burns endorses chronic daily headache that is worse in the afternoons and evenings. Neuro exam is WNL, no red flags on exam. She has tried imitrex and prednisone which did not help at all. She does not have a history of headaches. Discussed the possibility of this being a rebound headache with her taking tylenol almost every day for the last few months. She can stop taking tylenol for several days to see if this helps her headache. Discussed that this may cause headaches to be worse at first. Recommend she also start daily loratadine, flonase, and nasal saline spray. With persistent headache, will check MRI and refer to neurology. Keep next scheduled appointment, or sooner if any concerns or worsening symptoms.

## 2021-05-26 NOTE — Patient Instructions (Addendum)
It was great to see you!  Start loratadine (claritin), flonase, and saline nasal spray daily. I have ordered a MRI and placed a referral to neurology. It is also possible this could be a rebound headache. Stop tylenol for several days to see if the headache improves. It may get worse at first.   Keep your appointment in April, sooner if you have concerns.  If a referral was placed today, you will be contacted for an appointment. Please note that routine referrals can sometimes take up to 3-4 weeks to process. Please call our office if you haven't heard anything after this time frame.  Take care,  Rodman Pickle, NP

## 2021-05-26 NOTE — Progress Notes (Signed)
Established Patient Office Visit  Subjective:  Patient ID: Lauren Burns, female    DOB: [birth year]  Age: 46 y.o. MRN: [id number]  CC:  Chief Complaint  Patient presents with   2 Week Follow-up    Headache usually worse in the evenings seems they are still recurrent, pt states sleep is all she feels is helping.     HPI Lauren Burns presents for follow up on ongoing headaches. She was prescribed sumatriptan which did not help with the headaches. She was then prescribed a 6 day prednisone taper which did not help at all either. Her headaches have been ongoing, almost daily, for the last few months.   She had recent eye exam and her prescription was changed, which didn't help her headaches. Describes the pain as pressure and a cold sensation across her forehead. Currently, her pain is a 4/10. Denies nausea, photophobia, phonophobia. Sometimes looking at computer screen makes it worse. Sleep seems to be the only thing that helps. Endorses some left ear pain on and off for the past 2 weeks. Denies sore throat, nasal congestion, post nasal drip, double vision, weakness, and dysphagia. She has been taking tylenol almost daily or twice a day.   Past Medical History:  Diagnosis Date   Antiphospholipid antibody syndrome (HCC)    Anxiety    Arthritis    Depression    Endometriosis    Fatigue    Fibromyalgia    GERD (gastroesophageal reflux disease)    Heart murmur    Hypertension    IBS (irritable bowel syndrome)    Meralgia paresthetica of left side    Obesity    Pulmonary embolism ([place]) 2004   Shortness of breath    Sleep apnea    Vitamin D deficiency     Past Surgical History:  Procedure Laterality Date   laprascopic     diagnosed endometriosis    Family History  Problem Relation Age of Onset   Hyperlipidemia Mother    Depression Mother    Anxiety disorder Mother    Obesity Mother    Heart disease Father    Alcohol abuse Father    Hypertension Father    Cancer  Father    Alcoholism Father    Obesity Father    Diabetes Mellitus I Brother     Social History   Socioeconomic History   Marital status: Married    Spouse name: [name]   Number of children: 1   Years of education: Not on file   Highest education level: Bachelor's degree (e.g., BA, AB, BS)  Occupational History   Occupation: Audiological scientist  Tobacco Use   Smoking status: Former    Types: Cigarettes    Quit date: 12/17/2008    Years since quitting: 12.4   Smokeless tobacco: Never  Vaping Use   Vaping Use: Never used  Substance and Sexual Activity   Alcohol use: Yes    Alcohol/week: 1.0 standard drink    Types: 1 Glasses of wine per week   Drug use: No   Sexual activity: Not on file  Other Topics Concern   Not on file  Social History Narrative   Lives with husband, son   Social Determinants of Health   Financial Resource Strain: Not on file  Food Insecurity: Not on file  Transportation Needs: Not on file  Physical Activity: Not on file  Stress: Not on file  Social Connections: Not on file  Intimate Partner Violence: Not on file  Outpatient Medications Prior to Visit  Medication Sig Dispense Refill   DULoxetine (CYMBALTA) 60 MG capsule Take 1 capsule (60 mg total) by mouth daily. 90 capsule 3   furosemide (LASIX) 20 MG tablet Take 1 tablet (20 mg total) by mouth daily. 90 tablet 3   gabapentin (NEURONTIN) 600 MG tablet Take 1 tablet (600 mg total) by mouth 3 (three) times daily. 270 tablet 1   losartan-hydrochlorothiazide (HYZAAR) 100-25 MG tablet TAKE 1 TABLET BY MOUTH DAILY 90 tablet 1   SUMAtriptan (IMITREX) 25 MG tablet Take 1 tablet (25 mg total) by mouth daily as needed for migraine. May repeat in 2 hours if headache persists or recurs. 10 tablet 0   verapamil (CALAN-SR) 240 MG CR tablet Take 1 tablet (240 mg total) by mouth at bedtime. 90 tablet 3   warfarin (COUMADIN) 5 MG tablet Take 1 tablet (5 mg total) by mouth daily. 90 tablet 3   predniSONE  (DELTASONE) 10 MG tablet Take 6 tablets today, then 5 tablets tomorrow, then decrease by 1 tablet every day until gone 21 tablet 0   No facility-administered medications prior to visit.    No Known Allergies  ROS Review of Systems See pertinent positives and negatives per HPI.   Objective:    Physical Exam Vitals and nursing note reviewed.  Constitutional:      General: She is not in acute distress.    Appearance: Normal appearance.  HENT:     Head: Normocephalic and atraumatic.     Right Ear: Tympanic membrane, ear canal and external ear normal.     Left Ear: Tympanic membrane, ear canal and external ear normal.  Eyes:     Extraocular Movements: Extraocular movements intact.     Conjunctiva/sclera: Conjunctivae normal.     Pupils: Pupils are equal, round, and reactive to light.  Cardiovascular:     Rate and Rhythm: Normal rate and regular rhythm.     Pulses: Normal pulses.     Heart sounds: Normal heart sounds.  Pulmonary:     Effort: Pulmonary effort is normal.     Breath sounds: Normal breath sounds.  Musculoskeletal:     Cervical back: Normal range of motion.  Skin:    General: Skin is warm and dry.  Neurological:     General: No focal deficit present.     Mental Status: She is alert and oriented to person, place, and time.     Cranial Nerves: No cranial nerve deficit.     Motor: No weakness.     Coordination: Coordination normal.     Gait: Gait normal.  Psychiatric:        Mood and Affect: Mood normal.        Behavior: Behavior normal.        Thought Content: Thought content normal.        Judgment: Judgment normal.    BP 138/85 (BP Location: Left Arm, Patient Position: Sitting, Cuff Size: Normal)    Pulse 67    Temp 97.8 F (36.6 C) (Temporal)    Resp 17    Wt 288 lb (130.6 kg)    LMP 12/21/2016    SpO2 97%    BMI 43.15 kg/m  Wt Readings from Last 3 Encounters:  05/26/21 288 lb (130.6 kg)  05/05/21 289 lb 6.4 oz (131.3 kg)  09/11/20 (!) 345 lb (156.5 kg)      Health Maintenance Due  Topic Date Due   PAP SMEAR-Modifier  Never done   COLONOSCOPY (  Pts 45-56yrs Insurance coverage will need to be confirmed)  Never done   INFLUENZA VACCINE  10/28/2020    There are no preventive care reminders to display for this patient.  Lab Results  Component Value Date   TSH 2.75 05/05/2021   Lab Results  Component Value Date   WBC 7.7 05/05/2021   HGB 13.5 05/05/2021   HCT 41.6 05/05/2021   MCV 90.2 05/05/2021   PLT 267.0 05/05/2021   Lab Results  Component Value Date   NA 136 05/05/2021   K 4.2 05/05/2021   CO2 30 05/05/2021   GLUCOSE 90 05/05/2021   BUN 20 05/05/2021   CREATININE 0.71 05/05/2021   BILITOT 0.4 05/05/2021   ALKPHOS 77 05/05/2021   AST 15 05/05/2021   ALT 13 05/05/2021   PROT 7.3 05/05/2021   ALBUMIN 4.4 05/05/2021   CALCIUM 9.6 05/05/2021   GFR 101.91 05/05/2021   Lab Results  Component Value Date   CHOL 274 (H) 05/05/2021   Lab Results  Component Value Date   HDL 62.30 05/05/2021   Lab Results  Component Value Date   LDLCALC 193 (H) 05/05/2021   Lab Results  Component Value Date   TRIG 93.0 05/05/2021   Lab Results  Component Value Date   CHOLHDL 4 05/05/2021   Lab Results  Component Value Date   HGBA1C 5.5 05/05/2021      Assessment & Plan:   Problem List Items Addressed This Visit       Other   Chronic nonintractable headache - Primary    Lauren Burns endorses chronic daily headache that is worse in the afternoons and evenings. Neuro exam is WNL, no red flags on exam. She has tried imitrex and prednisone which did not help at all. She does not have a history of headaches. Discussed the possibility of this being a rebound headache with her taking tylenol almost every day for the last few months. She can stop taking tylenol for several days to see if this helps her headache. Discussed that this may cause headaches to be worse at first. Recommend she also start daily loratadine, flonase, and nasal  saline spray. With persistent headache, will check MRI and refer to neurology. Keep next scheduled appointment, or sooner if any concerns or worsening symptoms.       Relevant Orders   MR Brain W Wo Contrast   Ambulatory referral to Neurology    No orders of the defined types were placed in this encounter.   Follow-up: Return if symptoms worsen or fail to improve.    Charyl Dancer, NP

## 2021-06-03 ENCOUNTER — Ambulatory Visit: Payer: BC Managed Care – PPO | Admitting: Nurse Practitioner

## 2021-06-03 ENCOUNTER — Other Ambulatory Visit: Payer: Self-pay

## 2021-06-03 ENCOUNTER — Encounter: Payer: Self-pay | Admitting: Nurse Practitioner

## 2021-06-03 VITALS — BP 118/84 | HR 98 | Temp 97.7°F | Wt 286.2 lb

## 2021-06-03 DIAGNOSIS — J069 Acute upper respiratory infection, unspecified: Secondary | ICD-10-CM | POA: Diagnosis not present

## 2021-06-03 DIAGNOSIS — H6690 Otitis media, unspecified, unspecified ear: Secondary | ICD-10-CM

## 2021-06-03 MED ORDER — AMOXICILLIN-POT CLAVULANATE 875-125 MG PO TABS: 1.0000 | ORAL_TABLET | Freq: Two times a day (BID) | ORAL | 0 refills | Status: DC

## 2021-06-03 MED ORDER — PREDNISONE 10 MG PO TABS: ORAL_TABLET | ORAL | 0 refills | Status: DC

## 2021-06-03 NOTE — Patient Instructions (Signed)
It was great to see you! ? ?Start aumgentin (antibiotic) twice a day for 10 days. Start the prednisone taper again, 6 tablets today, 5 tablets tomorrow, then decrease by 1 every other day. Start mucinex over the counter as needed for cough and chest congestion.  ? ?Let's follow-up if your symptoms don't improve or worsen.  ? ?Take care, ? ?Rodman Pickle, NP ? ?

## 2021-06-03 NOTE — Telephone Encounter (Signed)
Called patient and added to my schedule at 11:20 today.  ?

## 2021-06-03 NOTE — Progress Notes (Signed)
? ?Acute Office Visit ? ?Subjective:  ? ? Patient ID: Lauren Burns, female    DOB: 1975-07-03, 46 y.o.   MRN: [id number] ? ?Chief Complaint  ?Patient presents with  ? URI  ?  Pt c/o cough, body aches, headache, and congestion x5 days. Neg at-home covid test.   ? ? ?HPI ?Patient is in today for cough, body aches, and fever since Friday. Home covid-19 tests negative x2 ? ?UPPER RESPIRATORY TRACT INFECTION ? ?Fever: yes ?Cough: yes ?Shortness of breath: no ?Wheezing: yes ?Chest pain: yes, with cough ?Chest tightness: no ?Chest congestion: yes ?Nasal congestion: yes ?Runny nose: yes ?Post nasal drip: no ?Sneezing: yes ?Sore throat: no ?Swollen glands: no ?Sinus pressure: yes ?Headache: yes ?Face pain: no ?Toothache: no ?Ear pain: yes bilateral ?Ear pressure: no bilateral ?Eyes red/itching:no ?Eye drainage/crusting: no  ?Vomiting: no ?Rash: no ?Fatigue: yes ?Sick contacts: no ?Strep contacts: no  ?Context: fluctuating ?Recurrent sinusitis: no ?Relief with OTC cold/cough medications: no  ?Treatments attempted: tylenol, guaifenesin once, claritin, flonase ? ? ?Past Medical History:  ?Diagnosis Date  ? Antiphospholipid antibody syndrome (HCC)   ? Anxiety   ? Arthritis   ? Depression   ? Endometriosis   ? Fatigue   ? Fibromyalgia   ? GERD (gastroesophageal reflux disease)   ? Heart murmur   ? Hypertension   ? IBS (irritable bowel syndrome)   ? Meralgia paresthetica of left side   ? Obesity   ? Pulmonary embolism ([place]) 2004  ? Shortness of breath   ? Sleep apnea   ? Vitamin D deficiency   ? ? ?Past Surgical History:  ?Procedure Laterality Date  ? laprascopic    ? diagnosed endometriosis  ? ? ?Family History  ?Problem Relation Age of Onset  ? Hyperlipidemia Mother   ? Depression Mother   ? Anxiety disorder Mother   ? Obesity Mother   ? Heart disease Father   ? Alcohol abuse Father   ? Hypertension Father   ? Cancer Father   ? Alcoholism Father   ? Obesity Father   ? Diabetes Mellitus I Brother   ? ? ?Social History   ? ?Socioeconomic History  ? Marital status: Married  ?  Spouse name: [name]  ? Number of children: 1  ? Years of education: Not on file  ? Highest education level: Bachelor's degree (e.g., BA, AB, BS)  ?Occupational History  ? Occupation: Audiological scientist  ?Tobacco Use  ? Smoking status: Former  ?  Types: Cigarettes  ?  Quit date: 12/17/2008  ?  Years since quitting: 12.4  ? Smokeless tobacco: Never  ?Vaping Use  ? Vaping Use: Never used  ?Substance and Sexual Activity  ? Alcohol use: Yes  ?  Alcohol/week: 1.0 standard drink  ?  Types: 1 Glasses of wine per week  ? Drug use: No  ? Sexual activity: Not on file  ?Other Topics Concern  ? Not on file  ?Social History Narrative  ? Lives with husband, son  ? ?Social Determinants of Health  ? ?Financial Resource Strain: Not on file  ?Food Insecurity: Not on file  ?Transportation Needs: Not on file  ?Physical Activity: Not on file  ?Stress: Not on file  ?Social Connections: Not on file  ?Intimate Partner Violence: Not on file  ? ? ?Outpatient Medications Prior to Visit  ?Medication Sig Dispense Refill  ? DULoxetine (CYMBALTA) 60 MG capsule Take 1 capsule (60 mg total) by mouth daily. 90 capsule 3  ? furosemide (  LASIX) 20 MG tablet Take 1 tablet (20 mg total) by mouth daily. 90 tablet 3  ? gabapentin (NEURONTIN) 600 MG tablet Take 1 tablet (600 mg total) by mouth 3 (three) times daily. 270 tablet 1  ? losartan-hydrochlorothiazide (HYZAAR) 100-25 MG tablet TAKE 1 TABLET BY MOUTH DAILY 90 tablet 1  ? SUMAtriptan (IMITREX) 25 MG tablet Take 1 tablet (25 mg total) by mouth daily as needed for migraine. May repeat in 2 hours if headache persists or recurs. 10 tablet 0  ? verapamil (CALAN-SR) 240 MG CR tablet Take 1 tablet (240 mg total) by mouth at bedtime. 90 tablet 3  ? warfarin (COUMADIN) 5 MG tablet Take 1 tablet (5 mg total) by mouth daily. 90 tablet 3  ? ?No facility-administered medications prior to visit.  ? ? ?No Known Allergies ? ?Review of Systems ?See pertinent  positives and negatives per HPI. ?   ?Objective:  ?  ?Physical Exam ?Vitals and nursing note reviewed.  ?Constitutional:   ?   General: She is not in acute distress. ?   Appearance: Normal appearance.  ?HENT:  ?   Head: Normocephalic.  ?   Right Ear: Tympanic membrane, ear canal and external ear normal.  ?   Left Ear: Ear canal and external ear normal. Tympanic membrane is erythematous.  ?Eyes:  ?   Conjunctiva/sclera: Conjunctivae normal.  ?Cardiovascular:  ?   Rate and Rhythm: Normal rate and regular rhythm.  ?   Pulses: Normal pulses.  ?   Heart sounds: Normal heart sounds.  ?Pulmonary:  ?   Effort: Pulmonary effort is normal.  ?   Breath sounds: Normal breath sounds.  ?Musculoskeletal:  ?   Cervical back: Normal range of motion and neck supple. No tenderness.  ?Lymphadenopathy:  ?   Cervical: No cervical adenopathy.  ?Skin: ?   General: Skin is warm.  ?Neurological:  ?   General: No focal deficit present.  ?   Mental Status: She is alert and oriented to person, place, and time.  ?Psychiatric:     ?   Mood and Affect: Mood normal.     ?   Behavior: Behavior normal.     ?   Thought Content: Thought content normal.     ?   Judgment: Judgment normal.  ? ? ?BP 118/84   Pulse 98   Temp 97.7 ?F (36.5 ?C) (Temporal)   Wt 286 lb 3.2 oz (129.8 kg)   LMP 12/21/2016   SpO2 95%   BMI 42.88 kg/m?  ?Wt Readings from Last 3 Encounters:  ?06/03/21 286 lb 3.2 oz (129.8 kg)  ?05/26/21 288 lb (130.6 kg)  ?05/05/21 289 lb 6.4 oz (131.3 kg)  ? ? ?Health Maintenance Due  ?Topic Date Due  ? PAP SMEAR-Modifier  Never done  ? COLONOSCOPY (Pts 45-31yrs Insurance coverage will need to be confirmed)  Never done  ? INFLUENZA VACCINE  10/28/2020  ? COVID-19 Vaccine (4 - Booster for Pfizer series) 05/31/2021  ? ? ?There are no preventive care reminders to display for this patient. ? ? ?Lab Results  ?Component Value Date  ? TSH 2.75 05/05/2021  ? ?Lab Results  ?Component Value Date  ? WBC 7.7 05/05/2021  ? HGB 13.5 05/05/2021  ? HCT 41.6  05/05/2021  ? MCV 90.2 05/05/2021  ? PLT 267.0 05/05/2021  ? ?Lab Results  ?Component Value Date  ? NA 136 05/05/2021  ? K 4.2 05/05/2021  ? CO2 30 05/05/2021  ? GLUCOSE 90 05/05/2021  ?  BUN 20 05/05/2021  ? CREATININE 0.71 05/05/2021  ? BILITOT 0.4 05/05/2021  ? ALKPHOS 77 05/05/2021  ? AST 15 05/05/2021  ? ALT 13 05/05/2021  ? PROT 7.3 05/05/2021  ? ALBUMIN 4.4 05/05/2021  ? CALCIUM 9.6 05/05/2021  ? GFR 101.91 05/05/2021  ? ?Lab Results  ?Component Value Date  ? CHOL 274 (H) 05/05/2021  ? ?Lab Results  ?Component Value Date  ? HDL 62.30 05/05/2021  ? ?Lab Results  ?Component Value Date  ? LDLCALC 193 (H) 05/05/2021  ? ?Lab Results  ?Component Value Date  ? TRIG 93.0 05/05/2021  ? ?Lab Results  ?Component Value Date  ? CHOLHDL 4 05/05/2021  ? ?Lab Results  ?Component Value Date  ? HGBA1C 5.5 05/05/2021  ? ? ?   ?Assessment & Plan:  ? ?Problem List Items Addressed This Visit   ?None ?Visit Diagnoses   ? ? Upper respiratory tract infection, unspecified type    -  Primary  ? Most likely viral. Home covid test neg, outside treatment window for flu.With wheezing will treat with prednisone taper. Mucinex prn cough/congestion  ? Acute otitis media, unspecified otitis media type      ? Treat with augmentin BID x10 days. Can take tylenol prn pain. F/U if not improving.   ? Relevant Medications  ? amoxicillin-clavulanate (AUGMENTIN) 875-125 MG tablet  ? ?  ? ? ? ?Meds ordered this encounter  ?Medications  ? predniSONE (DELTASONE) 10 MG tablet  ?  Sig: Take 6 tablets today, then 5 tablets tomorrow, then decrease by 1 tablet every day until gone  ?  Dispense:  21 tablet  ?  Refill:  0  ? amoxicillin-clavulanate (AUGMENTIN) 875-125 MG tablet  ?  Sig: Take 1 tablet by mouth 2 (two) times daily.  ?  Dispense:  20 tablet  ?  Refill:  0  ? ? ? ?Charyl Dancer, NP ? ?

## 2021-06-11 ENCOUNTER — Ambulatory Visit
Admission: RE | Admit: 2021-06-11 | Discharge: 2021-06-11 | Disposition: A | Discharge status: Home / Self Care | Payer: BC Managed Care – PPO | Source: Ambulatory Visit | Attending: Nurse Practitioner | Admitting: Nurse Practitioner

## 2021-06-11 DIAGNOSIS — G8929 Other chronic pain: Secondary | ICD-10-CM

## 2021-06-11 DIAGNOSIS — R519 Headache, unspecified: Secondary | ICD-10-CM | POA: Diagnosis not present

## 2021-06-11 DIAGNOSIS — H538 Other visual disturbances: Secondary | ICD-10-CM | POA: Diagnosis not present

## 2021-06-11 IMAGING — MR MR HEAD WO/W CM
14 series · 48 of 48 positions shown · IV contrast (multihance)
Comparison: None.

CLINICAL DATA: Headaches, blurry vision at night

EXAM:
MRI HEAD WITHOUT AND WITH CONTRAST
TECHNIQUE: Multiplanar, multiecho pulse sequences of the brain and surrounding
structures were obtained without and with intravenous contrast.
CONTRAST:  20mL MULTIHANCE GADOBENATE DIMEGLUMINE 529 MG/ML IV SOLN

[Series 5: T1 · sagittal · 4.0mm · 0.75mm/px · 2 of 31 slices shown (1 of 3)]
[im 1/31]
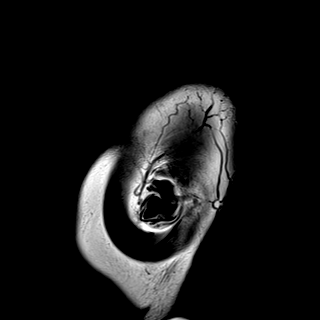
[im 31/31]
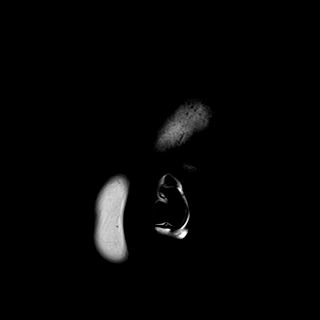

[Series 6: DWI · axial · 3.0mm · 0.94mm/px · z∈[-68,+76]mm · 8 of 160 slices shown (1 of 3)]
[im 1/160]
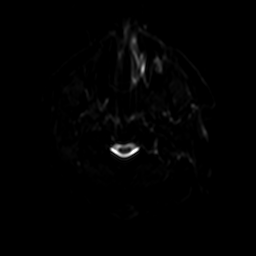
[im 23/160]
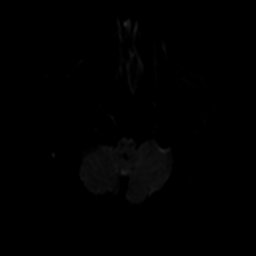
[im 46/160]
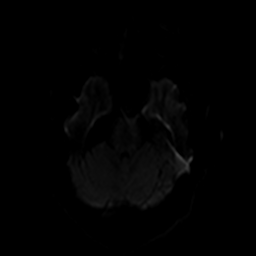
[im 69/160]
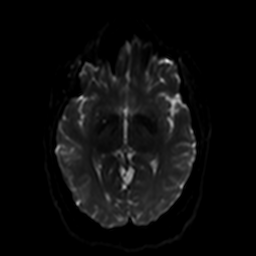
[im 91/160]
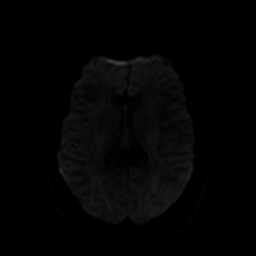
[im 114/160]
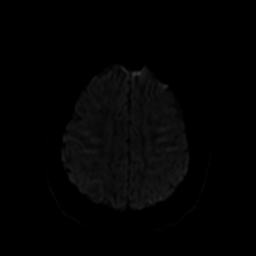
[im 137/160]
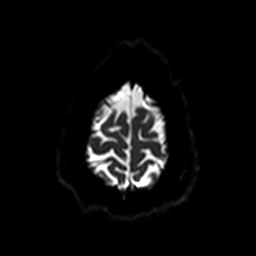
[im 160/160]
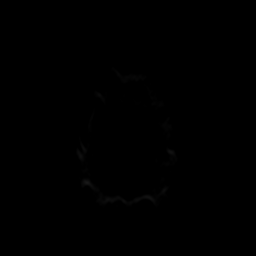

[Series 7: ax dwi_tracew · axial · 3.0mm · 0.94mm/px · z∈[-68,+76]mm · 4 of 80 slices shown]
[im 1/80]
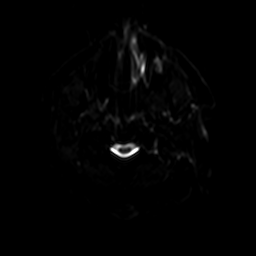
[im 27/80]
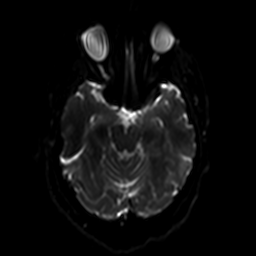
[im 53/80]
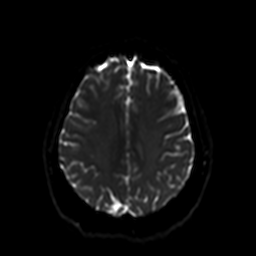
[im 80/80]
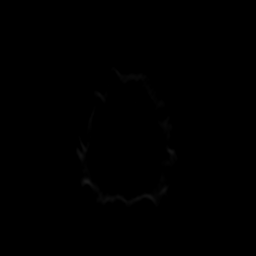

[Series 8: ax dwi_adc · axial · 3.0mm · 0.94mm/px · z∈[-68,+76]mm · 2 of 40 slices shown]
[im 1/40]
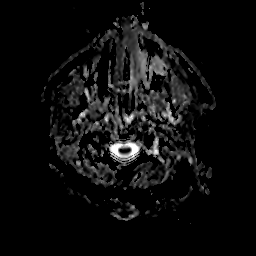
[im 40/40]
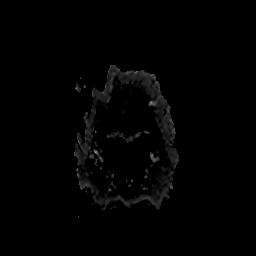

[Series 9: DWI · coronal · 5.0mm · 1.44mm/px · 3 of 60 slices shown (2 of 3)]
[im 1/60]
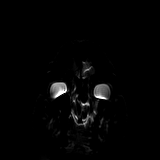
[im 30/60]
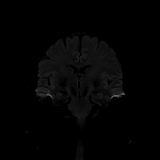
[im 60/60]
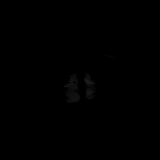

[Series 10: DWI · coronal · 5.0mm · 1.44mm/px · 1 of 30 slices shown (3 of 3)]
[im 1/30]
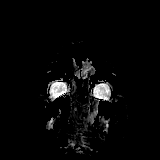

[Series 11: T2 · axial · 4.0mm · 0.36mm/px · 1 of 27 slices shown]
[im 1/27]
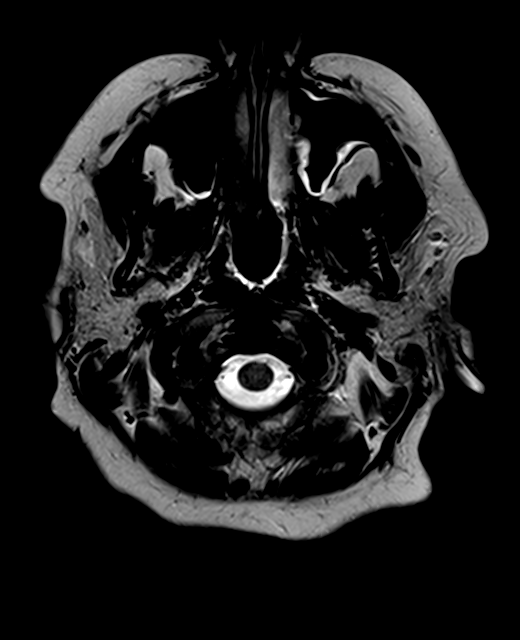

[Series 12: FLAIR · axial · 3.0mm · 0.72mm/px · 1 of 26 slices shown]
[im 1/26]
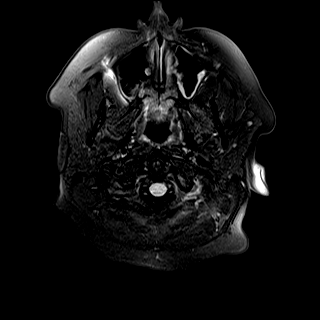

[Series 15: swi_images · axial · 2.3mm · 0.90mm/px · z∈[-65,+78]mm · 3 of 64 slices shown]
[im 1/64]
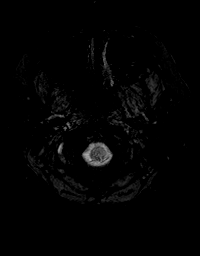
[im 32/64]
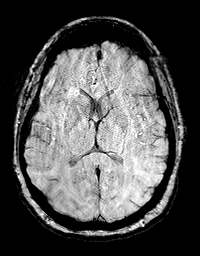
[im 64/64]
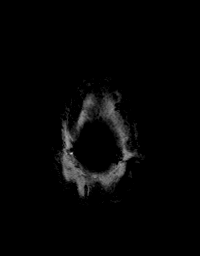

[Series 16: mip_images(sw) · axial · 18.4mm · 0.90mm/px · z∈[-57,+70]mm · 3 of 57 slices shown]
[im 1/57]
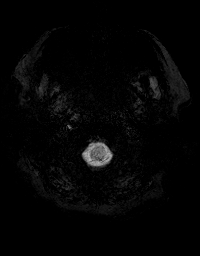
[im 29/57]
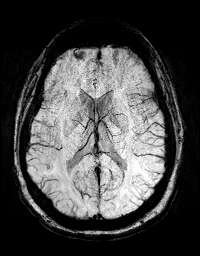
[im 57/57]
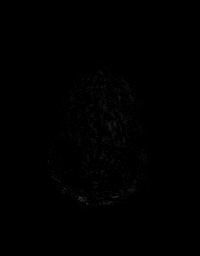

[Series 17: T1 · axial · 1.0mm · 0.94mm/px · z∈[-70,+87]mm · 8 of 160 slices shown (2 of 3)]
[im 1/160]
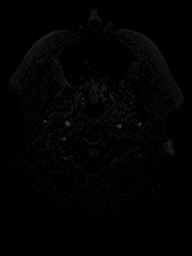
[im 23/160]
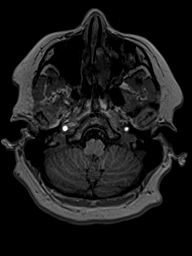
[im 46/160]
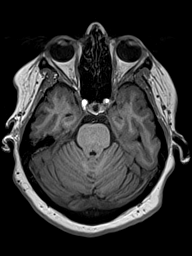
[im 69/160]
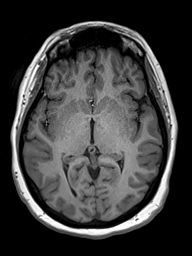
[im 91/160]
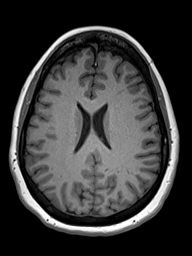
[im 114/160]
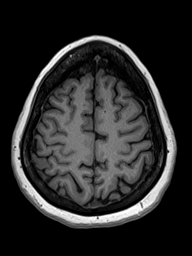
[im 137/160]
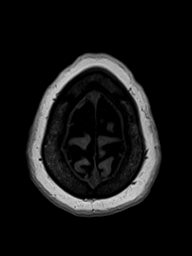
[im 160/160]
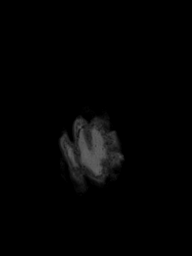

[Series 18: T2 post-contrast · coronal · 4.0mm · 0.36mm/px · 2 of 35 slices shown]
[im 1/35]
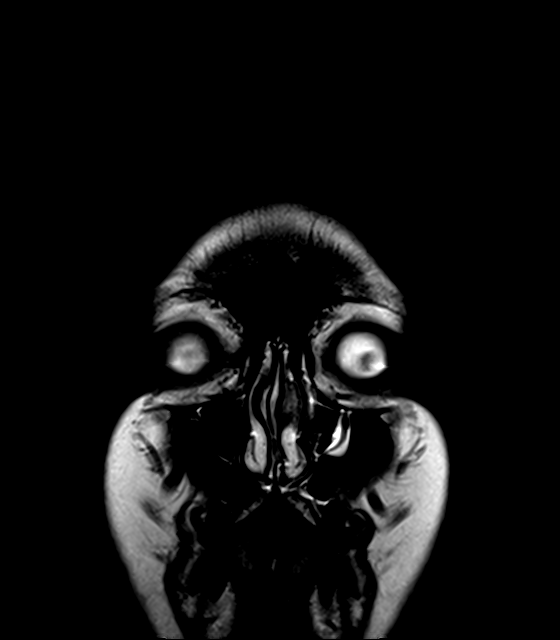
[im 35/35]
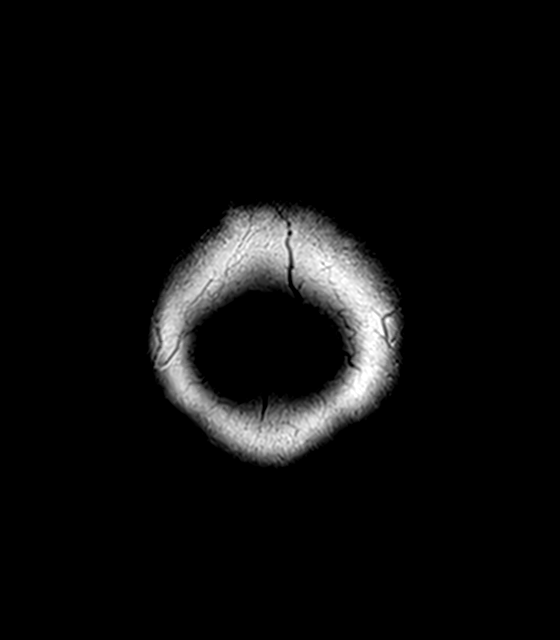

[Series 19: T1 · axial · 1.0mm · 0.94mm/px · z∈[-70,+87]mm · 8 of 160 slices shown (3 of 3)]
[im 1/160]
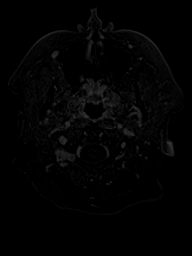
[im 23/160]
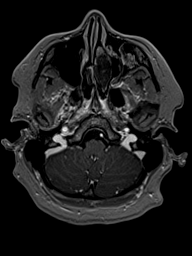
[im 46/160]
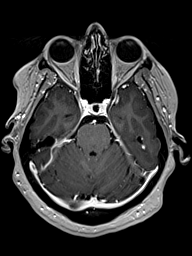
[im 69/160]
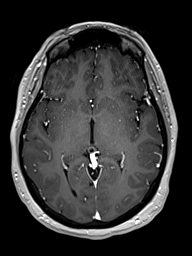
[im 91/160]
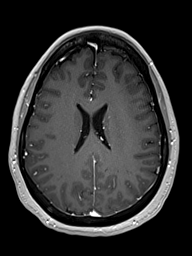
[im 114/160]
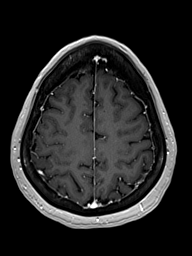
[im 137/160]
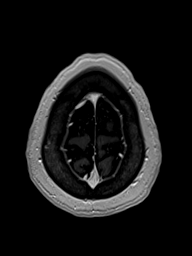
[im 160/160]
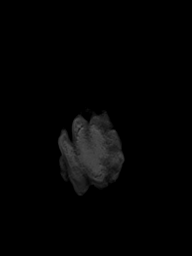

[Series 20: T1 post-contrast · coronal · 4.0mm · 0.72mm/px · 2 of 35 slices shown]
[im 1/35]
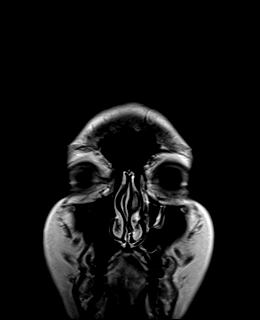
[im 35/35]
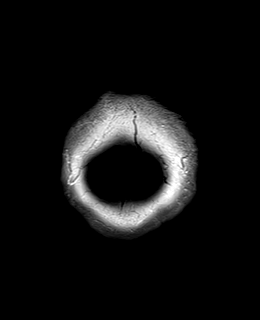

[48 of 48 positions shown; findings below may reference images not displayed]

FINDINGS: Brain: No restricted diffusion to suggest acute or subacute infarct.
No acute hemorrhage, mass, mass effect, or midline shift. No
hydrocephalus or extra-axial collection. No abnormal parenchymal or
meningeal enhancement. No abnormal T2 hyperintense signal in the
periventricular white matter. Partial empty sella.

Vascular: Normal flow voids. Possible narrowing of the distal
transverse sinuses, near the transverse sigmoid junction.

Skull and upper cervical spine: Normal marrow signal.

Sinuses/Orbits: Mucosal thickening in the left-greater-than-right
maxillary sinus and bilateral ethmoid air cells. The orbits are
unremarkable.

Other: Trace fluid in the left-greater-than-right mastoid air cells.
IMPRESSION: 1. Partial empty sella in the suggestion of distal transverse sinus
narrowing, which are nonspecific but can be seen in the setting of
idiopathic intracranial hypertension. Correlate with symptoms and
opening pressure.
2. No other acute intracranial process.

## 2021-06-11 MED ORDER — GADOBENATE DIMEGLUMINE 529 MG/ML IV SOLN
20.0000 mL | Freq: Once | INTRAVENOUS | Status: AC | PRN
  Administered 2021-06-11: 20 mL via INTRAVENOUS

## 2021-06-25 ENCOUNTER — Encounter: Payer: Self-pay | Admitting: Nurse Practitioner

## 2021-06-25 DIAGNOSIS — G8929 Other chronic pain: Secondary | ICD-10-CM

## 2021-06-25 MED ORDER — GABAPENTIN 600 MG PO TABS: 600.0000 mg | ORAL_TABLET | Freq: Three times a day (TID) | ORAL | 1 refills | Status: DC

## 2021-07-03 NOTE — Patient Instructions (Incomplete)
It was great to see you! ? ?*** ? ?Let's follow-up in *** ***, sooner if you have concerns. ? ?If a referral was placed today, you will be contacted for an appointment. Please note that routine referrals can sometimes take up to 3-4 weeks to process. Please call our office if you haven't heard anything after this time frame. ? ?Take care, ? ?Vance Peper, NP ? ?

## 2021-07-03 NOTE — Progress Notes (Deleted)
? ?LMP 12/21/2016   ? ?Subjective:  ? ? Patient ID: Lauren Burns, female    DOB: 12/31/1975, 46 y.o.   MRN: [id number] ? ?CC: ?No chief complaint on file. ? ?HPI: ?Lauren Burns is a 46 y.o. female presenting on 07/07/2021 for comprehensive medical examination. Current medical complaints include:{Blank single:19197::"none","***"} ? ?She currently lives with: ?Menopausal Symptoms: {Blank single:19197::"yes","no"} ? ?Depression Screen done today and results listed below:  ? ?  05/05/2021  ?  1:55 PM 03/01/2018  ?  5:15 PM 11/12/2016  ? 10:10 AM  ?Depression screen PHQ 2/9  ?Decreased Interest 2 2 1   ?Down, Depressed, Hopeless 2 1 1   ?PHQ - 2 Score 4 3 2   ?Altered sleeping 1 0 2  ?Tired, decreased energy 3 2 3   ?Change in appetite 0 0 1  ?Feeling bad or failure about yourself  0 1 1  ?Trouble concentrating 1 1 1   ?Moving slowly or fidgety/restless 0 0 0  ?Suicidal thoughts 0 0   ?PHQ-9 Score 9 7 10   ?Difficult doing work/chores Somewhat difficult    ? ? ?The patient {has/does not have:19849} a history of falls. I {did/did not:19850} complete a risk assessment for falls. A plan of care for falls {was/was not:19852} documented. ? ? ?Past Medical History:  ?Past Medical History:  ?Diagnosis Date  ? Antiphospholipid antibody syndrome (HCC)   ? Anxiety   ? Arthritis   ? Depression   ? Endometriosis   ? Fatigue   ? Fibromyalgia   ? GERD (gastroesophageal reflux disease)   ? Heart murmur   ? Hypertension   ? IBS (irritable bowel syndrome)   ? Meralgia paresthetica of left side   ? Obesity   ? Pulmonary embolism ([facility]) 2004  ? Shortness of breath   ? Sleep apnea   ? Vitamin D deficiency   ? ? ?Surgical History:  ?Past Surgical History:  ?Procedure Laterality Date  ? laprascopic    ? diagnosed endometriosis  ? ? ?Medications:  ?Current Outpatient Medications on File Prior to Visit  ?Medication Sig  ? amoxicillin-clavulanate (AUGMENTIN) 875-125 MG tablet Take 1 tablet by mouth 2 (two) times daily.  ? DULoxetine (CYMBALTA) 60 MG  capsule Take 1 capsule (60 mg total) by mouth daily.  ? furosemide (LASIX) 20 MG tablet Take 1 tablet (20 mg total) by mouth daily.  ? gabapentin (NEURONTIN) 600 MG tablet Take 1 tablet (600 mg total) by mouth 3 (three) times daily.  ? losartan-hydrochlorothiazide (HYZAAR) 100-25 MG tablet TAKE 1 TABLET BY MOUTH DAILY  ? predniSONE (DELTASONE) 10 MG tablet Take 6 tablets today, then 5 tablets tomorrow, then decrease by 1 tablet every day until gone  ? SUMAtriptan (IMITREX) 25 MG tablet Take 1 tablet (25 mg total) by mouth daily as needed for migraine. May repeat in 2 hours if headache persists or recurs.  ? verapamil (CALAN-SR) 240 MG CR tablet Take 1 tablet (240 mg total) by mouth at bedtime.  ? warfarin (COUMADIN) 5 MG tablet Take 1 tablet (5 mg total) by mouth daily.  ? ?No current facility-administered medications on file prior to visit.  ? ? ?Allergies:  ?No Known Allergies ? ?Social History:  ?Social History  ? ?Socioeconomic History  ? Marital status: Married  ?  Spouse name:  ? Number of children: 1  ? Years of education: Not on file  ? Highest education level: Bachelor's degree (e.g., BA, AB, BS)  ?Occupational History  ? Occupation:  ?Tobacco Use  ?  Smoking status: Former  ?  Types: Cigarettes  ?  Quit date: 12/17/2008  ?  Years since quitting: 12.5  ? Smokeless tobacco: Never  ?Vaping Use  ? Vaping Use: Never used  ?Substance and Sexual Activity  ? Alcohol use: Yes  ?  Alcohol/week: 1.0 standard drink  ?  Types: 1 Glasses of wine per week  ? Drug use: No  ? Sexual activity: Not on file  ?Other Topics Concern  ? Not on file  ?Social History Narrative  ? Lives with husband, son  ? ?Social Determinants of Health  ? ?Financial Resource Strain: Not on file  ?Food Insecurity: Not on file  ?Transportation Needs: Not on file  ?Physical Activity: Not on file  ?Stress: Not on file  ?Social Connections: Not on file  ?Intimate Partner Violence: Not on file  ? ?Social History  ? ?Tobacco Use   ?Smoking Status Former  ? Types: Cigarettes  ? Quit date: 12/17/2008  ? Years since quitting: 12.5  ?Smokeless Tobacco Never  ? ?Social History  ? ?Substance and Sexual Activity  ?Alcohol Use Yes  ? Alcohol/week: 1.0 standard drink  ? Types: 1 Glasses of wine per week  ? ? ?Family History:  ?Family History  ?Problem Relation Age of Onset  ? Hyperlipidemia Mother   ? Depression Mother   ? Anxiety disorder Mother   ? Obesity Mother   ? Heart disease Father   ? Alcohol abuse Father   ? Hypertension Father   ? Cancer Father   ? Alcoholism Father   ? Obesity Father   ? Diabetes Mellitus I Brother   ? ? ?Past medical history, surgical history, medications, allergies, family history and social history reviewed with patient today and changes made to appropriate areas of the chart.  ? ?ROS ?All other ROS negative except what is listed above and in the HPI.  ? ?   ?Objective:  ?  ?LMP 12/21/2016   ?Wt Readings from Last 3 Encounters:  ?06/03/21 286 lb 3.2 oz (129.8 kg)  ?05/26/21 288 lb (130.6 kg)  ?05/05/21 289 lb 6.4 oz (131.3 kg)  ?  ?Physical Exam ? ?Results for orders placed or performed in visit on 05/05/21  ?CBC with Differential/Platelet  ?Result Value Ref Range  ? WBC 7.7 4.0 - 10.5 K/uL  ? RBC 4.62 3.87 - 5.11 Mil/uL  ? Hemoglobin 13.5 12.0 - 15.0 g/dL  ? HCT 41.6 36.0 - 46.0 %  ? MCV 90.2 78.0 - 100.0 fl  ? MCHC 32.4 30.0 - 36.0 g/dL  ? RDW 14.1 11.5 - 15.5 %  ? Platelets 267.0 150.0 - 400.0 K/uL  ? Neutrophils Relative % 58.4 43.0 - 77.0 %  ? Lymphocytes Relative 31.1 12.0 - 46.0 %  ? Monocytes Relative 6.0 3.0 - 12.0 %  ? Eosinophils Relative 3.9 0.0 - 5.0 %  ? Basophils Relative 0.6 0.0 - 3.0 %  ? Neutro Abs 4.5 1.4 - 7.7 K/uL  ? Lymphs Abs 2.4 0.7 - 4.0 K/uL  ? Monocytes Absolute 0.5 0.1 - 1.0 K/uL  ? Eosinophils Absolute 0.3 0.0 - 0.7 K/uL  ? Basophils Absolute 0.0 0.0 - 0.1 K/uL  ?Comprehensive metabolic panel  ?Result Value Ref Range  ? Sodium 136 135 - 145 mEq/L  ? Potassium 4.2 3.5 - 5.1 mEq/L  ? Chloride  98 96 - 112 mEq/L  ? CO2 30 19 - 32 mEq/L  ? Glucose, Bld 90 70 - 99 mg/dL  ? BUN 20 6 -  23 mg/dL  ? Creatinine, Ser 0.71 0.40 - 1.20 mg/dL  ? Total Bilirubin 0.4 0.2 - 1.2 mg/dL  ? Alkaline Phosphatase 77 39 - 117 U/L  ? AST 15 0 - 37 U/L  ? ALT 13 0 - 35 U/L  ? Total Protein 7.3 6.0 - 8.3 g/dL  ? Albumin 4.4 3.5 - 5.2 g/dL  ? GFR 101.91 >60.00 mL/min  ? Calcium 9.6 8.4 - 10.5 mg/dL  ?Hepatitis C antibody  ?Result Value Ref Range  ? Hepatitis C Ab NON-REACTIVE NON-REACTIVE  ? SIGNAL TO CUT-OFF <0.02 <1.00  ?Hemoglobin A1c  ?Result Value Ref Range  ? Hgb A1c MFr Bld 5.5 4.6 - 6.5 %  ?HIV Antibody (routine testing w rflx)  ?Result Value Ref Range  ? HIV 1&2 Ab, 4th Generation NON-REACTIVE NON-REACTIVE  ?Lipid panel  ?Result Value Ref Range  ? Cholesterol 274 (H) 0 - 200 mg/dL  ? Triglycerides 93.0 0.0 - 149.0 mg/dL  ? HDL 62.30 >39.00 mg/dL  ? VLDL 18.6 0.0 - 40.0 mg/dL  ? LDL Cholesterol 193 (H) 0 - 99 mg/dL  ? Total CHOL/HDL Ratio 4   ? NonHDL 212.08   ?TSH  ?Result Value Ref Range  ? TSH 2.75 0.35 - 5.50 uIU/mL  ?VITAMIN D 25 Hydroxy (Vit-D Deficiency, Fractures)  ?Result Value Ref Range  ? VITD 26.02 (L) 30.00 - 100.00 ng/mL  ?Protime-INR  ?Result Value Ref Range  ? INR 2.0 (H) 0.8 - 1.0 ratio  ? Prothrombin Time 20.8 (H) 9.6 - 13.1 sec  ? ?   ?Assessment & Plan:  ? ?Problem List Items Addressed This Visit   ?None ?  ? ?Follow up plan: ?No follow-ups on file. ? ? ?LABORATORY TESTING:  ?- Pap smear: {Blank single:19197::"pap done","not applicable","up to date","done elsewhere"} ? ?IMMUNIZATIONS:   ?- Tdap: Tetanus vaccination status reviewed: last tetanus booster within 10 years. ?- Influenza: Postponed to flu season ?- Pneumovax: Not applicable ?- Prevnar: Not applicable ?- HPV: Not applicable ?- Zostavax vaccine: Not applicable ? ?SCREENING: ?-Mammogram: Up to date  ?- Colonoscopy: {Blank single:19197::"Up to date","Ordered today","Not applicable","Refused","Done elsewhere"}  ?- Bone Density: Not applicable   ?-Hearing Test: Not applicable  ?-Spirometry: Not applicable  ? ?PATIENT COUNSELING:   ?Advised to take 1 mg of folate supplement per day if capable of pregnancy.  ? ?Sexuality: Discussed sexually transm

## 2021-07-07 ENCOUNTER — Encounter: Payer: BC Managed Care – PPO | Admitting: Nurse Practitioner

## 2021-07-10 ENCOUNTER — Encounter: Payer: Self-pay | Admitting: Nurse Practitioner

## 2021-07-28 ENCOUNTER — Encounter: Payer: Self-pay | Admitting: Nurse Practitioner

## 2021-07-28 ENCOUNTER — Ambulatory Visit: Payer: BC Managed Care – PPO | Admitting: Nurse Practitioner

## 2021-07-28 VITALS — BP 130/90 | HR 70 | Temp 96.9°F | Wt 302.4 lb

## 2021-07-28 DIAGNOSIS — Z7901 Long term (current) use of anticoagulants: Secondary | ICD-10-CM | POA: Diagnosis not present

## 2021-07-28 LAB — POCT INR: INR: 2.8 (ref 2.0–3.0)

## 2021-07-28 NOTE — Patient Instructions (Signed)
It was great to see you! ? ?Continue your coumadin 5mg  daily.  ? ?Let's follow-up in 2 months, sooner if you have concerns. ? ?If a referral was placed today, you will be contacted for an appointment. Please note that routine referrals can sometimes take up to 3-4 weeks to process. Please call our office if you haven't heard anything after this time frame. ? ?Take care, ? ? , NP ? ?

## 2021-07-28 NOTE — Progress Notes (Signed)
? ?  BP 130/90 (BP Location: Right Arm, Cuff Size: Large)   Pulse 70   Temp (!) 96.9 ?F (36.1 ?C) (Temporal)   Wt (!) 302 lb 6.4 oz (137.2 kg)   LMP 12/21/2016   SpO2 99%   BMI 45.31 kg/m?   ? ?Subjective:  ? ? Patient ID: Lauren Burns, female    DOB: 06-11-1975, 46 y.o.   MRN: [id number] ? ?CC: Coumadin management ? ?HPI: This patient is a 46 y.o. female who presents for coumadin management. The expected duration of coumadin treatment is lifelong The reason for anticoagulation is  Factor V Leiden. ? ?Present Coumadin dose: 5mg  daily ?Goal: 2.0-3.0  ?Excessive bruising: no ?Nose bleeding: no ?Rectal bleeding: no ?Prolonged menstrual cycles: N/A ?Eating diet with consistent amounts of foods containing Vitamin K:yes ?Any recent antibiotic use? no ? ?Relevant past medical, surgical, family and social history reviewed and updated as indicated. Interim medical history since our last visit reviewed. ?Allergies and medications reviewed and updated. ? ?ROS: Per HPI unless specifically indicated above ? ?   ?Objective:  ?  ?BP 130/90 (BP Location: Right Arm, Cuff Size: Large)   Pulse 70   Temp (!) 96.9 ?F (36.1 ?C) (Temporal)   Wt (!) 302 lb 6.4 oz (137.2 kg)   LMP 12/21/2016   SpO2 99%   BMI 45.31 kg/m?   ?Wt Readings from Last 3 Encounters:  ?07/28/21 (!) 302 lb 6.4 oz (137.2 kg)  ?06/03/21 286 lb 3.2 oz (129.8 kg)  ?05/26/21 288 lb (130.6 kg)  ?  ? ?General: Well appearing, well nourished in no distress.  Normal mood and affect. ?Skin: No excessive bruising or rash ? ?Last INR: 2.0% ? ?  ?Last CBC:  ?Lab Results  ?Component Value Date  ? WBC 7.7 05/05/2021  ? HGB 13.5 05/05/2021  ? HCT 41.6 05/05/2021  ? MCV 90.2 05/05/2021  ? PLT 267.0 05/05/2021  ? ? ?Results for orders placed or performed in visit on 07/28/21  ?POCT INR  ?Result Value Ref Range  ? INR 2.8 2.0 - 3.0  ? ?   ? ?Assessment:  ? ?  ICD-10-CM   ?1. Long term current use of anticoagulants with INR goal of 2.0-3.0  Z79.01 POCT INR  ?  ? ? ?Plan:   ? ?Discussed current plan face-to-face with patient. For coumadin dosing, elected to continue current dose. Will plan to recheck INR in  2 months .  ?

## 2021-07-29 DIAGNOSIS — F331 Major depressive disorder, recurrent, moderate: Secondary | ICD-10-CM | POA: Diagnosis not present

## 2021-07-29 DIAGNOSIS — F5105 Insomnia due to other mental disorder: Secondary | ICD-10-CM | POA: Diagnosis not present

## 2021-07-29 DIAGNOSIS — F900 Attention-deficit hyperactivity disorder, predominantly inattentive type: Secondary | ICD-10-CM | POA: Diagnosis not present

## 2021-07-29 DIAGNOSIS — F411 Generalized anxiety disorder: Secondary | ICD-10-CM | POA: Diagnosis not present

## 2021-08-04 NOTE — Telephone Encounter (Signed)
-----   Message from Gerre Scull, NP sent at 08/03/2021  2:55 PM EDT ----- ?Can you call Lauren Burns and follow-up on the Cologuard that was ordered?  ? ?----- Message ----- ?From: SYSTEM ?Sent: 08/03/2021  12:14 AM EDT ?To: Gerre Scull, NP ? ? ?

## 2021-08-04 NOTE — Telephone Encounter (Signed)
Called and ldvm for pt to cb regarding receipt of cologuard kit. Sw, cma ?

## 2021-08-18 ENCOUNTER — Other Ambulatory Visit: Payer: Self-pay

## 2021-08-18 DIAGNOSIS — I10 Essential (primary) hypertension: Secondary | ICD-10-CM

## 2021-08-18 MED ORDER — VERAPAMIL HCL ER 240 MG PO TBCR: 240.0000 mg | EXTENDED_RELEASE_TABLET | Freq: Every day | ORAL | 3 refills | Status: DC

## 2021-08-18 NOTE — Telephone Encounter (Signed)
Refill request for  Verapamil Er 20 mg  LR 09/11/20, #90 3 rf LOV5/1/23 FOV 09/29/21  Please review and advise.  Thanks.  Dm/cma

## 2021-08-26 DIAGNOSIS — F99 Mental disorder, not otherwise specified: Secondary | ICD-10-CM | POA: Diagnosis not present

## 2021-08-26 DIAGNOSIS — F331 Major depressive disorder, recurrent, moderate: Secondary | ICD-10-CM | POA: Diagnosis not present

## 2021-08-26 DIAGNOSIS — F5105 Insomnia due to other mental disorder: Secondary | ICD-10-CM | POA: Diagnosis not present

## 2021-08-26 DIAGNOSIS — F411 Generalized anxiety disorder: Secondary | ICD-10-CM | POA: Diagnosis not present

## 2021-09-29 ENCOUNTER — Encounter: Payer: BC Managed Care – PPO | Admitting: Nurse Practitioner

## 2021-10-13 ENCOUNTER — Encounter: Payer: BC Managed Care – PPO | Admitting: Nurse Practitioner

## 2021-10-23 ENCOUNTER — Encounter: Payer: Self-pay | Admitting: Nurse Practitioner

## 2021-10-23 DIAGNOSIS — Z86718 Personal history of other venous thrombosis and embolism: Secondary | ICD-10-CM

## 2021-10-23 MED ORDER — WARFARIN SODIUM 5 MG PO TABS: 5.0000 mg | ORAL_TABLET | Freq: Every day | ORAL | 0 refills | Status: DC

## 2021-11-19 NOTE — Progress Notes (Unsigned)
   LMP 12/21/2016    Subjective:    Patient ID: Lauren Burns, female    DOB: 02-Jun-1975, 46 y.o.   MRN: [id number]  CC: Coumadin management  HPI: This patient is a 46 y.o. female who presents for coumadin management. The expected duration of coumadin treatment is lifelong The reason for anticoagulation is   Antiphospholipid antibody syndrome .  Present Coumadin dose: 5mg  daily Goal: 2.0-3.0  Excessive bruising: {Blank single:19197::"yes","no"} Nose bleeding: {Blank single:19197::"yes","no"} Rectal bleeding: {Blank single:19197::"yes","no"} Prolonged menstrual cycles: {Blank single:19197::"yes","no","N/A"} Eating diet with consistent amounts of foods containing Vitamin K:{Blank single:19197::"yes","no"} Any recent antibiotic use? {Blank single:19197::"yes","no"}  Relevant past medical, surgical, family and social history reviewed and updated as indicated. Interim medical history since our last visit reviewed. Allergies and medications reviewed and updated.  ROS: Per HPI unless specifically indicated above     Objective:    LMP 12/21/2016   Wt Readings from Last 3 Encounters:  07/28/21 (!) 302 lb 6.4 oz (137.2 kg)  06/03/21 286 lb 3.2 oz (129.8 kg)  05/26/21 288 lb (130.6 kg)     General: Well appearing, well nourished in no distress.  Normal mood and affect. Skin: No excessive bruising or rash  Last INR:     Last CBC:  Lab Results  Component Value Date   WBC 7.7 05/05/2021   HGB 13.5 05/05/2021   HCT 41.6 05/05/2021   MCV 90.2 05/05/2021   PLT 267.0 05/05/2021    Results for orders placed or performed in visit on 07/28/21  POCT INR  Result Value Ref Range   INR 2.8 2.0 - 3.0       Assessment:   No diagnosis found.  Plan:   Discussed current plan face-to-face with patient. For coumadin dosing, elected to {Blank single:19197::"continue current dose","change dose to","hold dose"}. Will plan to recheck INR in {Blank single:19197::"1 month","1 week","2  weeks"}.

## 2021-11-20 ENCOUNTER — Ambulatory Visit: Payer: BC Managed Care – PPO | Admitting: Nurse Practitioner

## 2021-11-20 ENCOUNTER — Encounter: Payer: Self-pay | Admitting: Nurse Practitioner

## 2021-11-20 VITALS — BP 134/98 | HR 77 | Temp 97.6°F | Wt 323.6 lb

## 2021-11-20 DIAGNOSIS — I10 Essential (primary) hypertension: Secondary | ICD-10-CM

## 2021-11-20 DIAGNOSIS — R609 Edema, unspecified: Secondary | ICD-10-CM | POA: Diagnosis not present

## 2021-11-20 DIAGNOSIS — D6861 Antiphospholipid syndrome: Secondary | ICD-10-CM

## 2021-11-20 DIAGNOSIS — Z7901 Long term (current) use of anticoagulants: Secondary | ICD-10-CM

## 2021-11-20 DIAGNOSIS — I8390 Asymptomatic varicose veins of unspecified lower extremity: Secondary | ICD-10-CM

## 2021-11-20 DIAGNOSIS — F32A Depression, unspecified: Secondary | ICD-10-CM

## 2021-11-20 DIAGNOSIS — F419 Anxiety disorder, unspecified: Secondary | ICD-10-CM

## 2021-11-20 LAB — POCT INR: INR: 1.5 — AB (ref 2.0–3.0)

## 2021-11-20 MED ORDER — DULOXETINE HCL 60 MG PO CPEP
60.0000 mg | ORAL_CAPSULE | Freq: Every day | ORAL | 3 refills | Status: DC
Start: 2021-11-20 — End: 2021-11-20

## 2021-11-20 MED ORDER — DULOXETINE HCL 60 MG PO CPEP: 60.0000 mg | ORAL_CAPSULE | Freq: Two times a day (BID) | ORAL | 1 refills | Status: DC

## 2021-11-20 MED ORDER — FUROSEMIDE 20 MG PO TABS: 20.0000 mg | ORAL_TABLET | Freq: Every day | ORAL | 3 refills | Status: DC

## 2021-11-20 MED ORDER — WARFARIN SODIUM 5 MG PO TABS: 5.0000 mg | ORAL_TABLET | Freq: Every day | ORAL | 0 refills | Status: DC

## 2021-11-20 NOTE — Patient Instructions (Addendum)
It was great to see you!  Increase your duloxetine to 2 capsules daily (or 1 twice a day).   Keep taking the same dose of your coumadin.   Let's follow-up in 3 weeks, sooner if you have concerns.  If a referral was placed today, you will be contacted for an appointment. Please note that routine referrals can sometimes take up to 3-4 weeks to process. Please call our office if you haven't heard anything after this time frame.  Take care,  Rodman Pickle, NP

## 2021-11-20 NOTE — Assessment & Plan Note (Signed)
Chronic, worsening.  She has been noticing spider veins on her legs for the past 10 years, however they are spreading out more and becoming painful.  She does wear compression socks when able during the day.  We will place referral to vascular.

## 2021-11-20 NOTE — Assessment & Plan Note (Signed)
She was diagnosed with this when she was found to have a pulmonary embolism and is recommended to continue lifelong anticoagulation with Coumadin.  INR is 1.5 today, however she has missed a few doses of her Coumadin recently.  We will have her continue taking 5 mg of Coumadin daily and recheck her INR in 2 weeks.

## 2021-11-20 NOTE — Assessment & Plan Note (Signed)
Chronic, stable.  Continue losartan-HCTZ 100-25 mg daily, verapamil 240mg  daily, and furosemide 20mg  daily.

## 2021-11-20 NOTE — Assessment & Plan Note (Signed)
Chronic, stable.  Continue Lasix 20 mg daily.  Refill sent to the pharmacy.

## 2021-11-20 NOTE — Assessment & Plan Note (Signed)
Chronic, not controlled.  Her anxiety has worsened recently with an increase stress at work.  She has been having trouble leaving the house, and canceled plans with friends.  We will increase her duloxetine to 60 mg twice a day.  Follow-up in 4 to 6 weeks.

## 2021-11-20 NOTE — Assessment & Plan Note (Signed)
INR is 1.5 today.  She has missed some doses of Coumadin recently.  We will have her continue Coumadin 5 mg daily and recheck INR in 2 weeks.

## 2021-11-25 ENCOUNTER — Other Ambulatory Visit: Payer: Self-pay

## 2021-11-25 DIAGNOSIS — I10 Essential (primary) hypertension: Secondary | ICD-10-CM

## 2021-11-25 MED ORDER — LOSARTAN POTASSIUM-HCTZ 100-25 MG PO TABS: 1.0000 | ORAL_TABLET | Freq: Every day | ORAL | 1 refills | Status: DC

## 2021-12-15 ENCOUNTER — Ambulatory Visit (INDEPENDENT_AMBULATORY_CARE_PROVIDER_SITE_OTHER): Payer: BC Managed Care – PPO | Admitting: Nurse Practitioner

## 2021-12-15 ENCOUNTER — Encounter: Payer: Self-pay | Admitting: Nurse Practitioner

## 2021-12-15 VITALS — BP 132/80 | HR 74 | Temp 97.8°F | Wt 333.2 lb

## 2021-12-15 DIAGNOSIS — R7303 Prediabetes: Secondary | ICD-10-CM

## 2021-12-15 DIAGNOSIS — I10 Essential (primary) hypertension: Secondary | ICD-10-CM

## 2021-12-15 DIAGNOSIS — Z0000 Encounter for general adult medical examination without abnormal findings: Secondary | ICD-10-CM | POA: Diagnosis not present

## 2021-12-15 DIAGNOSIS — F419 Anxiety disorder, unspecified: Secondary | ICD-10-CM

## 2021-12-15 DIAGNOSIS — Z23 Encounter for immunization: Secondary | ICD-10-CM | POA: Diagnosis not present

## 2021-12-15 DIAGNOSIS — D6861 Antiphospholipid syndrome: Secondary | ICD-10-CM

## 2021-12-15 DIAGNOSIS — E782 Mixed hyperlipidemia: Secondary | ICD-10-CM

## 2021-12-15 DIAGNOSIS — F32A Depression, unspecified: Secondary | ICD-10-CM

## 2021-12-15 DIAGNOSIS — E559 Vitamin D deficiency, unspecified: Secondary | ICD-10-CM

## 2021-12-15 DIAGNOSIS — R350 Frequency of micturition: Secondary | ICD-10-CM | POA: Diagnosis not present

## 2021-12-15 DIAGNOSIS — Z7901 Long term (current) use of anticoagulants: Secondary | ICD-10-CM

## 2021-12-15 LAB — CBC WITH DIFFERENTIAL/PLATELET
Basophils Absolute: 0 10*3/uL (ref 0.0–0.1)
Basophils Relative: 0.7 % (ref 0.0–3.0)
Eosinophils Absolute: 0.2 10*3/uL (ref 0.0–0.7)
Eosinophils Relative: 3.8 % (ref 0.0–5.0)
HCT: 39.5 % (ref 36.0–46.0)
Hemoglobin: 13.3 g/dL (ref 12.0–15.0)
Lymphocytes Relative: 30.3 % (ref 12.0–46.0)
Lymphs Abs: 1.5 10*3/uL (ref 0.7–4.0)
MCHC: 33.6 g/dL (ref 30.0–36.0)
MCV: 89.8 fl (ref 78.0–100.0)
Monocytes Absolute: 0.3 10*3/uL (ref 0.1–1.0)
Monocytes Relative: 5.7 % (ref 3.0–12.0)
Neutro Abs: 2.9 10*3/uL (ref 1.4–7.7)
Neutrophils Relative %: 59.5 % (ref 43.0–77.0)
Platelets: 234 10*3/uL (ref 150.0–400.0)
RBC: 4.4 Mil/uL (ref 3.87–5.11)
RDW: 13.3 % (ref 11.5–15.5)
WBC: 5 10*3/uL (ref 4.0–10.5)

## 2021-12-15 LAB — LIPID PANEL
Cholesterol: 229 mg/dL — ABNORMAL HIGH (ref 0–200)
HDL: 53.6 mg/dL (ref >39.00)
LDL Cholesterol: 153 mg/dL — ABNORMAL HIGH (ref 0–99)
NonHDL: 175.52
Total CHOL/HDL Ratio: 4
Triglycerides: 113 mg/dL (ref 0.0–149.0)
VLDL: 22.6 mg/dL (ref 0.0–40.0)

## 2021-12-15 LAB — POCT URINALYSIS DIPSTICK
Bilirubin, UA: NEGATIVE
Blood, UA: NEGATIVE
Glucose, UA: NEGATIVE
Ketones, UA: NEGATIVE
Nitrite, UA: NEGATIVE
Protein, UA: NEGATIVE
Spec Grav, UA: 1.015 (ref 1.010–1.025)
Urobilinogen, UA: 0.2 E.U./dL
pH, UA: 6.5 (ref 5.0–8.0)

## 2021-12-15 LAB — COMPREHENSIVE METABOLIC PANEL
ALT: 12 U/L (ref 0–35)
AST: 14 U/L (ref 0–37)
Albumin: 3.9 g/dL (ref 3.5–5.2)
Alkaline Phosphatase: 81 U/L (ref 39–117)
BUN: 16 mg/dL (ref 6–23)
CO2: 29 mEq/L (ref 19–32)
Calcium: 8.8 mg/dL (ref 8.4–10.5)
Chloride: 104 mEq/L (ref 96–112)
Creatinine, Ser: 0.87 mg/dL (ref 0.40–1.20)
GFR: 79.51 mL/min (ref >60.00)
Glucose, Bld: 101 mg/dL — ABNORMAL HIGH (ref 70–99)
Potassium: 4 mEq/L (ref 3.5–5.1)
Sodium: 140 mEq/L (ref 135–145)
Total Bilirubin: 0.5 mg/dL (ref 0.2–1.2)
Total Protein: 6.8 g/dL (ref 6.0–8.3)

## 2021-12-15 LAB — PROTIME-INR
INR: 3.7 ratio — ABNORMAL HIGH (ref 0.8–1.0)
Prothrombin Time: 37 s — ABNORMAL HIGH (ref 9.6–13.1)

## 2021-12-15 LAB — HEMOGLOBIN A1C: Hgb A1c MFr Bld: 5.7 % (ref 4.6–6.5)

## 2021-12-15 LAB — VITAMIN D 25 HYDROXY (VIT D DEFICIENCY, FRACTURES): VITD: 19.61 ng/mL — ABNORMAL LOW (ref 30.00–100.00)

## 2021-12-15 MED ORDER — WARFARIN SODIUM 4 MG PO TABS: 4.0000 mg | ORAL_TABLET | Freq: Every day | ORAL | 0 refills | Status: DC

## 2021-12-15 MED ORDER — BUPROPION HCL ER (XL) 150 MG PO TB24: 150.0000 mg | ORAL_TABLET | Freq: Every day | ORAL | 2 refills | Status: DC

## 2021-12-15 NOTE — Assessment & Plan Note (Signed)
Chronic, stable.  BP today 132/80.  Continue Lasix 20 mg daily, losartan-hydrochlorothiazide 100-25 mg daily, verapamil 240 mg at bedtime.  Check CMP, CBC today.  Follow-up 6 months

## 2021-12-15 NOTE — Patient Instructions (Signed)
It was great to see you!  Start wellbutrin 1 tablet daily in the morning.   We are checking your labs today and will let you know the results via mychart/phone.   Let's follow-up in 2 months, sooner if you have concerns.  If a referral was placed today, you will be contacted for an appointment. Please note that routine referrals can sometimes take up to 3-4 weeks to process. Please call our office if you haven't heard anything after this time frame.  Take care,  Vance Peper, NP

## 2021-12-15 NOTE — Assessment & Plan Note (Signed)
He is fasting, will check lipid panel today.  Last visit her LDL was elevated at 199.

## 2021-12-15 NOTE — Assessment & Plan Note (Signed)
She is taking warfarin for this 5 mg daily.  We will check INR levels today and adjust regimen based on results.

## 2021-12-15 NOTE — Assessment & Plan Note (Signed)
Start a vitamin D supplement about a week ago.  We will check vitamin D levels today.

## 2021-12-15 NOTE — Assessment & Plan Note (Signed)
Chronic, not controlled.  She states that her depression is still ongoing even with increasing the duloxetine.  We will have her add on Wellbutrin 150 mg daily.  Discussed possible side effects.  She denies SI/HI.  Follow-up in 6 to 8 weeks.

## 2021-12-15 NOTE — Assessment & Plan Note (Addendum)
Checking INR today, last INR was 1.5.  She has been taking her warfarin every day, 5 mg daily.  Denies any signs of bleeding.

## 2021-12-15 NOTE — Addendum Note (Signed)
Addended by: Vance Peper A on: 12/15/2021 01:30 PM   Modules accepted: Orders

## 2021-12-15 NOTE — Assessment & Plan Note (Signed)
Last A1c was within normal limits, will check A1c today.

## 2021-12-15 NOTE — Progress Notes (Signed)
BP 132/80   Pulse 74   Temp 97.8 F (36.6 C) (Oral)   Wt (!) 333 lb 3.2 oz (151.1 kg)   LMP 12/21/2016   SpO2 98%   BMI 49.93 kg/m    Subjective:    Patient ID: Lauren Burns, female    DOB: 12/20/1975, 46 y.o.   MRN: [id number]  HPI: Lauren Burns is a 46 y.o. female presenting on 12/15/2021 for comprehensive medical examination. Current medical complaints include: ongoing depression  Last visit her duloxetine was increased to 60 mg twice a day.  She states that she has not noticed a significant difference when increasing the dosage.  She feels like she is going through the motions every day and have little interest in things.  She is also experiencing stress at work.  She denies SI/HI.  Depression Screen done today and results listed below:     12/15/2021   10:05 AM 05/05/2021    1:55 PM 03/01/2018    5:15 PM 11/12/2016   10:10 AM  Depression screen PHQ 2/9  Decreased Interest _0 Down, Depressed, Hopeless _1 PHQ - 2 Score _2 Altered sleeping 1 1 0 2  Tired, decreased energy _3 Change in appetite 2 0 0 1  Feeling bad or failure about yourself  0 0 1 1  Trouble concentrating _4 Moving slowly or fidgety/restless 0 0 0 0  Suicidal thoughts 0 0 0   PHQ-9 Score _5 Difficult doing work/chores Somewhat difficult Somewhat difficult        12/15/2021   10:06 AM 05/05/2021    1:55 PM  GAD 7 : Generalized Anxiety Score  Nervous, Anxious, on Edge 2 1  Control/stop worrying 3 1  Worry too much - different things 2 1  Trouble relaxing 1 1  Restless 0 0  Easily annoyed or irritable 0 0  Afraid - awful might happen 2 0  Total GAD 7 Score 10 4  Anxiety Difficulty Somewhat difficult Somewhat difficult    The patient does not have a history of falls. I did not complete a risk assessment for falls. A plan of care for falls was not documented.   Past Medical History:  Past Medical History:  Diagnosis Date   Antiphospholipid antibody  syndrome (HCC)    Anxiety    Arthritis    Depression    Endometriosis    Fatigue    Fibromyalgia    GERD (gastroesophageal reflux disease)    Heart murmur    Hypertension    IBS (irritable bowel syndrome)    Meralgia paresthetica of left side    Obesity    Pulmonary embolism ([place]) 2004   Shortness of breath    Sleep apnea    Vitamin D deficiency     Surgical History:  Past Surgical History:  Procedure Laterality Date   laprascopic     diagnosed endometriosis    Medications:  Current Outpatient Medications on File Prior to Visit  Medication Sig   DULoxetine (CYMBALTA) 60 MG capsule Take 1 capsule (60 mg total) by mouth 2 (two) times daily.   furosemide (LASIX) 20 MG tablet Take 1 tablet (20 mg total) by mouth daily.   gabapentin (NEURONTIN) 600 MG tablet Take 1 tablet (600 mg total) by mouth 3 (three) times daily.   losartan-hydrochlorothiazide (HYZAAR) 100-25 MG tablet Take 1 tablet by mouth  daily.   verapamil (CALAN-SR) 240 MG CR tablet Take 1 tablet (240 mg total) by mouth at bedtime.   warfarin (COUMADIN) 5 MG tablet Take 1 tablet (5 mg total) by mouth daily.   No current facility-administered medications on file prior to visit.    Allergies:  No Known Allergies  Social History:  Social History   Socioeconomic History   Marital status: Married    Spouse name: Lauren Burns   Number of children: 1   Years of education: Not on file   Highest education level: Bachelor's degree (e.g., BA, AB, BS)  Occupational History   Occupation: Audiological scientist  Tobacco Use   Smoking status: Former    Types: Cigarettes    Quit date: 12/17/2008    Years since quitting: 13.0   Smokeless tobacco: Never  Vaping Use   Vaping Use: Never used  Substance and Sexual Activity   Alcohol use: Yes    Alcohol/week: 1.0 standard drink of alcohol    Types: 1 Glasses of wine per week   Drug use: No   Sexual activity: Not on file  Other Topics Concern   Not on file  Social History  Narrative   Lives with husband, son   Social Determinants of Health   Financial Resource Strain: Not on file  Food Insecurity: Not on file  Transportation Needs: Not on file  Physical Activity: Not on file  Stress: Not on file  Social Connections: Not on file  Intimate Partner Violence: Not on file   Social History   Tobacco Use  Smoking Status Former   Types: Cigarettes   Quit date: 12/17/2008   Years since quitting: 13.0  Smokeless Tobacco Never   Social History   Substance and Sexual Activity  Alcohol Use Yes   Alcohol/week: 1.0 standard drink of alcohol   Types: 1 Glasses of wine per week    Family History:  Family History  Problem Relation Age of Onset   Hyperlipidemia Mother    Depression Mother    Anxiety disorder Mother    Obesity Mother    Heart disease Father    Alcohol abuse Father    Hypertension Father    Cancer Father    Alcoholism Father    Obesity Father    Diabetes Mellitus I Brother     Past medical history, surgical history, medications, allergies, family history and social history reviewed with patient today and changes made to appropriate areas of the chart.   Review of Systems  Constitutional:  Positive for malaise/fatigue. Negative for fever.  HENT: Negative.    Eyes: Negative.   Respiratory: Negative.    Cardiovascular: Negative.   Gastrointestinal:  Negative for constipation and diarrhea.       Lower abdominal cramping  Genitourinary:  Positive for frequency. Negative for dysuria.  Musculoskeletal: Negative.   Skin:  Positive for itching (bug bite right forearm).  Neurological: Negative.   Psychiatric/Behavioral:  Positive for depression. Negative for suicidal ideas.    All other ROS negative except what is listed above and in the HPI.      Objective:    BP 132/80   Pulse 74   Temp 97.8 F (36.6 C) (Oral)   Wt (!) 333 lb 3.2 oz (151.1 kg)   LMP 12/21/2016   SpO2 98%   BMI 49.93 kg/m   Wt Readings from Last 3  Encounters:  12/15/21 (!) 333 lb 3.2 oz (151.1 kg)  11/20/21 (!) 323 lb 9.6 oz (146.8 kg)  07/28/21 Marland Kitchen)  302 lb 6.4 oz (137.2 kg)    Physical Exam Vitals and nursing note reviewed.  Constitutional:      General: She is not in acute distress.    Appearance: Normal appearance.  HENT:     Head: Normocephalic and atraumatic.     Right Ear: Tympanic membrane, ear canal and external ear normal.     Left Ear: Tympanic membrane, ear canal and external ear normal.  Eyes:     Conjunctiva/sclera: Conjunctivae normal.  Cardiovascular:     Rate and Rhythm: Normal rate and regular rhythm.     Pulses: Normal pulses.     Heart sounds: Normal heart sounds.  Pulmonary:     Effort: Pulmonary effort is normal.     Breath sounds: Normal breath sounds.  Abdominal:     Palpations: Abdomen is soft.     Tenderness: There is no abdominal tenderness.  Musculoskeletal:        General: Normal range of motion.     Cervical back: Normal range of motion and neck supple.     Right lower leg: No edema.     Left lower leg: No edema.  Lymphadenopathy:     Cervical: No cervical adenopathy.  Skin:    General: Skin is warm and dry.  Neurological:     General: No focal deficit present.     Mental Status: She is alert and oriented to person, place, and time.     Cranial Nerves: No cranial nerve deficit.     Coordination: Coordination normal.     Gait: Gait normal.  Psychiatric:        Mood and Affect: Mood normal.        Behavior: Behavior normal.        Thought Content: Thought content normal.        Judgment: Judgment normal.     Results for orders placed or performed in visit on 12/15/21  POCT urinalysis dipstick  Result Value Ref Range   Color, UA yellow    Clarity, UA clear    Glucose, UA Negative Negative   Bilirubin, UA neg    Ketones, UA neg    Spec Grav, UA 1.015 1.010 - 1.025   Blood, UA neg    pH, UA 6.5 5.0 - 8.0   Protein, UA Negative Negative   Urobilinogen, UA 0.2 0.2 or 1.0 E.U./dL    Nitrite, UA neg    Leukocytes, UA Trace (A) Negative   Appearance     Odor none       Assessment & Plan:   Problem List Items Addressed This Visit       Cardiovascular and Mediastinum   Essential hypertension    Chronic, stable.  BP today 132/80.  Continue Lasix 20 mg daily, losartan-hydrochlorothiazide 100-25 mg daily, verapamil 240 mg at bedtime.  Check CMP, CBC today.  Follow-up 6 months        Hematopoietic and Hemostatic   Antiphospholipid antibody syndrome ([place])    She is taking warfarin for this 5 mg daily.  We will check INR levels today and adjust regimen based on results.      Relevant Orders   Protime-INR     Other   Mixed hyperlipidemia    He is fasting, will check lipid panel today.  Last visit her LDL was elevated at 199.      Relevant Orders   Lipid panel   Morbid obesity ([place])    She has gained about 30 to 40 pounds  in the past 6 months.  Discussed nutrition, exercise.  BMI today 49.9.      Vitamin D deficiency    Start a vitamin D supplement about a week ago.  We will check vitamin D levels today.      Relevant Orders   VITAMIN D 25 Hydroxy (Vit-D Deficiency, Fractures)   Prediabetes    Last A1c was within normal limits, will check A1c today.      Relevant Orders   Hemoglobin A1c   Long term current use of anticoagulants with INR goal of 2.0-3.0    Checking INR today, last INR was 1.5.  She has been taking her warfarin every day, 5 mg daily.  Denies any signs of bleeding.      Anxiety and depression    Chronic, not controlled.  She states that her depression is still ongoing even with increasing the duloxetine.  We will have her add on Wellbutrin 150 mg daily.  Discussed possible side effects.  She denies SI/HI.  Follow-up in 6 to 8 weeks.      Relevant Medications   buPROPion (WELLBUTRIN XL) 150 MG 24 hr tablet   Other Visit Diagnoses     Routine general medical examination at a health care facility    -  Primary   Health maintenance  reviewed and updated.  Check CMP, CBC today.  Discussed nutrition, exercise.  Follow-up 1 year   Relevant Orders   CBC with Differential/Platelet   Comprehensive metabolic panel   Urinary frequency       We will check UA today.  She also has a history of diabetes, will check A1c.   Relevant Orders   POCT urinalysis dipstick (Completed)   Need for influenza vaccination       Flu vaccine given today   Relevant Orders   Flu Vaccine QUAD 6+ mos PF IM (Fluarix Quad PF) (Completed)        Follow up plan: Return in about 2 months (around 02/14/2022) for Depression, INR check.   LABORATORY TESTING:  - Pap smear:  She will schedule an appointment with GYN  IMMUNIZATIONS:   - Tdap: Tetanus vaccination status reviewed: last tetanus booster within 10 years. - Influenza: Administered today - Pneumovax: Not applicable - Prevnar: Not applicable - HPV: Not applicable - Zostavax vaccine: Not applicable  SCREENING: -Mammogram: Up to date  - Colonoscopy:  ordered prior visit, has cologuard kit at home   - Bone Density: Not applicable  -Hearing Test: Not applicable  -Spirometry: Not applicable   PATIENT COUNSELING:   Advised to take 1 mg of folate supplement per day if capable of pregnancy.   Sexuality: Discussed sexually transmitted diseases, partner selection, use of condoms, avoidance of unintended pregnancy  and contraceptive alternatives.   Advised to avoid cigarette smoking.  I discussed with the patient that most people either abstain from alcohol or drink within safe limits (<=14/week and <=4 drinks/occasion for males, <=7/weeks and <= 3 drinks/occasion for females) and that the risk for alcohol disorders and other health effects rises proportionally with the number of drinks per week and how often a drinker exceeds daily limits.  Discussed cessation/primary prevention of drug use and availability of treatment for abuse.   Diet: Encouraged to adjust caloric intake to maintain  or  achieve ideal body weight, to reduce intake of dietary saturated fat and total fat, to limit sodium intake by avoiding high sodium foods and not adding table salt, and to maintain adequate dietary potassium and  calcium preferably from fresh fruits, vegetables, and low-fat dairy products.    stressed the importance of regular exercise  Injury prevention: Discussed safety belts, safety helmets, smoke detector, smoking near bedding or upholstery.   Dental health: Discussed importance of regular tooth brushing, flossing, and dental visits.    NEXT PREVENTATIVE PHYSICAL DUE IN 1 YEAR. Return in about 2 months (around 02/14/2022) for Depression, INR check.

## 2021-12-15 NOTE — Assessment & Plan Note (Signed)
She has gained about 30 to 40 pounds in the past 6 months.  Discussed nutrition, exercise.  BMI today 49.9.

## 2021-12-17 NOTE — Progress Notes (Signed)
Lab results, provider instructions and comments viewed by patient via mychart.

## 2022-01-08 ENCOUNTER — Encounter: Payer: Self-pay | Admitting: Nurse Practitioner

## 2022-01-08 ENCOUNTER — Other Ambulatory Visit: Payer: Self-pay | Admitting: Nurse Practitioner

## 2022-01-08 DIAGNOSIS — G8929 Other chronic pain: Secondary | ICD-10-CM

## 2022-01-08 MED ORDER — DULOXETINE HCL 60 MG PO CPEP: 60.0000 mg | ORAL_CAPSULE | Freq: Two times a day (BID) | ORAL | 1 refills | Status: DC

## 2022-01-11 ENCOUNTER — Other Ambulatory Visit: Payer: Self-pay | Admitting: *Deleted

## 2022-01-11 DIAGNOSIS — I8390 Asymptomatic varicose veins of unspecified lower extremity: Secondary | ICD-10-CM

## 2022-01-12 ENCOUNTER — Ambulatory Visit (HOSPITAL_COMMUNITY)
Admission: RE | Admit: 2022-01-12 | Discharge: 2022-01-12 | Disposition: A | Discharge status: Home / Self Care | Payer: BC Managed Care – PPO | Source: Ambulatory Visit | Attending: Surgery | Admitting: Surgery

## 2022-01-12 DIAGNOSIS — I8390 Asymptomatic varicose veins of unspecified lower extremity: Secondary | ICD-10-CM | POA: Diagnosis not present

## 2022-01-12 DIAGNOSIS — I8392 Asymptomatic varicose veins of left lower extremity: Secondary | ICD-10-CM

## 2022-01-14 ENCOUNTER — Ambulatory Visit: Payer: BC Managed Care – PPO | Admitting: Physician Assistant

## 2022-01-14 VITALS — BP 148/99 | HR 73 | Temp 97.6°F | Ht 68.0 in | Wt 329.0 lb

## 2022-01-14 DIAGNOSIS — I83812 Varicose veins of left lower extremities with pain: Secondary | ICD-10-CM | POA: Diagnosis not present

## 2022-01-14 NOTE — Progress Notes (Signed)
Requested by:  Gerre Scull, NP [address] [place],  [place] [address]  Reason for consultation: Painful varicose veins   History of Present Illness   Lauren Burns is a 46 y.o. ([birth year]) female who presents for evaluation of painful varicose veins of the left thigh.  She states that she has had these veins on her left lateral thigh for about 5 years and they have worsened over time.  Throughout the majority of the day they will cause her occasional aching pains and they are sensitive to touch.  They will cause her more pain if she accidentally knocks this leg into a chair.  She has tried some warm compresses before, which seemed to help a little.  She denies any burning, itching, swelling, bleeding, or ulcers.  She has tried knee-high compression stockings from the store, but only intermittently.  She does not elevate her legs.  She has no history of previous vein procedures.   She has no history of DVT.  She has a history of multiple PEs in the early 2000's, and she is on lifelong warfarin for this.  She has been diagnosed with antiphospholipid antibody syndrome.  Past Medical History:  Diagnosis Date   Antiphospholipid antibody syndrome (HCC)    Anxiety    Arthritis    Depression    Endometriosis    Fatigue    Fibromyalgia    GERD (gastroesophageal reflux disease)    Heart murmur    Hypertension    IBS (irritable bowel syndrome)    Meralgia paresthetica of left side    Obesity    Pulmonary embolism (HCC) 2004   Shortness of breath    Sleep apnea    Vitamin D deficiency     Past Surgical History:  Procedure Laterality Date   laprascopic     diagnosed endometriosis    Social History   Socioeconomic History   Marital status: Married    Spouse name: Barbara Cower   Number of children: 1   Years of education: Not on file   Highest education level: Bachelor's degree (e.g., BA, AB, BS)  Occupational History   Occupation: Research scientist (life sciences)  Tobacco Use    Smoking status: Former    Types: Cigarettes    Quit date: 12/17/2008    Years since quitting: 13.0   Smokeless tobacco: Never  Vaping Use   Vaping Use: Never used  Substance and Sexual Activity   Alcohol use: Yes    Alcohol/week: 1.0 standard drink of alcohol    Types: 1 Glasses of wine per week   Drug use: No   Sexual activity: Not on file  Other Topics Concern   Not on file  Social History Narrative   Lives with husband, son   Social Determinants of Health   Financial Resource Strain: Not on file  Food Insecurity: Not on file  Transportation Needs: Not on file  Physical Activity: Not on file  Stress: Not on file  Social Connections: Not on file  Intimate Partner Violence: Not on file    Family History  Problem Relation Age of Onset   Hyperlipidemia Mother    Depression Mother    Anxiety disorder Mother    Obesity Mother    Heart disease Father    Alcohol abuse Father    Hypertension Father    Cancer Father    Alcoholism Father    Obesity Father    Diabetes Mellitus I Brother     Current Outpatient Medications  Medication Sig Dispense Refill   buPROPion (WELLBUTRIN XL) 150 MG 24 hr tablet Take 1 tablet (150 mg total) by mouth daily. 30 tablet 2   DULoxetine (CYMBALTA) 60 MG capsule Take 1 capsule (60 mg total) by mouth 2 (two) times daily. 180 capsule 1   furosemide (LASIX) 20 MG tablet Take 1 tablet (20 mg total) by mouth daily. 90 tablet 3   gabapentin (NEURONTIN) 600 MG tablet TAKE 1 TABLET(600 MG) BY MOUTH THREE TIMES DAILY 270 tablet 1   losartan-hydrochlorothiazide (HYZAAR) 100-25 MG tablet Take 1 tablet by mouth daily. 90 tablet 1   verapamil (CALAN-SR) 240 MG CR tablet Take 1 tablet (240 mg total) by mouth at bedtime. 90 tablet 3   warfarin (COUMADIN) 4 MG tablet Take 1 tablet (4 mg total) by mouth daily. 90 tablet 0   No current facility-administered medications for this visit.    No Known Allergies  REVIEW OF SYSTEMS (negative unless checked):    Cardiac:  []  Chest pain or chest pressure? []  Shortness of breath upon activity? []  Shortness of breath when lying flat? []  Irregular heart rhythm?  Vascular:  []  Pain in calf, thigh, or hip brought on by walking? []  Pain in feet at night that wakes you up from your sleep? []  Blood clot in your veins? []  Leg swelling?  Pulmonary:  []  Oxygen at home? []  Productive cough? []  Wheezing?  Neurologic:  []  Sudden weakness in arms or legs? []  Sudden numbness in arms or legs? []  Sudden onset of difficult speaking or slurred speech? []  Temporary loss of vision in one eye? []  Problems with dizziness?  Gastrointestinal:  []  Blood in stool? []  Vomited blood?  Genitourinary:  []  Burning when urinating? []  Blood in urine?  Psychiatric:  []  Major depression  Hematologic:  []  Bleeding problems? [x]  Problems with blood clotting?  Dermatologic:  []  Rashes or ulcers?  Constitutional:  []  Fever or chills?  Ear/Nose/Throat:  []  Change in hearing? []  Nose bleeds? []  Sore throat?  Musculoskeletal:  []  Back pain? []  Joint pain? []  Muscle pain?   Physical Examination     Vitals:   01/14/22 1321  BP: (!) 148/99  Pulse: 73  Temp: 97.6 F (36.4 C)  TempSrc: Temporal  SpO2: 99%  Weight: (!) 329 lb (149.2 kg)  Height: 5\' 8"  (1.727 m)   Body mass index is 50.02 kg/m.  General:  WDWN in NAD; vital signs documented above Gait: Not observed HENT: WNL, normocephalic Pulmonary: normal non-labored breathing , without rales, rhonchi,  wheezing Cardiac: regular HR, without murmurs without carotid bruit Abdomen: soft, NT, no masses Skin: without rashes Vascular Exam/Pulses: Palpable DP pulses bilaterally Extremities: with varicose veins on the left lateral thigh, with reticular veins bilaterally, without edema, without stasis pigmentation, without lipodermatosclerosis, without ulcers Musculoskeletal: no muscle wasting or atrophy  Neurologic: A&O X 3;  No focal weakness  or paresthesias are detected Psychiatric:  The pt has Normal affect.  Non-invasive Vascular Imaging   LLE Venous Insufficiency Duplex (01/14/2022):    +--------------+---------+------+-----------+------------+--------+  LEFT          Reflux NoRefluxReflux TimeDiameter cmsComments                          Yes                                   +--------------+---------+------+-----------+------------+--------+  CFV  yes   >1 second                       +--------------+---------+------+-----------+------------+--------+  FV mid        no                                              +--------------+---------+------+-----------+------------+--------+  Popliteal     no                                              +--------------+---------+------+-----------+------------+--------+  GSV at SFJ              yes    >500 ms      0.88              +--------------+---------+------+-----------+------------+--------+  GSV prox thighno                            0.31              +--------------+---------+------+-----------+------------+--------+  GSV mid thigh no                            0.32              +--------------+---------+------+-----------+------------+--------+  GSV dist thighno                            0.27              +--------------+---------+------+-----------+------------+--------+  GSV at knee   no                            0.45              +--------------+---------+------+-----------+------------+--------+  SSV Pop Fossa no                            0.20              +--------------+---------+------+-----------+------------+--------+    Medical Decision Making   Lauren Burns is a 46 y.o. female who presents with painful varicose veins of the left leg  Based on the patient's venous study, she has venous reflux in her left common femoral vein and greater saphenous vein at  the saphenofemoral junction.  She does not have reflux anywhere else in her left leg, so she would not be a candidate for ablation She does have a history of multiple PEs in the early 2000's, for which she is on lifelong warfarin.  She has no history of DVTs I have explained to her the importance of elevating the legs, maintaining a healthy weight, avoiding prolonged sitting and standing, and exercise.  I have encouraged her to try thigh-high compression stockings, however it may be difficult finding the right size.  She can continue to take Tylenol as needed for pain and use warm compresses She can follow-up with [place] as needed   Ernestene Mention, PA-C [facility] Office: [phone]  01/14/2022, 1:18 PM  Clinic MD: Randie Heinz

## 2022-02-02 ENCOUNTER — Other Ambulatory Visit: Payer: Self-pay | Admitting: Nurse Practitioner

## 2022-02-22 ENCOUNTER — Encounter: Payer: Self-pay | Admitting: Nurse Practitioner

## 2022-02-27 ENCOUNTER — Other Ambulatory Visit: Payer: Self-pay | Admitting: Nurse Practitioner

## 2022-02-27 DIAGNOSIS — Z7901 Long term (current) use of anticoagulants: Secondary | ICD-10-CM

## 2022-02-27 DIAGNOSIS — D6861 Antiphospholipid syndrome: Secondary | ICD-10-CM

## 2022-02-27 MED ORDER — WARFARIN SODIUM 4 MG PO TABS: 4.0000 mg | ORAL_TABLET | Freq: Every day | ORAL | 0 refills | Status: DC

## 2022-03-04 ENCOUNTER — Other Ambulatory Visit: Payer: Self-pay | Admitting: Nurse Practitioner

## 2022-03-12 NOTE — Telephone Encounter (Signed)
Refill request for  Bupropion XL- 150 mg LR 12/15/21, #30, 2 rf LOV 12/15/21 FOV  none scheduled.   Please review and advise.  Thanks. Dm/cma

## 2022-03-12 NOTE — Telephone Encounter (Signed)
Lft detailed VM to rtn call to schedule a follow up appointment for med refills. Dm/cma

## 2022-03-13 NOTE — Telephone Encounter (Signed)
Unable to leave VM due to mailbox is full. Will try later.  Dm/cma  

## 2022-03-18 NOTE — Telephone Encounter (Signed)
Unable to leave VM due to mailbox is full. Will try later.  Dm/cma  

## 2022-03-18 NOTE — Telephone Encounter (Signed)
Left detailed VM to rtn call to schedule appt.  Dm/cma

## 2022-03-25 NOTE — Telephone Encounter (Signed)
Appointment scheduled 04/13/22.  Dm/cma

## 2022-04-08 ENCOUNTER — Other Ambulatory Visit: Payer: Self-pay | Admitting: Nurse Practitioner

## 2022-04-08 DIAGNOSIS — I10 Essential (primary) hypertension: Secondary | ICD-10-CM

## 2022-04-13 ENCOUNTER — Ambulatory Visit: Payer: BC Managed Care – PPO | Admitting: Nurse Practitioner

## 2022-04-13 ENCOUNTER — Encounter: Payer: Self-pay | Admitting: Nurse Practitioner

## 2022-04-13 VITALS — BP 142/98 | HR 79 | Temp 96.6°F | Ht 68.0 in | Wt 348.0 lb

## 2022-04-13 DIAGNOSIS — F32A Depression, unspecified: Secondary | ICD-10-CM

## 2022-04-13 DIAGNOSIS — M5441 Lumbago with sciatica, right side: Secondary | ICD-10-CM

## 2022-04-13 DIAGNOSIS — M5442 Lumbago with sciatica, left side: Secondary | ICD-10-CM

## 2022-04-13 DIAGNOSIS — D6861 Antiphospholipid syndrome: Secondary | ICD-10-CM

## 2022-04-13 DIAGNOSIS — G8929 Other chronic pain: Secondary | ICD-10-CM

## 2022-04-13 DIAGNOSIS — F419 Anxiety disorder, unspecified: Secondary | ICD-10-CM | POA: Diagnosis not present

## 2022-04-13 DIAGNOSIS — I10 Essential (primary) hypertension: Secondary | ICD-10-CM | POA: Diagnosis not present

## 2022-04-13 DIAGNOSIS — Z7901 Long term (current) use of anticoagulants: Secondary | ICD-10-CM | POA: Diagnosis not present

## 2022-04-13 MED ORDER — BUSPIRONE HCL 5 MG PO TABS: 5.0000 mg | ORAL_TABLET | Freq: Two times a day (BID) | ORAL | 1 refills | Status: DC

## 2022-04-13 NOTE — Assessment & Plan Note (Signed)
BMI 52.9. Discussed nutrition, exercise.

## 2022-04-13 NOTE — Progress Notes (Signed)
Established Patient Office Visit  Subjective   Patient ID: Lauren Burns, female    DOB: [birth year]  Age: 47 y.o. MRN: [id number]  Chief Complaint  Patient presents with   Acute Visit    Pain management /refills on medications, discuss b/p has up she has been checking, labs for INR and anxiety  getting nerves in public      HPI  Lauren Burns is here to follow-up on hypertension, chronic back pain, and anxiety.   She has been experiencing increased anxiety over the last few months. She notes that she is worrying about everything. She has not going out to the stores like normal and her husband has been doing the grocery shopping. She does not feel depressed, this is more of her anxiety.   She is having ongoing, chronic, low back pain that radiates to both of her hips. She has been getting massages every month and stretches at home that she had learned from PT. She takes tylenol as needed.   She has noted that her blood pressures have been elevated at home. Her numbers have ranged from 130s/80s-180s/100s. She denies chest pain and shortness of breath. As noted above, she has been having more pain and anxiety recently.      04/13/2022    3:21 PM 12/15/2021   10:05 AM 05/05/2021    1:55 PM 03/01/2018    5:15 PM 11/12/2016   10:10 AM  Depression screen PHQ 2/9  Decreased Interest 3 1 2 2 1   Down, Depressed, Hopeless 2 1 2 1 1   PHQ - 2 Score 5 2 4 3 2   Altered sleeping 1 1 1  0 2  Tired, decreased energy 3 3 3 2 3   Change in appetite 1 2 0 0 1  Feeling bad or failure about yourself  0 0 0 1 1  Trouble concentrating 2 2 1 1 1   Moving slowly or fidgety/restless 1 0 0 0 0  Suicidal thoughts 0 0 0 0   PHQ-9 Score 13 10 9 7 10   Difficult doing work/chores Somewhat difficult Somewhat difficult Somewhat difficult        04/13/2022    3:22 PM 12/15/2021   10:06 AM 05/05/2021    1:55 PM  GAD 7 : Generalized Anxiety Score  Nervous, Anxious, on Edge 3 2 1   Control/stop worrying 2 3 1    Worry too much - different things 2 2 1   Trouble relaxing 1 1 1   Restless 0 0 0  Easily annoyed or irritable 0 0 0  Afraid - awful might happen 3 2 0  Total GAD 7 Score 11 10 4   Anxiety Difficulty Somewhat difficult Somewhat difficult Somewhat difficult     ROS See pertinent positives and negatives per HPI.    Objective:     BP (!) 142/98   Pulse 79   Temp (!) 96.6 F (35.9 C)   Ht 5\' 8"  (1.727 m)   Wt (!) 348 lb (157.9 kg)   LMP 12/21/2016   SpO2 97%   BMI 52.91 kg/m  BP Readings from Last 3 Encounters:  04/13/22 (!) 142/98  01/14/22 (!) 148/99  12/15/21 132/80   Wt Readings from Last 3 Encounters:  04/13/22 (!) 348 lb (157.9 kg)  01/14/22 (!) 329 lb (149.2 kg)  12/15/21 (!) 333 lb 3.2 oz (151.1 kg)      Physical Exam Vitals and nursing note reviewed.  Constitutional:      General: She is not in acute  distress.    Appearance: Normal appearance.  HENT:     Head: Normocephalic.  Eyes:     Conjunctiva/sclera: Conjunctivae normal.  Cardiovascular:     Rate and Rhythm: Normal rate and regular rhythm.     Pulses: Normal pulses.     Heart sounds: Normal heart sounds.  Pulmonary:     Effort: Pulmonary effort is normal.     Breath sounds: Normal breath sounds.  Musculoskeletal:     Cervical back: Normal range of motion.  Skin:    General: Skin is warm.  Neurological:     General: No focal deficit present.     Mental Status: She is alert and oriented to person, place, and time.  Psychiatric:        Mood and Affect: Mood normal.        Behavior: Behavior normal.        Thought Content: Thought content normal.        Judgment: Judgment normal.    The 10-year ASCVD risk score (Arnett DK, et al., 2019) is: 2%    Assessment & Plan:   Problem List Items Addressed This Visit       Cardiovascular and Mediastinum   Essential hypertension    Chronic, ongoing. Her blood pressure is slightly elevated today at 142/98. Discussed limiting salt in her diet and  we are treating her pain and anxiety as noted below. Keep checking her blood pressure at home. Continue furosemide 20mg  daily, losartan-hctz 100-25mg  daily, and verapamil 240mg  daily. May need to add on amlodipine if BP not improving. Follow-up in 2 months.         Nervous and Auditory   Chronic bilateral low back pain with bilateral sciatica    Chronic, ongoing. She had a MRI in 2021 which showed early degenerative changes. She has done physical therapy with minimal relief. Discussed weight loss and she can continue tylenol as needed. Will place referral to pain management.       Relevant Medications   busPIRone (BUSPAR) 5 MG tablet   Other Relevant Orders   Ambulatory referral to Pain Clinic     Hematopoietic and Hemostatic   Antiphospholipid antibody syndrome (HCC)    Chronic ongoing. She does have history of pulmonary embolism and DVT. She is currently taking coumadin 4mg  daily. Will check INR today and adjust regimen based on results.         Other   Morbid obesity ([place])    BMI 52.9. Discussed nutrition, exercise.       Long term current use of anticoagulants with INR goal of 2.0-3.0    Check INR today and adjust based on results.       Relevant Orders   Protime-INR   Anxiety and depression - Primary    Chronic, not controlled. She feels like her depression is doing ok, but her anxiety has worsened. She has started pulling her hair and doesn't like to go out in public. Her PHQ 9 is a 13 and her GAD 7 is a 11. Will have her continue duloxetine 60mg  daily, wellbutrin 150mg  daily, and start her on buspar 5mg  BID. Will also place referral to a therapist.       Relevant Medications   busPIRone (BUSPAR) 5 MG tablet   Other Relevant Orders   Ambulatory referral to Psychology    Return in about 2 months (around 06/12/2022) for Anxiety, HTN.    Lauren Dancer, NP

## 2022-04-13 NOTE — Assessment & Plan Note (Signed)
Chronic, ongoing. Her blood pressure is slightly elevated today at 142/98. Discussed limiting salt in her diet and we are treating her pain and anxiety as noted below. Keep checking her blood pressure at home. Continue furosemide 20mg  daily, losartan-hctz 100-25mg  daily, and verapamil 240mg  daily. May need to add on amlodipine if BP not improving. Follow-up in 2 months.

## 2022-04-13 NOTE — Assessment & Plan Note (Signed)
Chronic, not controlled. She feels like her depression is doing ok, but her anxiety has worsened. She has started pulling her hair and doesn't like to go out in public. Her PHQ 9 is a 13 and her GAD 7 is a 11. Will have her continue duloxetine 60mg  daily, wellbutrin 150mg  daily, and start her on buspar 5mg  BID. Will also place referral to a therapist.

## 2022-04-13 NOTE — Assessment & Plan Note (Signed)
Chronic, ongoing. She had a MRI in 2021 which showed early degenerative changes. She has done physical therapy with minimal relief. Discussed weight loss and she can continue tylenol as needed. Will place referral to pain management.

## 2022-04-13 NOTE — Assessment & Plan Note (Signed)
Check INR today and adjust based on results.

## 2022-04-13 NOTE — Assessment & Plan Note (Signed)
Chronic ongoing. She does have history of pulmonary embolism and DVT. She is currently taking coumadin 4mg  daily. Will check INR today and adjust regimen based on results.

## 2022-04-13 NOTE — Patient Instructions (Signed)
It was great to see you!  Start buspar twice a day to help with anxiety. I am also placing a referral to a therapist.   Keep checking your blood pressure at home. Try to limit salt in your diet.   We are checking your labs today and will let you know the results via mychart/phone.   Let's follow-up in 8 weeks, sooner if you have concerns.  If a referral was placed today, you will be contacted for an appointment. Please note that routine referrals can sometimes take up to 3-4 weeks to process. Please call our office if you haven't heard anything after this time frame.  Take care,  Vance Peper, NP

## 2022-04-14 LAB — PROTIME-INR
INR: 2.7 ratio — ABNORMAL HIGH (ref 0.8–1.0)
Prothrombin Time: 27.9 s — ABNORMAL HIGH (ref 9.6–13.1)

## 2022-05-08 ENCOUNTER — Other Ambulatory Visit: Payer: Self-pay | Admitting: Nurse Practitioner

## 2022-06-21 ENCOUNTER — Other Ambulatory Visit: Payer: Self-pay | Admitting: Nurse Practitioner

## 2022-06-21 DIAGNOSIS — G8929 Other chronic pain: Secondary | ICD-10-CM

## 2022-06-24 NOTE — Telephone Encounter (Signed)
Refill request for pending Rx last RF 01/08/22 90 day supply, last OV 04/13/22 please advise.

## 2022-07-10 ENCOUNTER — Other Ambulatory Visit: Payer: Self-pay | Admitting: Nurse Practitioner

## 2022-07-10 NOTE — Telephone Encounter (Signed)
Lft vm  to rtn call to schedule appointment. Dm/cma

## 2022-07-20 DIAGNOSIS — Z1231 Encounter for screening mammogram for malignant neoplasm of breast: Secondary | ICD-10-CM | POA: Diagnosis not present

## 2022-07-20 NOTE — Telephone Encounter (Signed)
Lft vm  to rtn call to schedule appointment. Dm/cma  

## 2022-07-21 ENCOUNTER — Other Ambulatory Visit: Payer: Self-pay | Admitting: Nurse Practitioner

## 2022-07-21 LAB — HM MAMMOGRAPHY

## 2022-07-30 ENCOUNTER — Encounter: Payer: Self-pay | Admitting: Nurse Practitioner

## 2022-07-30 NOTE — Telephone Encounter (Signed)
Lft detailed VM to rtn call to schedule an appointment for med refills. Dm/cma

## 2022-08-20 ENCOUNTER — Other Ambulatory Visit: Payer: Self-pay | Admitting: Nurse Practitioner

## 2022-09-10 ENCOUNTER — Other Ambulatory Visit: Payer: Self-pay | Admitting: Nurse Practitioner

## 2022-09-10 DIAGNOSIS — I10 Essential (primary) hypertension: Secondary | ICD-10-CM

## 2022-09-10 NOTE — Telephone Encounter (Signed)
I called and spoke with patient and scheduled her for an office visit with Lauren on Monday June 17th at 4pm.

## 2022-09-10 NOTE — Telephone Encounter (Signed)
Requesting:  VERAPAMIL ER 240MG  TABLETS ,  WARFARIN SOD 4MG  TABLETS (BLUE)  Last Visit: 04/13/2022 Next Visit: Visit date not found Last Refill: 08/18/2021, 02/27/2022  Please Advise

## 2022-09-14 ENCOUNTER — Ambulatory Visit: Payer: BC Managed Care – PPO | Admitting: Nurse Practitioner

## 2022-09-14 ENCOUNTER — Encounter: Payer: Self-pay | Admitting: Nurse Practitioner

## 2022-09-14 VITALS — BP 126/84 | HR 77 | Temp 97.1°F | Ht 68.0 in | Wt 333.0 lb

## 2022-09-14 DIAGNOSIS — I10 Essential (primary) hypertension: Secondary | ICD-10-CM

## 2022-09-14 DIAGNOSIS — R7303 Prediabetes: Secondary | ICD-10-CM

## 2022-09-14 DIAGNOSIS — Z6841 Body Mass Index (BMI) 40.0 and over, adult: Secondary | ICD-10-CM

## 2022-09-14 DIAGNOSIS — D6861 Antiphospholipid syndrome: Secondary | ICD-10-CM

## 2022-09-14 DIAGNOSIS — E559 Vitamin D deficiency, unspecified: Secondary | ICD-10-CM | POA: Diagnosis not present

## 2022-09-14 DIAGNOSIS — E782 Mixed hyperlipidemia: Secondary | ICD-10-CM | POA: Diagnosis not present

## 2022-09-14 DIAGNOSIS — Z7901 Long term (current) use of anticoagulants: Secondary | ICD-10-CM

## 2022-09-14 NOTE — Patient Instructions (Signed)
It was great to see you!  We are checking your labs today and will let you know the results via mychart/phone.   Ask your insurance if they cover wegovy or zepbound for weight loss.   Let's follow-up in 2 months, sooner if you have concerns.  If a referral was placed today, you will be contacted for an appointment. Please note that routine referrals can sometimes take up to 3-4 weeks to process. Please call our office if you haven't heard anything after this time frame.  Take care,  Rodman Pickle, NP

## 2022-09-14 NOTE — Progress Notes (Unsigned)
   Established Patient Office Visit  Subjective   Patient ID: Lauren Burns, female    DOB: [birth year]  Age: 47 y.o. MRN: [id number]  Chief Complaint  Patient presents with   Medication Management    Follow up, lab work, weight loss options    HPI  {History (Optional):23778}  ROS    Objective:     BP 126/84   Pulse 77   Temp (!) 97.1 F (36.2 C)   Ht 5\' 8"  (1.727 m)   Wt (!) 333 lb (151 kg)   LMP 12/21/2016   SpO2 98%   BMI 50.63 kg/m  BP Readings from Last 3 Encounters:  09/14/22 126/84  04/13/22 (!) 142/98  01/14/22 (!) 148/99   Wt Readings from Last 3 Encounters:  09/14/22 (!) 333 lb (151 kg)  04/13/22 (!) 348 lb (157.9 kg)  01/14/22 (!) 329 lb (149.2 kg)      Physical Exam   Results for orders placed or performed in visit on 09/14/22  [facility] MAMMOGRAPHY  Result Value Ref Range   HM Mammogram 0-4 Bi-Rad 0-4 Bi-Rad, Self Reported Normal    {Labs (Optional):23779}  The 10-year ASCVD risk score (Arnett DK, et al., 2019) is: 1.6%    Assessment & Plan:   Problem List Items Addressed This Visit       Cardiovascular and Mediastinum   Essential hypertension - Primary   Relevant Orders   CBC   Comprehensive metabolic panel     Hematopoietic and Hemostatic   Antiphospholipid antibody syndrome (HCC)   Relevant Orders   Protime-INR     Other   Mixed hyperlipidemia   Relevant Orders   CBC   Comprehensive metabolic panel   Lipid panel   Vitamin D deficiency   Relevant Orders   VITAMIN D 25 Hydroxy (Vit-D Deficiency, Fractures)   Prediabetes   Relevant Orders   Hemoglobin A1c   Long term current use of anticoagulants with INR goal of 2.0-3.0   Relevant Orders   Protime-INR    No follow-ups on file.    Gerre Scull, NP

## 2022-09-15 ENCOUNTER — Telehealth: Payer: Self-pay

## 2022-09-15 ENCOUNTER — Other Ambulatory Visit: Payer: Self-pay

## 2022-09-15 ENCOUNTER — Encounter: Payer: Self-pay | Admitting: Nurse Practitioner

## 2022-09-15 DIAGNOSIS — Z7901 Long term (current) use of anticoagulants: Secondary | ICD-10-CM

## 2022-09-15 LAB — LIPID PANEL
Cholesterol: 181 mg/dL (ref 0–200)
HDL: 40.4 mg/dL (ref >39.00)
LDL Cholesterol: 124 mg/dL — ABNORMAL HIGH (ref 0–99)
NonHDL: 140.98
Total CHOL/HDL Ratio: 4
Triglycerides: 83 mg/dL (ref 0.0–149.0)
VLDL: 16.6 mg/dL (ref 0.0–40.0)

## 2022-09-15 LAB — CBC
HCT: 42.3 % (ref 36.0–46.0)
Hemoglobin: 13.8 g/dL (ref 12.0–15.0)
MCHC: 32.6 g/dL (ref 30.0–36.0)
MCV: 91 fl (ref 78.0–100.0)
Platelets: 265 10*3/uL (ref 150.0–400.0)
RBC: 4.65 Mil/uL (ref 3.87–5.11)
RDW: 13.1 % (ref 11.5–15.5)
WBC: 7 10*3/uL (ref 4.0–10.5)

## 2022-09-15 LAB — COMPREHENSIVE METABOLIC PANEL
ALT: 12 U/L (ref 0–35)
AST: 16 U/L (ref 0–37)
Albumin: 4.2 g/dL (ref 3.5–5.2)
Alkaline Phosphatase: 80 U/L (ref 39–117)
BUN: 16 mg/dL (ref 6–23)
CO2: 29 mEq/L (ref 19–32)
Calcium: 9.2 mg/dL (ref 8.4–10.5)
Chloride: 99 mEq/L (ref 96–112)
Creatinine, Ser: 0.75 mg/dL (ref 0.40–1.20)
GFR: 94.51 mL/min (ref >60.00)
Glucose, Bld: 94 mg/dL (ref 70–99)
Potassium: 3.9 mEq/L (ref 3.5–5.1)
Sodium: 137 mEq/L (ref 135–145)
Total Bilirubin: 0.4 mg/dL (ref 0.2–1.2)
Total Protein: 7.3 g/dL (ref 6.0–8.3)

## 2022-09-15 LAB — HEMOGLOBIN A1C: Hgb A1c MFr Bld: 5.4 % (ref 4.6–6.5)

## 2022-09-15 LAB — VITAMIN D 25 HYDROXY (VIT D DEFICIENCY, FRACTURES): VITD: 18.6 ng/mL — ABNORMAL LOW (ref 30.00–100.00)

## 2022-09-15 MED ORDER — VITAMIN D (ERGOCALCIFEROL) 1.25 MG (50000 UNIT) PO CAPS: 50000.0000 [IU] | ORAL_CAPSULE | ORAL | 0 refills | Status: DC

## 2022-09-15 NOTE — Assessment & Plan Note (Signed)
Chronic, stable.  Check CMP, CBC, lipid panel today and treat based on results.

## 2022-09-15 NOTE — Telephone Encounter (Signed)
Ok just waiting on her to call us back.

## 2022-09-15 NOTE — Assessment & Plan Note (Signed)
Check vitamin D levels today and adjust regimen based on results.  

## 2022-09-15 NOTE — Assessment & Plan Note (Signed)
BMI 50.6.  She has lost 15 pounds since her last visit, potentially more because she states that she had gained more weight in between as well.  Congratulated her on this!  Continue with nutrition and exercise changes.  We did discuss medications for weight loss in addition to dietary and lifestyle changes.  She is limited with her antiphospholipid antibody syndrome and would be eligible for Zepbound or Wegovy.  Encouraged her to reach out to her insurance to see if they cover this.  Did also discussed that there is a coupon for Zepbound for $550 a month.  Encouraged her to send a MyChart message if she would like one of the sent to the pharmacy.  Discussed possible side effects.

## 2022-09-15 NOTE — Addendum Note (Signed)
Addended by: Rodman Pickle A on: 09/15/2022 01:43 PM   Modules accepted: Orders

## 2022-09-15 NOTE — Assessment & Plan Note (Addendum)
Chronic ongoing. She does have history of pulmonary embolism and DVT. She is currently taking coumadin 4mg  daily. Will check INR today and adjust regimen based on results.  Follow-up in 2 months.

## 2022-09-15 NOTE — Assessment & Plan Note (Signed)
Chronic, stable.  BP today 126/84.  Continue Lasix 20 mg daily, losartan-HCTZ 100-25 mg daily, and verapamil to 40 mg daily.  Check CMP, CBC, lipid panel today.  Follow-up in 6 months.

## 2022-09-15 NOTE — Assessment & Plan Note (Signed)
Check INR today and adjust based on results.  

## 2022-09-16 NOTE — Telephone Encounter (Signed)
Lauren Burns  P [facility] (supporting [name] McElwee, NP)15 minutes ago (3:51 PM)    We can definitely try that. Yes I got the message. I'm going to call in the morning to schedule a time to come back to the lab.

## 2022-09-16 NOTE — Telephone Encounter (Signed)
LVM to return call.

## 2022-09-21 ENCOUNTER — Telehealth: Payer: Self-pay | Admitting: Nurse Practitioner

## 2022-09-21 DIAGNOSIS — I10 Essential (primary) hypertension: Secondary | ICD-10-CM

## 2022-09-21 DIAGNOSIS — E782 Mixed hyperlipidemia: Secondary | ICD-10-CM

## 2022-09-21 DIAGNOSIS — R7303 Prediabetes: Secondary | ICD-10-CM

## 2022-09-21 MED ORDER — WEGOVY 0.25 MG/0.5ML ~~LOC~~ SOAJ
0.2500 mg | SUBCUTANEOUS | 0 refills | Status: DC
Start: 2022-09-21 — End: 2023-08-18

## 2022-09-21 NOTE — Telephone Encounter (Signed)
LVM that [facility] sent in to pharamcy

## 2022-09-24 ENCOUNTER — Telehealth: Payer: Self-pay | Admitting: Pharmacy Technician

## 2022-09-24 NOTE — Telephone Encounter (Signed)
Pharmacy Patient Advocate Encounter   Received notification from CoverMyMeds that prior authorization for [facility] 0.25MG /0.5ML auto-injectors is required/requested.    [place] submitted to [employer] via CoverMyMeds Key/confirmation #/[id number] Status is pending

## 2022-09-25 NOTE — Telephone Encounter (Signed)
Patient Advocate Encounter  Received a fax from [facility] regarding Prior Authorization for [facility] 0.25MG /0.5ML auto-injectors.   Authorization has been DENIED due to    Determination letter attached to patient chart

## 2022-10-26 ENCOUNTER — Encounter: Payer: Self-pay | Admitting: Nurse Practitioner

## 2022-12-05 ENCOUNTER — Other Ambulatory Visit: Payer: Self-pay | Admitting: Nurse Practitioner

## 2022-12-05 DIAGNOSIS — I10 Essential (primary) hypertension: Secondary | ICD-10-CM

## 2022-12-05 DIAGNOSIS — R609 Edema, unspecified: Secondary | ICD-10-CM

## 2022-12-07 NOTE — Telephone Encounter (Signed)
Requesting: FUROSEMIDE 20MG  TABLETS, DULOXETINE DR 60MG  CAPSULES, WARFARIN SOD 4MG  TABLETS (BLUE), LOSARTAN/HCTZ 100/25MG  TABLETS  Last Visit: 09/14/2022 Next Visit: Visit date not found Last Refill: 11/20/21, 01/08/22, 09/09/21, 04/09/21  Please Advise

## 2022-12-17 ENCOUNTER — Other Ambulatory Visit: Payer: Self-pay | Admitting: Nurse Practitioner

## 2023-01-04 ENCOUNTER — Other Ambulatory Visit: Payer: Self-pay | Admitting: Nurse Practitioner

## 2023-04-20 ENCOUNTER — Other Ambulatory Visit: Payer: Self-pay | Admitting: Nurse Practitioner

## 2023-04-20 DIAGNOSIS — G8929 Other chronic pain: Secondary | ICD-10-CM

## 2023-04-21 NOTE — Telephone Encounter (Signed)
Requesting: GABAPENTIN 600MG  TABLETS  Last Visit: 09/14/2022 Next Visit: Visit date not found Last Refill: 06/24/2022  Please Advise

## 2023-05-12 DIAGNOSIS — M25562 Pain in left knee: Secondary | ICD-10-CM | POA: Diagnosis not present

## 2023-05-13 DIAGNOSIS — M79605 Pain in left leg: Secondary | ICD-10-CM | POA: Diagnosis not present

## 2023-05-17 ENCOUNTER — Other Ambulatory Visit: Payer: Self-pay | Admitting: Nurse Practitioner

## 2023-05-17 NOTE — Telephone Encounter (Signed)
 Requesting: WARFARIN SOD 4MG  TABLETS (BLUE), BUSPIRONE 5MG  TABLETS  Last Visit: 09/14/2022 Next Visit: Visit date not found-Patient is due for an appointment. Last Refill: 12/07/2022, 01/07/2023  Please Advise

## 2023-06-15 DIAGNOSIS — M25562 Pain in left knee: Secondary | ICD-10-CM | POA: Diagnosis not present

## 2023-06-18 ENCOUNTER — Other Ambulatory Visit: Payer: Self-pay | Admitting: Nurse Practitioner

## 2023-06-18 NOTE — Telephone Encounter (Signed)
 Requesting: BUSPIRONE 5MG  TABLETS , WARFARIN SOD 4MG  TABLETS (BLUE)  Last Visit: 09/14/2022 Next Visit: Visit date not found Last Refill: 05/17/2023  Please Advise    I sent patient a message to call and schedule an appointment.

## 2023-07-06 DIAGNOSIS — M25562 Pain in left knee: Secondary | ICD-10-CM | POA: Diagnosis not present

## 2023-07-13 DIAGNOSIS — M25562 Pain in left knee: Secondary | ICD-10-CM | POA: Diagnosis not present

## 2023-07-15 ENCOUNTER — Other Ambulatory Visit: Payer: Self-pay | Admitting: Nurse Practitioner

## 2023-07-15 DIAGNOSIS — I10 Essential (primary) hypertension: Secondary | ICD-10-CM

## 2023-07-15 DIAGNOSIS — G8929 Other chronic pain: Secondary | ICD-10-CM

## 2023-07-15 NOTE — Telephone Encounter (Signed)
 Requesting: DULOXETINE DR 60MG  CAPSULES, GABAPENTIN 600MG  TABLETS , WARFARIN SOD 4MG  TABLETS , BUSPIRONE 5MG  TABLETS ,LOSARTAN/HCTZ 100/25MG  TABLETS  Last Visit: 09/14/2022 Next Visit: Visit date not found Last Refill: 12/07/2022, 04/21/2023, 06/18/2023, 06/18/2023, 12/07/2022  Please Advise

## 2023-08-04 ENCOUNTER — Encounter: Payer: Self-pay | Admitting: Nurse Practitioner

## 2023-08-04 DIAGNOSIS — S83242A Other tear of medial meniscus, current injury, left knee, initial encounter: Secondary | ICD-10-CM | POA: Diagnosis not present

## 2023-08-15 ENCOUNTER — Other Ambulatory Visit: Payer: Self-pay | Admitting: Nurse Practitioner

## 2023-08-18 ENCOUNTER — Encounter: Payer: Self-pay | Admitting: Nurse Practitioner

## 2023-08-18 ENCOUNTER — Ambulatory Visit: Admitting: Nurse Practitioner

## 2023-08-18 ENCOUNTER — Ambulatory Visit: Payer: Self-pay | Admitting: Nurse Practitioner

## 2023-08-18 VITALS — BP 130/80 | HR 78 | Temp 96.9°F | Ht 68.0 in | Wt 329.8 lb

## 2023-08-18 DIAGNOSIS — Z7901 Long term (current) use of anticoagulants: Secondary | ICD-10-CM

## 2023-08-18 DIAGNOSIS — D6861 Antiphospholipid syndrome: Secondary | ICD-10-CM

## 2023-08-18 DIAGNOSIS — E559 Vitamin D deficiency, unspecified: Secondary | ICD-10-CM | POA: Diagnosis not present

## 2023-08-18 DIAGNOSIS — I10 Essential (primary) hypertension: Secondary | ICD-10-CM | POA: Diagnosis not present

## 2023-08-18 DIAGNOSIS — G8929 Other chronic pain: Secondary | ICD-10-CM

## 2023-08-18 DIAGNOSIS — F32A Depression, unspecified: Secondary | ICD-10-CM

## 2023-08-18 DIAGNOSIS — E782 Mixed hyperlipidemia: Secondary | ICD-10-CM | POA: Diagnosis not present

## 2023-08-18 DIAGNOSIS — M25562 Pain in left knee: Secondary | ICD-10-CM

## 2023-08-18 DIAGNOSIS — Z01818 Encounter for other preprocedural examination: Secondary | ICD-10-CM | POA: Diagnosis not present

## 2023-08-18 DIAGNOSIS — R7303 Prediabetes: Secondary | ICD-10-CM | POA: Diagnosis not present

## 2023-08-18 DIAGNOSIS — F419 Anxiety disorder, unspecified: Secondary | ICD-10-CM

## 2023-08-18 LAB — CBC WITH DIFFERENTIAL/PLATELET
Basophils Absolute: 0.1 10*3/uL (ref 0.0–0.1)
Basophils Relative: 0.8 % (ref 0.0–3.0)
Eosinophils Absolute: 0.3 10*3/uL (ref 0.0–0.7)
Eosinophils Relative: 4 % (ref 0.0–5.0)
HCT: 41.7 % (ref 36.0–46.0)
Hemoglobin: 14.1 g/dL (ref 12.0–15.0)
Lymphocytes Relative: 30.5 % (ref 12.0–46.0)
Lymphs Abs: 2 10*3/uL (ref 0.7–4.0)
MCHC: 33.8 g/dL (ref 30.0–36.0)
MCV: 88.7 fl (ref 78.0–100.0)
Monocytes Absolute: 0.4 10*3/uL (ref 0.1–1.0)
Monocytes Relative: 6.6 % (ref 3.0–12.0)
Neutro Abs: 3.8 10*3/uL (ref 1.4–7.7)
Neutrophils Relative %: 58.1 % (ref 43.0–77.0)
Platelets: 287 10*3/uL (ref 150.0–400.0)
RBC: 4.7 Mil/uL (ref 3.87–5.11)
RDW: 12.7 % (ref 11.5–15.5)
WBC: 6.5 10*3/uL (ref 4.0–10.5)

## 2023-08-18 LAB — COMPREHENSIVE METABOLIC PANEL WITH GFR
ALT: 13 U/L (ref 0–35)
AST: 18 U/L (ref 0–37)
Albumin: 4.6 g/dL (ref 3.5–5.2)
Alkaline Phosphatase: 80 U/L (ref 39–117)
BUN: 28 mg/dL — ABNORMAL HIGH (ref 6–23)
CO2: 29 meq/L (ref 19–32)
Calcium: 9.3 mg/dL (ref 8.4–10.5)
Chloride: 95 meq/L — ABNORMAL LOW (ref 96–112)
Creatinine, Ser: 0.82 mg/dL (ref 0.40–1.20)
GFR: 84.37 mL/min (ref >60.00)
Glucose, Bld: 97 mg/dL (ref 70–99)
Potassium: 3.4 meq/L — ABNORMAL LOW (ref 3.5–5.1)
Sodium: 134 meq/L — ABNORMAL LOW (ref 135–145)
Total Bilirubin: 0.5 mg/dL (ref 0.2–1.2)
Total Protein: 7.7 g/dL (ref 6.0–8.3)

## 2023-08-18 LAB — LIPID PANEL
Cholesterol: 244 mg/dL — ABNORMAL HIGH (ref 0–200)
HDL: 56.1 mg/dL (ref >39.00)
LDL Cholesterol: 170 mg/dL — ABNORMAL HIGH (ref 0–99)
NonHDL: 188.15
Total CHOL/HDL Ratio: 4
Triglycerides: 93 mg/dL (ref 0.0–149.0)
VLDL: 18.6 mg/dL (ref 0.0–40.0)

## 2023-08-18 LAB — PROTIME-INR
INR: 2.6 ratio — ABNORMAL HIGH (ref 0.8–1.0)
Prothrombin Time: 26.5 s — ABNORMAL HIGH (ref 9.6–13.1)

## 2023-08-18 LAB — VITAMIN D 25 HYDROXY (VIT D DEFICIENCY, FRACTURES): VITD: 26.28 ng/mL — ABNORMAL LOW (ref 30.00–100.00)

## 2023-08-18 LAB — HEMOGLOBIN A1C: Hgb A1c MFr Bld: 5.6 % (ref 4.6–6.5)

## 2023-08-18 MED ORDER — WEGOVY 0.5 MG/0.5ML ~~LOC~~ SOAJ
0.5000 mg | SUBCUTANEOUS | 1 refills | Status: DC
Start: 2023-08-18 — End: 2023-10-07

## 2023-08-18 MED ORDER — BUSPIRONE HCL 5 MG PO TABS: 5.0000 mg | ORAL_TABLET | Freq: Two times a day (BID) | ORAL | 0 refills | Status: DC

## 2023-08-18 MED ORDER — VITAMIN D (ERGOCALCIFEROL) 1.25 MG (50000 UNIT) PO CAPS: 50000.0000 [IU] | ORAL_CAPSULE | ORAL | 0 refills | Status: DC

## 2023-08-18 MED ORDER — ENOXAPARIN SODIUM 150 MG/ML IJ SOSY: 150.0000 mg | PREFILLED_SYRINGE | Freq: Two times a day (BID) | INTRAMUSCULAR | 0 refills | Status: DC

## 2023-08-18 NOTE — Progress Notes (Signed)
 Established Patient Office Visit  Subjective   Patient ID: Lauren Burns, female    DOB: 11/16/1975  Age: 48 y.o. MRN: [id number]  Chief Complaint  Patient presents with   med check     Wants to talk about meds thinks she needs to have an INR done as well    HPI Discussed the use of AI scribe software for clinical note transcription with the patient, who gave verbal consent to proceed.  History of Present Illness   Lauren Burns is a 48 year old female who presents for pre-operative evaluation and management of her medications.  She sustained a knee injury after falling down two steps, resulting in significant bruising and pain. An x-ray in January showed no significant findings, but further evaluation revealed a shredded meniscus. Surgery is scheduled in two weeks. She experiences burning pain by the end of the week, affecting her sleep, and wears compression socks daily, which she finds helpful. There is knee swelling and fluid accumulation. No chest pain, shortness of breath, blood in urine or stool, or significant bruising. A scan post-fall ruled out DVT.  She recently started Wegovy  for weight management and follows a high-protein, low-calorie diet, with a weight decrease from 334 pounds to 329 pounds since January.  Her current medications include Cymbalta , Wellbutrin , losartan , hydrochlorothiazide, verapamil , a one-a-day allergy medication, biotin, vitamin D , and a generic form of Prilosec. She has been mindful of taking her warfarin without missing doses but has not been consistent with INR checks.       ROS See pertinent positives and negatives per HPI.    Objective:     BP 130/80 (BP Location: Left Arm, Patient Position: Sitting, Cuff Size: Normal)   Pulse 78   Temp (!) 96.9 F (36.1 C) (Temporal)   Ht 5\' 8"  (1.727 m)   Wt (!) 329 lb 12.8 oz (149.6 kg)   LMP 12/21/2016   SpO2 99%   BMI 50.15 kg/m  BP Readings from Last 3 Encounters:  08/18/23 130/80   09/14/22 126/84  04/13/22 (!) 142/98   Wt Readings from Last 3 Encounters:  08/18/23 (!) 329 lb 12.8 oz (149.6 kg)  09/14/22 (!) 333 lb (151 kg)  04/13/22 (!) 348 lb (157.9 kg)     Physical Exam Vitals and nursing note reviewed.  Constitutional:      General: She is not in acute distress.    Appearance: Normal appearance.  HENT:     Head: Normocephalic.  Eyes:     Conjunctiva/sclera: Conjunctivae normal.  Cardiovascular:     Rate and Rhythm: Normal rate and regular rhythm.     Pulses: Normal pulses.     Heart sounds: Normal heart sounds.  Pulmonary:     Effort: Pulmonary effort is normal.     Breath sounds: Normal breath sounds.  Musculoskeletal:     Cervical back: Normal range of motion.  Skin:    General: Skin is warm.  Neurological:     General: No focal deficit present.     Mental Status: She is alert and oriented to person, place, and time.  Psychiatric:        Mood and Affect: Mood normal.        Behavior: Behavior normal.        Thought Content: Thought content normal.        Judgment: Judgment normal.     The 10-year ASCVD risk score (Arnett DK, et al., 2019) is: 1.9%    Assessment &  Plan:   Problem List Items Addressed This Visit       Cardiovascular and Mediastinum   Essential hypertension - Primary   Chronic, stable. Continue Lasix  20 mg daily, losartan -HCTZ 100-25 mg daily, and verapamil  to 40 mg daily.  Check CMP, CBC, lipid panel today.  Follow-up in 6 months.      Relevant Medications   enoxaparin (LOVENOX) 150 MG/ML injection   Other Relevant Orders   CBC with Differential/Platelet (Completed)   Comprehensive metabolic panel with GFR (Completed)     Hematopoietic and Hemostatic   Antiphospholipid antibody syndrome (HCC)   Chronic ongoing. She does have history of pulmonary embolism and DVT. She is currently taking coumadin  4mg  daily. Will check INR today and adjust regimen based on results.  Follow-up in 2 months.      Relevant Orders    Protime-INR (Completed)     Other   Mixed hyperlipidemia   Chronic, stable.  Check CMP, CBC, lipid panel today and treat based on results.      Relevant Medications   enoxaparin (LOVENOX) 150 MG/ML injection   Other Relevant Orders   CBC with Differential/Platelet (Completed)   Comprehensive metabolic panel with GFR (Completed)   Lipid panel (Completed)   Morbid obesity (HCC)   Diagnosed with obesity, she has started Wegovy  and is following a high-protein, low-calorie diet, motivated by family success. Wegovy  should be stopped one week before surgery and resumed after surgery. Wegovy  0.5 mg is ordered. She should maintain her diet and drink plenty of water.      Relevant Medications   Semaglutide -Weight Management (WEGOVY ) 0.5 MG/0.5ML SOAJ   Vitamin D  deficiency   Check vitamin D  levels today and adjust regimen based on results.      Relevant Orders   VITAMIN D  25 Hydroxy (Vit-D Deficiency, Fractures) (Completed)   Prediabetes   Last A1c was within normal limits, will check A1c today.      Relevant Orders   Hemoglobin A1c (Completed)   Long term current use of anticoagulants with INR goal of 2.0-3.0   Check INR today and adjust based on results.       Relevant Orders   Protime-INR (Completed)   Anxiety and depression   Chronic, stable. Continue cymbalta  60mg  daily, wellbutrin  150mg  daily, and buspar  5mg  BID.       Relevant Medications   busPIRone  (BUSPAR ) 5 MG tablet   Chronic pain of left knee   A confirmed meniscus tear is causing pain, swelling, and mobility issues. Surgery is scheduled in two weeks. She should wear compression socks daily to manage swelling. Stop Coumadin  and switch to Lovenox one week before surgery. After discussing with Dr. Therese Flash, plan for coumadin  to lovenox bridge:   With your surgery date being June 4th:   Take the last dose of coumadin  on 08/26/23   No coumadin  or lovenox on 08/27/23 and 08/28/23.    On June 1: start lovenox shot twice  a day    June 2: Lovenox twice a day   June 3rd: Lovenox only in the morning   June 4th: No Lovenox on day of surgery    June 5th: No lovenox. Start coumadin  in the evening 4mg     June 6th: Re-start lovenox twice a day and coumadin  4mg  in the evening.    You will need to stay on the lovenox twice a day until your INR is back in range. I would like you to come in for a coumadin  check on 6/6 or  6/9.       Other Visit Diagnoses       Preoperative clearance       EKG shows normal sinus rhythm, HR 66 no ST or T wave changes. Checking labs and gave her lovenox bridge before and after surgery.   Relevant Orders   EKG 12-Lead (Completed)      Return in about 2 months (around 10/18/2023) for weight, inr.    Odette Benjamin, NP

## 2023-08-18 NOTE — Telephone Encounter (Signed)
 I called and spoke with patient and she is scheduled for knee surgery on 09/01/23 and there is an appointment scheduled already for 09/08/23. Is this appointment for 09/08/23 that was already scheduled ok or need for her to be seen sooner?

## 2023-08-18 NOTE — Patient Instructions (Signed)
 It was great to see you!  We are checking your labs today and will let you know the results via mychart/phone.   I have sent the higher dose of wegovy  to your pharmacy. Stop your wegovy  1 week before your upcoming surgery.   Let's follow-up in 2 months, sooner if you have concerns.  If a referral was placed today, you will be contacted for an appointment. Please note that routine referrals can sometimes take up to 3-4 weeks to process. Please call our office if you haven't heard anything after this time frame.  Take care,  Rheba Cedar, NP

## 2023-08-18 NOTE — Progress Notes (Signed)
 EKG interpreted by me on 08/18/23 showed normal sinus rhythm with a heart rate of 66. No ST or T wave changes.

## 2023-08-19 ENCOUNTER — Telehealth: Payer: Self-pay

## 2023-08-19 NOTE — Assessment & Plan Note (Signed)
Check INR today and adjust based on results.  

## 2023-08-19 NOTE — Assessment & Plan Note (Signed)
Chronic ongoing. She does have history of pulmonary embolism and DVT. She is currently taking coumadin 4mg  daily. Will check INR today and adjust regimen based on results.  Follow-up in 2 months.

## 2023-08-19 NOTE — Assessment & Plan Note (Signed)
 A confirmed meniscus tear is causing pain, swelling, and mobility issues. Surgery is scheduled in two weeks. She should wear compression socks daily to manage swelling. Stop Coumadin  and switch to Lovenox one week before surgery. After discussing with Dr. Therese Flash, plan for coumadin  to lovenox bridge:   With your surgery date being June 4th:   Take the last dose of coumadin  on 08/26/23   No coumadin  or lovenox on 08/27/23 and 08/28/23.    On June 1: start lovenox shot twice a day    June 2: Lovenox twice a day   June 3rd: Lovenox only in the morning   June 4th: No Lovenox on day of surgery    June 5th: No lovenox. Start coumadin  in the evening 4mg     June 6th: Re-start lovenox twice a day and coumadin  4mg  in the evening.    You will need to stay on the lovenox twice a day until your INR is back in range. I would like you to come in for a coumadin  check on 6/6 or 6/9.

## 2023-08-19 NOTE — Assessment & Plan Note (Signed)
Chronic, stable.  Check CMP, CBC, lipid panel today and treat based on results.

## 2023-08-19 NOTE — Assessment & Plan Note (Signed)
 Chronic, stable. Continue Lasix  20 mg daily, losartan -HCTZ 100-25 mg daily, and verapamil  to 40 mg daily.  Check CMP, CBC, lipid panel today.  Follow-up in 6 months.

## 2023-08-19 NOTE — Telephone Encounter (Signed)
 Can we get a prior auth on Wegovy  0.5mg /0.74ml?  Patient ID # [id number]

## 2023-08-19 NOTE — Assessment & Plan Note (Signed)
 Chronic, stable. Continue cymbalta  60mg  daily, wellbutrin  150mg  daily, and buspar  5mg  BID.

## 2023-08-19 NOTE — Assessment & Plan Note (Signed)
Last A1c was within normal limits, will check A1c today.

## 2023-08-19 NOTE — Assessment & Plan Note (Signed)
Check vitamin D levels today and adjust regimen based on results.

## 2023-08-19 NOTE — Assessment & Plan Note (Signed)
 Diagnosed with obesity, she has started Wegovy  and is following a high-protein, low-calorie diet, motivated by family success. Wegovy  should be stopped one week before surgery and resumed after surgery. Wegovy  0.5 mg is ordered. She should maintain her diet and drink plenty of water.

## 2023-08-24 ENCOUNTER — Telehealth: Payer: Self-pay

## 2023-08-24 ENCOUNTER — Other Ambulatory Visit (HOSPITAL_COMMUNITY): Payer: Self-pay

## 2023-08-24 NOTE — Telephone Encounter (Signed)
   No p/a needed. Pts pays cash price as Weight loss drugs are excluded from the pts plan. Pt is also aware

## 2023-08-24 NOTE — Telephone Encounter (Signed)
 Noted

## 2023-09-01 DIAGNOSIS — G8918 Other acute postprocedural pain: Secondary | ICD-10-CM | POA: Diagnosis not present

## 2023-09-01 DIAGNOSIS — S83232A Complex tear of medial meniscus, current injury, left knee, initial encounter: Secondary | ICD-10-CM | POA: Diagnosis not present

## 2023-09-01 DIAGNOSIS — Y999 Unspecified external cause status: Secondary | ICD-10-CM | POA: Diagnosis not present

## 2023-09-01 DIAGNOSIS — M2242 Chondromalacia patellae, left knee: Secondary | ICD-10-CM | POA: Diagnosis not present

## 2023-09-01 DIAGNOSIS — X58XXXA Exposure to other specified factors, initial encounter: Secondary | ICD-10-CM | POA: Diagnosis not present

## 2023-09-01 DIAGNOSIS — M94262 Chondromalacia, left knee: Secondary | ICD-10-CM | POA: Diagnosis not present

## 2023-09-01 DIAGNOSIS — M25762 Osteophyte, left knee: Secondary | ICD-10-CM | POA: Diagnosis not present

## 2023-09-01 DIAGNOSIS — M948X6 Other specified disorders of cartilage, lower leg: Secondary | ICD-10-CM | POA: Diagnosis not present

## 2023-09-08 ENCOUNTER — Encounter: Payer: Self-pay | Admitting: Nurse Practitioner

## 2023-09-08 ENCOUNTER — Ambulatory Visit: Admitting: Nurse Practitioner

## 2023-09-08 ENCOUNTER — Ambulatory Visit: Payer: Self-pay | Admitting: Nurse Practitioner

## 2023-09-08 VITALS — BP 140/90 | HR 78 | Temp 97.0°F | Ht 68.0 in | Wt 327.8 lb

## 2023-09-08 DIAGNOSIS — D6861 Antiphospholipid syndrome: Secondary | ICD-10-CM

## 2023-09-08 DIAGNOSIS — Z7901 Long term (current) use of anticoagulants: Secondary | ICD-10-CM | POA: Diagnosis not present

## 2023-09-08 DIAGNOSIS — M25562 Pain in left knee: Secondary | ICD-10-CM | POA: Diagnosis not present

## 2023-09-08 DIAGNOSIS — G8929 Other chronic pain: Secondary | ICD-10-CM

## 2023-09-08 DIAGNOSIS — I10 Essential (primary) hypertension: Secondary | ICD-10-CM

## 2023-09-08 LAB — PROTIME-INR
INR: 1.8 ratio — ABNORMAL HIGH (ref 0.8–1.0)
Prothrombin Time: 18.2 s — ABNORMAL HIGH (ref 9.6–13.1)

## 2023-09-08 MED ORDER — ENOXAPARIN SODIUM 150 MG/ML IJ SOSY: 150.0000 mg | PREFILLED_SYRINGE | Freq: Two times a day (BID) | INTRAMUSCULAR | 0 refills | Status: AC

## 2023-09-08 NOTE — Progress Notes (Signed)
 Established Patient Office Visit  Subjective   Patient ID: Lauren Burns, female    DOB: [birth year]  Age: 48 y.o. MRN: [id number]  Chief Complaint  Patient presents with   Medication check    Wants to talk about wagovy and upping dose as well as getting an INR check    HPI Discussed the use of AI scribe software for clinical note transcription with the patient, who gave verbal consent to proceed.  History of Present Illness   Lauren Burns is a 48 year old female who presents for post-operative follow-up and weight management.  She underwent surgery last week for left knee pain with meniscus tear and bone spurs. Movement has improved, and pain is manageable. No bleeding issues are present. She is performing exercises as part of her recovery.  She has been using Wegovy  for weight management. Her weight was 320 pounds before surgery, but she has not taken a Wegovy  shot since May 22, leading to increased hunger. Despite dietary lapses, her weight remains lower than at her last appointment.  She has not monitored her blood pressure at home since surgery, but it was stable on the day of the procedure. There is a slight increase in blood pressure today, attributed to her focus on recovery. No chest pain, shortness of breath, or headaches. She is working from home and anticipates a full recovery in four to six weeks with a gradual return to desk duties.        ROS See pertinent positives and negatives per HPI.    Objective:     BP (!) 140/90 (BP Location: Left Arm, Patient Position: Sitting, Cuff Size: Normal)   Pulse 78   Temp (!) 97 F (36.1 C) (Temporal)   Ht 5' 8 (1.727 m)   Wt (!) 327 lb 12.8 oz (148.7 kg)   LMP 12/21/2016   SpO2 99%   BMI 49.84 kg/m  BP Readings from Last 3 Encounters:  09/08/23 (!) 140/90  08/18/23 130/80  09/14/22 126/84   Wt Readings from Last 3 Encounters:  09/08/23 (!) 327 lb 12.8 oz (148.7 kg)  08/18/23 (!) 329 lb 12.8 oz (149.6 kg)   09/14/22 (!) 333 lb (151 kg)      Physical Exam Vitals and nursing note reviewed.  Constitutional:      General: She is not in acute distress.    Appearance: Normal appearance.  HENT:     Head: Normocephalic.  Eyes:     Conjunctiva/sclera: Conjunctivae normal.  Cardiovascular:     Rate and Rhythm: Normal rate and regular rhythm.     Pulses: Normal pulses.     Heart sounds: Normal heart sounds.  Pulmonary:     Effort: Pulmonary effort is normal.     Breath sounds: Normal breath sounds.  Musculoskeletal:        General: Swelling (slight to left knee) present.     Cervical back: Normal range of motion.  Skin:    General: Skin is warm.     Findings: Bruising (left knee) present.  Neurological:     General: No focal deficit present.     Mental Status: She is alert and oriented to person, place, and time.  Psychiatric:        Mood and Affect: Mood normal.        Behavior: Behavior normal.        Thought Content: Thought content normal.        Judgment: Judgment normal.    The  10-year ASCVD risk score (Arnett DK, et al., 2019) is: 2.2%    Assessment & Plan:   Problem List Items Addressed This Visit       Cardiovascular and Mediastinum   Essential hypertension   Her blood pressure is elevated at 142/98 mmHg, but she is asymptomatic. Regular home monitoring of blood pressure is advised, and blood pressure management will be reassessed at the next visit. Continue Lasix  20 mg daily, losartan -HCTZ 100-25 mg daily, and verapamil  to 40 mg daily.        Hematopoietic and Hemostatic   Antiphospholipid antibody syndrome (HCC) - Primary   Chronic ongoing. She does have history of pulmonary embolism and DVT. She is currently taking coumadin  4mg  daily and lovenox  150mg  every 12 hours until her INR is therapeutic again post surgery.       Relevant Orders   Protime-INR     Other   Morbid obesity (HCC)   Her weight increased after pausing Wegovy , but it remains lower than at  the last appointment. She plans to resume Wegovy , starting with the remaining 0.25 mg doses and then increasing to 0.5 mg. Continuation of healthy eating habits and weight monitoring is encouraged.       Long term current use of anticoagulants with INR goal of 2.0-3.0   She is on Lovenox  post-surgery, with continuation dependent on INR results. An INR test will be ordered to assess the need for continued Lovenox . Continue coumadin  4mg  daily.       Relevant Orders   Protime-INR   Chronic pain of left knee   She is recovering well post-knee surgery with no significant complications. A follow-up with the surgeon is scheduled in one week for postoperative evaluation.       Return if symptoms worsen or fail to improve, for at next appointment next month.    Odette Benjamin, NP

## 2023-09-08 NOTE — Assessment & Plan Note (Signed)
 She is on Lovenox  post-surgery, with continuation dependent on INR results. An INR test will be ordered to assess the need for continued Lovenox . Continue coumadin  4mg  daily.

## 2023-09-08 NOTE — Assessment & Plan Note (Signed)
 Her blood pressure is elevated at 142/98 mmHg, but she is asymptomatic. Regular home monitoring of blood pressure is advised, and blood pressure management will be reassessed at the next visit. Continue Lasix  20 mg daily, losartan -HCTZ 100-25 mg daily, and verapamil  to 40 mg daily.

## 2023-09-08 NOTE — Patient Instructions (Addendum)
 It was great to see you!  We are checking your labs today and will let you know the results via mychart/phone.   I will let you know if we need to continue the lovenox    Check your blood pressure at home.   Let's follow-up at your next visit, sooner if you have concerns.  If a referral was placed today, you will be contacted for an appointment. Please note that routine referrals can sometimes take up to 3-4 weeks to process. Please call our office if you haven't heard anything after this time frame.  Take care,  Rheba Cedar, NP

## 2023-09-08 NOTE — Assessment & Plan Note (Signed)
 Chronic ongoing. She does have history of pulmonary embolism and DVT. She is currently taking coumadin  4mg  daily and lovenox  150mg  every 12 hours until her INR is therapeutic again post surgery.

## 2023-09-08 NOTE — Assessment & Plan Note (Signed)
 She is recovering well post-knee surgery with no significant complications. A follow-up with the surgeon is scheduled in one week for postoperative evaluation.

## 2023-09-08 NOTE — Assessment & Plan Note (Signed)
 Her weight increased after pausing Wegovy , but it remains lower than at the last appointment. She plans to resume Wegovy , starting with the remaining 0.25 mg doses and then increasing to 0.5 mg. Continuation of healthy eating habits and weight monitoring is encouraged.

## 2023-09-13 ENCOUNTER — Ambulatory Visit: Payer: Self-pay | Admitting: Nurse Practitioner

## 2023-09-13 ENCOUNTER — Other Ambulatory Visit (INDEPENDENT_AMBULATORY_CARE_PROVIDER_SITE_OTHER)

## 2023-09-13 ENCOUNTER — Other Ambulatory Visit: Payer: Self-pay | Admitting: Nurse Practitioner

## 2023-09-13 DIAGNOSIS — D6861 Antiphospholipid syndrome: Secondary | ICD-10-CM

## 2023-09-13 DIAGNOSIS — Z7901 Long term (current) use of anticoagulants: Secondary | ICD-10-CM

## 2023-09-13 LAB — PROTIME-INR
INR: 2 ratio — ABNORMAL HIGH (ref 0.8–1.0)
Prothrombin Time: 20.5 s — ABNORMAL HIGH (ref 9.6–13.1)

## 2023-09-14 NOTE — Telephone Encounter (Signed)
 Requesting: WARFARIN SOD 4MG  TABLETS  Last Visit: 09/08/2023 Next Visit: 10/20/2023 Last Refill: 08/16/2023  Please Advise

## 2023-09-15 ENCOUNTER — Telehealth: Payer: Self-pay

## 2023-09-15 ENCOUNTER — Other Ambulatory Visit (HOSPITAL_COMMUNITY): Payer: Self-pay

## 2023-09-15 NOTE — Telephone Encounter (Signed)
 Hey [place],      The pt pays cash for her RX. Please see the note below from previously. I have also spoke with the Pts pharmacy and the Rx has been picked up as of today at a cost of 499 for 0.5mg  dosage.

## 2023-09-15 NOTE — Telephone Encounter (Signed)
 Prior Lauren Burns is needed for Wegovy  0.5ml

## 2023-09-15 NOTE — Telephone Encounter (Signed)
 Noted

## 2023-09-17 DIAGNOSIS — H1013 Acute atopic conjunctivitis, bilateral: Secondary | ICD-10-CM | POA: Diagnosis not present

## 2023-09-17 DIAGNOSIS — H40033 Anatomical narrow angle, bilateral: Secondary | ICD-10-CM | POA: Diagnosis not present

## 2023-09-27 ENCOUNTER — Other Ambulatory Visit (INDEPENDENT_AMBULATORY_CARE_PROVIDER_SITE_OTHER)

## 2023-09-27 ENCOUNTER — Ambulatory Visit: Payer: Self-pay | Admitting: Nurse Practitioner

## 2023-09-27 DIAGNOSIS — D6861 Antiphospholipid syndrome: Secondary | ICD-10-CM

## 2023-09-27 DIAGNOSIS — Z7901 Long term (current) use of anticoagulants: Secondary | ICD-10-CM

## 2023-09-27 LAB — PROTIME-INR
INR: 1.8 ratio — ABNORMAL HIGH (ref 0.8–1.0)
Prothrombin Time: 18.9 s — ABNORMAL HIGH (ref 9.6–13.1)

## 2023-09-27 NOTE — Addendum Note (Signed)
 Addended by: ALTO PARODY D on: 09/27/2023 11:24 AM   Modules accepted: Orders

## 2023-09-27 NOTE — Addendum Note (Signed)
 Addended by: ALTO PARODY D on: 09/27/2023 11:22 AM   Modules accepted: Orders

## 2023-10-07 ENCOUNTER — Encounter: Payer: Self-pay | Admitting: Nurse Practitioner

## 2023-10-07 ENCOUNTER — Other Ambulatory Visit: Payer: Self-pay | Admitting: Nurse Practitioner

## 2023-10-07 MED ORDER — WEGOVY 1 MG/0.5ML ~~LOC~~ SOAJ: 1.0000 mg | SUBCUTANEOUS | 0 refills | Status: DC

## 2023-10-07 NOTE — Telephone Encounter (Signed)
 Requesting: Wegovy   Last Visit: 09/08/2023 Next Visit: 10/20/2023 Last Refill: 08/18/2023  Please Advise

## 2023-10-10 ENCOUNTER — Other Ambulatory Visit: Payer: Self-pay | Admitting: Nurse Practitioner

## 2023-10-11 ENCOUNTER — Other Ambulatory Visit

## 2023-10-12 ENCOUNTER — Telehealth: Payer: Self-pay

## 2023-10-12 NOTE — Telephone Encounter (Signed)
 Received a prior auth of Wegovy  1mg .  Patient Lauren Burns out of pocket and cost is $499.00. Patient is aware.

## 2023-10-14 ENCOUNTER — Other Ambulatory Visit: Payer: Self-pay | Admitting: Nurse Practitioner

## 2023-10-14 DIAGNOSIS — I10 Essential (primary) hypertension: Secondary | ICD-10-CM

## 2023-10-14 NOTE — Telephone Encounter (Signed)
 Requesting: VERAPAMIL  ER 240MG  TABLETS  Last Visit: 09/08/2023 Next Visit: 10/20/2023 Last Refill: 09/10/2022  Please Advise

## 2023-10-18 ENCOUNTER — Other Ambulatory Visit: Payer: Self-pay | Admitting: Nurse Practitioner

## 2023-10-18 DIAGNOSIS — G8929 Other chronic pain: Secondary | ICD-10-CM

## 2023-10-20 ENCOUNTER — Ambulatory Visit: Admitting: Nurse Practitioner

## 2023-10-20 ENCOUNTER — Encounter: Payer: Self-pay | Admitting: Nurse Practitioner

## 2023-10-20 ENCOUNTER — Ambulatory Visit: Payer: Self-pay | Admitting: Nurse Practitioner

## 2023-10-20 VITALS — BP 118/84 | HR 64 | Temp 96.9°F | Ht 68.0 in | Wt 315.2 lb

## 2023-10-20 DIAGNOSIS — D6861 Antiphospholipid syndrome: Secondary | ICD-10-CM

## 2023-10-20 DIAGNOSIS — M25562 Pain in left knee: Secondary | ICD-10-CM

## 2023-10-20 DIAGNOSIS — G8929 Other chronic pain: Secondary | ICD-10-CM

## 2023-10-20 DIAGNOSIS — Z4789 Encounter for other orthopedic aftercare: Secondary | ICD-10-CM | POA: Diagnosis not present

## 2023-10-20 DIAGNOSIS — Z7901 Long term (current) use of anticoagulants: Secondary | ICD-10-CM

## 2023-10-20 LAB — PROTIME-INR
INR: 2.3 ratio — ABNORMAL HIGH (ref 0.8–1.0)
Prothrombin Time: 23.7 s — ABNORMAL HIGH (ref 9.6–13.1)

## 2023-10-20 NOTE — Patient Instructions (Signed)
It was great to see you! ? ?We are checking your labs today and will let you know the results via mychart/phone.  ? ?Let's follow-up in 2 months, sooner if you have concerns. ? ?If a referral was placed today, you will be contacted for an appointment. Please note that routine referrals can sometimes take up to 3-4 weeks to process. Please call our office if you haven't heard anything after this time frame. ? ?Take care, ? ?Vance Peper, NP ? ?

## 2023-10-20 NOTE — Progress Notes (Unsigned)
 Established Patient Office Visit  Subjective   Patient ID: Lauren Burns, female    DOB: Mar 14, 1976  Age: 48 y.o. MRN: [id number]  Chief Complaint  Patient presents with   Weight Managment and INR    Follow up and check INR    HPI Discussed the use of AI scribe software for clinical note transcription with the patient, who gave verbal consent to proceed.  History of Present Illness   Lauren Burns is a 48 year old female who presents with elevated blood pressure and concerns following recent surgery.  She has lost weight, decreasing from 327 pounds to 315 pounds over the past five weeks. She attributes this to a focus on protein intake, consuming 110 to 120 grams daily, and maintaining a caloric intake of 1400 to 1500 calories per day along with Wegovy  1mg  injection weekly. She uses a fitness tracking app to monitor her diet and hydration, drinking substantial amounts of water daily.  Five weeks post-surgery, she experienced a fall after tripping over her dog. She underwent x-rays this morning. She has been icing the affected area regularly, and the pain is manageable. She has not required a brace and is able to walk without difficulty.  She is taking 4 mg of Coumadin , with a previous INR of 1.8. She denies any bleeding.        ROS See pertinent positives and negatives per HPI.    Objective:     BP 118/84 (BP Location: Left Arm, Cuff Size: Large)   Pulse 64   Temp (!) 96.9 F (36.1 C)   Ht 5' 8 (1.727 m)   Wt (!) 315 lb 3.2 oz (143 kg)   LMP 12/21/2016   SpO2 98%   BMI 47.93 kg/m  BP Readings from Last 3 Encounters:  10/20/23 118/84  09/08/23 (!) 140/90  08/18/23 130/80   Wt Readings from Last 3 Encounters:  10/20/23 (!) 315 lb 3.2 oz (143 kg)  09/08/23 (!) 327 lb 12.8 oz (148.7 kg)  08/18/23 (!) 329 lb 12.8 oz (149.6 kg)      Physical Exam Vitals and nursing note reviewed.  Constitutional:      General: She is not in acute distress.     Appearance: Normal appearance.  HENT:     Head: Normocephalic.  Eyes:     Conjunctiva/sclera: Conjunctivae normal.  Cardiovascular:     Rate and Rhythm: Normal rate and regular rhythm.     Pulses: Normal pulses.     Heart sounds: Normal heart sounds.  Pulmonary:     Effort: Pulmonary effort is normal.     Breath sounds: Normal breath sounds.  Musculoskeletal:     Cervical back: Normal range of motion.  Skin:    General: Skin is warm.  Neurological:     General: No focal deficit present.     Mental Status: She is alert and oriented to person, place, and time.  Psychiatric:        Mood and Affect: Mood normal.        Behavior: Behavior normal.        Thought Content: Thought content normal.        Judgment: Judgment normal.        Assessment & Plan:   Problem List Items Addressed This Visit       Hematopoietic and Hemostatic   Antiphospholipid antibody syndrome (HCC) - Primary   Chronic ongoing. She does have history of pulmonary embolism and DVT. She is currently taking  coumadin  4mg  daily. Last INR was 1.8. Check INR today and adjust regimen based on results.       Relevant Orders   Protime-INR (Completed)     Other   Morbid obesity (HCC)   BMI 47.9. She has lost 12 pounds over five weeks, focusing on high protein intake and calorie tracking. Currently on Wegovy  with plans to increase to 1.7 mg. She is satisfied with the current rate of weight loss. Continue the current dietary regimen focusing on protein intake and calorie tracking. Increase Wegovy  to 1.7 mg as a maintenance dose.       Relevant Medications   Semaglutide -Weight Management (WEGOVY ) 1.7 MG/0.75ML SOAJ   Long term current use of anticoagulants with INR goal of 2.0-3.0   INR is below the target range at 1.8. Check INR levels today and adjust Coumadin  dosage if necessary. Continue coumadin  4mg  daily.       Relevant Orders   Protime-INR (Completed)   Chronic pain of left knee   Five weeks  postoperative with a recent fall, but X-rays show no damage. Mild pain is managed with icing. She is ambulatory. Continue icing the affected area as needed.       Return in about 2 months (around 12/21/2023) for CPE with pap.    Tinnie DELENA Harada, NP

## 2023-10-21 MED ORDER — WEGOVY 1.7 MG/0.75ML ~~LOC~~ SOAJ: 1.7000 mg | SUBCUTANEOUS | 1 refills | Status: DC

## 2023-10-21 NOTE — Assessment & Plan Note (Signed)
 BMI 47.9. She has lost 12 pounds over five weeks, focusing on high protein intake and calorie tracking. Currently on Wegovy  with plans to increase to 1.7 mg. She is satisfied with the current rate of weight loss. Continue the current dietary regimen focusing on protein intake and calorie tracking. Increase Wegovy  to 1.7 mg as a maintenance dose.

## 2023-10-21 NOTE — Assessment & Plan Note (Signed)
 Five weeks postoperative with a recent fall, but X-rays show no damage. Mild pain is managed with icing. She is ambulatory. Continue icing the affected area as needed.

## 2023-10-21 NOTE — Telephone Encounter (Signed)
 Requesting: GABAPENTIN  600MG  TABLETS  Last Visit: 09/08/2023 Next Visit: 10/20/2023 Last Refill: 07/15/2023  Please Advise

## 2023-10-21 NOTE — Assessment & Plan Note (Signed)
 INR is below the target range at 1.8. Check INR levels today and adjust Coumadin  dosage if necessary. Continue coumadin  4mg  daily.

## 2023-10-21 NOTE — Assessment & Plan Note (Signed)
 Chronic ongoing. She does have history of pulmonary embolism and DVT. She is currently taking coumadin  4mg  daily. Last INR was 1.8. Check INR today and adjust regimen based on results.

## 2023-10-26 ENCOUNTER — Encounter: Payer: Self-pay | Admitting: Nurse Practitioner

## 2023-10-26 MED ORDER — ONDANSETRON HCL 4 MG PO TABS: 4.0000 mg | ORAL_TABLET | Freq: Three times a day (TID) | ORAL | 0 refills | Status: DC | PRN

## 2023-10-27 MED ORDER — WEGOVY 0.5 MG/0.5ML ~~LOC~~ SOAJ
0.5000 mg | SUBCUTANEOUS | 1 refills | Status: DC
Start: 2023-10-27 — End: 2024-02-15

## 2023-10-27 NOTE — Addendum Note (Signed)
 Addended by: Lucciano Vitali A on: 10/27/2023 03:50 PM   Modules accepted: Orders

## 2023-11-01 ENCOUNTER — Telehealth: Payer: Self-pay

## 2023-11-01 ENCOUNTER — Other Ambulatory Visit (HOSPITAL_COMMUNITY): Payer: Self-pay

## 2023-11-01 NOTE — Telephone Encounter (Signed)
    Informed office of the following and  followed up with pharmacy  [facility],                 Just a reminder this pts pays Cash for Rx Wegovy . The Rx has been ready for pickup at the pharmacy since 7/31, with her regular cost of 499.00

## 2023-11-01 NOTE — Telephone Encounter (Signed)
 Can we get a prior auth of Wegovy  0.5mg /0.46ml?   Recipient ID is [id number]

## 2023-11-02 NOTE — Telephone Encounter (Signed)
 I called patient and left message that Rx ready for pick up with a cost of $499

## 2023-11-03 ENCOUNTER — Other Ambulatory Visit (HOSPITAL_COMMUNITY): Payer: Self-pay

## 2023-11-03 ENCOUNTER — Telehealth: Payer: Self-pay

## 2023-11-03 NOTE — Telephone Encounter (Signed)
 Pharmacy Patient Advocate Encounter   Received notification from Onbase that prior authorization for Wegovy  0.5 is required/requested.   Insurance verification completed.   The patient is insured through [facility] .   Per test claim: patient has plan benefit exclusion weight loss medications are not covered. This is the second request. See encounter 11/01/23 previous denial, patient pays out of pocket

## 2023-11-08 ENCOUNTER — Other Ambulatory Visit: Payer: Self-pay | Admitting: Nurse Practitioner

## 2023-11-08 NOTE — Telephone Encounter (Signed)
 Requesting: WARFARIN SOD 4MG  TABLETS  Last Visit: 10/20/2023 Next Visit: 12/22/2023 Last Refill: 10/11/2023  Please Advise

## 2023-11-10 ENCOUNTER — Other Ambulatory Visit: Payer: Self-pay | Admitting: Nurse Practitioner

## 2023-11-10 NOTE — Telephone Encounter (Signed)
 Requesting: VITAMIN D2 50,000IU (ERGO) CAP RX  Last Visit: 10/20/2023 Next Visit: 12/22/2023 Last Refill: 08/18/2023  Please Advise

## 2023-11-24 DIAGNOSIS — M23204 Derangement of unspecified medial meniscus due to old tear or injury, left knee: Secondary | ICD-10-CM | POA: Diagnosis not present

## 2023-12-06 DIAGNOSIS — Z1231 Encounter for screening mammogram for malignant neoplasm of breast: Secondary | ICD-10-CM | POA: Diagnosis not present

## 2023-12-06 LAB — HM MAMMOGRAPHY

## 2023-12-08 ENCOUNTER — Encounter: Payer: Self-pay | Admitting: Nurse Practitioner

## 2023-12-22 ENCOUNTER — Other Ambulatory Visit (HOSPITAL_COMMUNITY)
Admission: RE | Admit: 2023-12-22 | Discharge: 2023-12-22 | Disposition: A | Discharge status: Home / Self Care | Source: Ambulatory Visit | Attending: Nurse Practitioner | Admitting: Nurse Practitioner

## 2023-12-22 ENCOUNTER — Ambulatory Visit: Payer: Self-pay | Admitting: Nurse Practitioner

## 2023-12-22 ENCOUNTER — Encounter: Payer: Self-pay | Admitting: Nurse Practitioner

## 2023-12-22 ENCOUNTER — Ambulatory Visit: Admitting: Nurse Practitioner

## 2023-12-22 VITALS — BP 124/90 | HR 69 | Temp 96.5°F | Ht 68.0 in | Wt 300.8 lb

## 2023-12-22 DIAGNOSIS — G8929 Other chronic pain: Secondary | ICD-10-CM

## 2023-12-22 DIAGNOSIS — Z124 Encounter for screening for malignant neoplasm of cervix: Secondary | ICD-10-CM

## 2023-12-22 DIAGNOSIS — I10 Essential (primary) hypertension: Secondary | ICD-10-CM | POA: Diagnosis not present

## 2023-12-22 DIAGNOSIS — Z7901 Long term (current) use of anticoagulants: Secondary | ICD-10-CM

## 2023-12-22 DIAGNOSIS — F419 Anxiety disorder, unspecified: Secondary | ICD-10-CM | POA: Diagnosis not present

## 2023-12-22 DIAGNOSIS — D6861 Antiphospholipid syndrome: Secondary | ICD-10-CM | POA: Diagnosis not present

## 2023-12-22 DIAGNOSIS — R7303 Prediabetes: Secondary | ICD-10-CM | POA: Diagnosis not present

## 2023-12-22 DIAGNOSIS — Z1211 Encounter for screening for malignant neoplasm of colon: Secondary | ICD-10-CM

## 2023-12-22 DIAGNOSIS — F32A Depression, unspecified: Secondary | ICD-10-CM

## 2023-12-22 DIAGNOSIS — Z0000 Encounter for general adult medical examination without abnormal findings: Secondary | ICD-10-CM | POA: Diagnosis not present

## 2023-12-22 DIAGNOSIS — Z23 Encounter for immunization: Secondary | ICD-10-CM | POA: Diagnosis not present

## 2023-12-22 DIAGNOSIS — E559 Vitamin D deficiency, unspecified: Secondary | ICD-10-CM

## 2023-12-22 DIAGNOSIS — E782 Mixed hyperlipidemia: Secondary | ICD-10-CM | POA: Diagnosis not present

## 2023-12-22 LAB — COMPREHENSIVE METABOLIC PANEL WITH GFR
ALT: 13 U/L (ref 0–35)
AST: 20 U/L (ref 0–37)
Albumin: 4.4 g/dL (ref 3.5–5.2)
Alkaline Phosphatase: 82 U/L (ref 39–117)
BUN: 13 mg/dL (ref 6–23)
CO2: 30 meq/L (ref 19–32)
Calcium: 9.5 mg/dL (ref 8.4–10.5)
Chloride: 97 meq/L (ref 96–112)
Creatinine, Ser: 0.83 mg/dL (ref 0.40–1.20)
GFR: 82.95 mL/min (ref >60.00)
Glucose, Bld: 95 mg/dL (ref 70–99)
Potassium: 3.6 meq/L (ref 3.5–5.1)
Sodium: 135 meq/L (ref 135–145)
Total Bilirubin: 0.5 mg/dL (ref 0.2–1.2)
Total Protein: 7.5 g/dL (ref 6.0–8.3)

## 2023-12-22 LAB — CBC WITH DIFFERENTIAL/PLATELET
Basophils Absolute: 0 K/uL (ref 0.0–0.1)
Basophils Relative: 0.8 % (ref 0.0–3.0)
Eosinophils Absolute: 0.2 K/uL (ref 0.0–0.7)
Eosinophils Relative: 2.9 % (ref 0.0–5.0)
HCT: 41.6 % (ref 36.0–46.0)
Hemoglobin: 13.8 g/dL (ref 12.0–15.0)
Lymphocytes Relative: 29.6 % (ref 12.0–46.0)
Lymphs Abs: 1.7 K/uL (ref 0.7–4.0)
MCHC: 33.3 g/dL (ref 30.0–36.0)
MCV: 90.7 fl (ref 78.0–100.0)
Monocytes Absolute: 0.4 K/uL (ref 0.1–1.0)
Monocytes Relative: 6.8 % (ref 3.0–12.0)
Neutro Abs: 3.4 K/uL (ref 1.4–7.7)
Neutrophils Relative %: 59.9 % (ref 43.0–77.0)
Platelets: 299 K/uL (ref 150.0–400.0)
RBC: 4.58 Mil/uL (ref 3.87–5.11)
RDW: 13.4 % (ref 11.5–15.5)
WBC: 5.6 K/uL (ref 4.0–10.5)

## 2023-12-22 LAB — VITAMIN D 25 HYDROXY (VIT D DEFICIENCY, FRACTURES): VITD: 37.74 ng/mL (ref 30.00–100.00)

## 2023-12-22 LAB — LIPID PANEL
Cholesterol: 207 mg/dL — ABNORMAL HIGH (ref 0–200)
HDL: 49.6 mg/dL (ref >39.00)
LDL Cholesterol: 141 mg/dL — ABNORMAL HIGH (ref 0–99)
NonHDL: 157.54
Total CHOL/HDL Ratio: 4
Triglycerides: 81 mg/dL (ref 0.0–149.0)
VLDL: 16.2 mg/dL (ref 0.0–40.0)

## 2023-12-22 LAB — HEMOGLOBIN A1C: Hgb A1c MFr Bld: 5.7 % (ref 4.6–6.5)

## 2023-12-22 MED ORDER — COVID-19 MRNA VAC-TRIS(PFIZER) 30 MCG/0.3ML IM SUSY: 0.3000 mL | PREFILLED_SYRINGE | Freq: Once | INTRAMUSCULAR | 0 refills | Status: AC

## 2023-12-22 NOTE — Assessment & Plan Note (Addendum)
 Check vitamin D  levels today and adjust regimen based on results. She is currently taking a weekly vitamin D  supplement 50,000 units.

## 2023-12-22 NOTE — Assessment & Plan Note (Signed)
 Check INR levels today and adjust Coumadin  dosage if necessary. Continue coumadin  4mg  daily.

## 2023-12-22 NOTE — Assessment & Plan Note (Signed)
Chronic, stable.  Check CMP, CBC, lipid panel today and treat based on results.

## 2023-12-22 NOTE — Progress Notes (Signed)
 BP (!) 124/90 (BP Location: Left Arm, Cuff Size: Large)   Pulse 69   Temp (!) 96.5 F (35.8 C)   Ht 5' 8 (1.727 m)   Wt (!) 300 lb 12.8 oz (136.4 kg)   LMP 12/21/2016   SpO2 99%   BMI 45.74 kg/m    Subjective:    Patient ID: Lauren Burns, female    DOB: 15-Aug-1975, 48 y.o.   MRN: [id number]  CC: Chief Complaint  Patient presents with   Annual Exam    With fasting labs, no concerns, Flu Vaccine, Covid Rx    HPI: Lauren Burns is Lauren 48 y.o. female presenting on 12/22/2023 for comprehensive medical examination. Current medical complaints include:none  She currently lives with: spouse Menopausal Symptoms: no  Depression and Anxiety Screen done today and results listed below:     12/22/2023    9:28 AM 08/18/2023   11:06 AM 09/14/2022    4:54 PM 04/13/2022    3:21 PM 12/15/2021   10:05 AM  Depression screen PHQ 2/9  Decreased Interest 1 1 0 3 1  Down, Depressed, Hopeless 0 2 1 2 1   PHQ - 2 Score 1 3 1 5 2   Altered sleeping 0 2 0 1 1  Tired, decreased energy 1 1 1 3 3   Change in appetite 0 1 0 1 2  Feeling bad or failure about yourself  0 0 0 0 0  Trouble concentrating 1 1 1 2 2   Moving slowly or fidgety/restless 0 0 0 1 0  Suicidal thoughts 0 0 0 0 0  PHQ-9 Score 3 8 3 13 10   Difficult doing work/chores Somewhat difficult Somewhat difficult Not difficult at all Somewhat difficult Somewhat difficult      12/22/2023    9:29 AM 08/18/2023   11:06 AM 09/14/2022    4:54 PM 04/13/2022    3:22 PM  GAD 7 : Generalized Anxiety Score  Nervous, Anxious, on Edge 1 1 1 3   Control/stop worrying 1 1 1 2   Worry too much - different things 1 1 1 2   Trouble relaxing 1 0 0 1  Restless 0 0 0 0  Easily annoyed or irritable 0 1 0 0  Afraid - awful might happen 0 0 0 3  Total GAD 7 Score 4 4 3 11   Anxiety Difficulty Somewhat difficult Somewhat difficult Not difficult at all Somewhat difficult    The patient has Lauren history of falls. I did complete Lauren risk assessment for falls. Lauren  plan of care for falls was documented.   Past Medical History:  Past Medical History:  Diagnosis Date   Antiphospholipid antibody syndrome    Anxiety    Arthritis    Depression    Endometriosis    Fatigue    Fibromyalgia    GERD (gastroesophageal reflux disease)    Heart murmur    Hypertension    IBS (irritable bowel syndrome)    Meralgia paresthetica of left side    Obesity    Pulmonary embolism (HCC) 2004   Shortness of breath    Sleep apnea    Vitamin D  deficiency     Surgical History:  Past Surgical History:  Procedure Laterality Date   laprascopic     diagnosed endometriosis    Medications:  Current Outpatient Medications on File Prior to Visit  Medication Sig   buPROPion  (WELLBUTRIN  XL) 150 MG 24 hr tablet TAKE 1 TABLET(150 MG) BY MOUTH DAILY   busPIRone  (BUSPAR ) 5  MG tablet Take 1 tablet (5 mg total) by mouth 2 (two) times daily.   DIGESTIVE ENZYMES PO Take by mouth daily.   DULoxetine  (CYMBALTA ) 60 MG capsule TAKE 1 CAPSULE BY MOUTH TWICE DAILY   enoxaparin  (LOVENOX ) 150 MG/ML injection Inject 1 mL (150 mg total) into the skin every 12 (twelve) hours.   furosemide  (LASIX ) 20 MG tablet TAKE 1 TABLET(20 MG) BY MOUTH DAILY   gabapentin  (NEURONTIN ) 600 MG tablet TAKE 1 TABLET(600 MG) BY MOUTH THREE TIMES DAILY   losartan -hydrochlorothiazide (HYZAAR) 100-25 MG tablet TAKE 1 TABLET BY MOUTH DAILY   ondansetron  (ZOFRAN ) 4 MG tablet Take 1 tablet (4 mg total) by mouth every 8 (eight) hours as needed.   Semaglutide -Weight Management (WEGOVY ) 0.5 MG/0.5ML SOAJ Inject 0.5 mg into the skin once Lauren week.   verapamil  (CALAN -SR) 240 MG CR tablet TAKE 1 TABLET(240 MG) BY MOUTH AT BEDTIME   Vitamin D , Ergocalciferol , (DRISDOL ) 1.25 MG (50000 UNIT) CAPS capsule TAKE 1 CAPSULE BY MOUTH EVERY 7 DAYS   warfarin (COUMADIN ) 4 MG tablet TAKE 1 TABLET(4 MG) BY MOUTH DAILY   No current facility-administered medications on file prior to visit.    Allergies:  No Known  Allergies  Social History:  Social History   Socioeconomic History   Marital status: Married    Spouse name: Selinda   Number of children: 1   Years of education: Not on file   Highest education level: Bachelor's degree (e.g., BA, AB, BS)  Occupational History   Occupation: Research scientist (life sciences)  Tobacco Use   Smoking status: Former    Current packs/day: 0.00    Types: Cigarettes    Quit date: 12/17/2008    Years since quitting: 15.0   Smokeless tobacco: Never  Vaping Use   Vaping status: Never Used  Substance and Sexual Activity   Alcohol use: Yes    Alcohol/week: 1.0 standard drink of alcohol    Types: 1 Glasses of wine per week   Drug use: No   Sexual activity: Not on file  Other Topics Concern   Not on file  Social History Narrative   Lives with husband, son   Social Drivers of Corporate investment banker Strain: Not on file  Food Insecurity: Not on file  Transportation Needs: Not on file  Physical Activity: Not on file  Stress: Not on file  Social Connections: Not on file  Intimate Partner Violence: Not on file   Social History   Tobacco Use  Smoking Status Former   Current packs/day: 0.00   Types: Cigarettes   Quit date: 12/17/2008   Years since quitting: 15.0  Smokeless Tobacco Never   Social History   Substance and Sexual Activity  Alcohol Use Yes   Alcohol/week: 1.0 standard drink of alcohol   Types: 1 Glasses of wine per week    Family History:  Family History  Problem Relation Age of Onset   Hyperlipidemia Mother    Depression Mother    Anxiety disorder Mother    Obesity Mother    Heart disease Father    Alcohol abuse Father    Hypertension Father    Cancer Father    Alcoholism Father    Obesity Father    Diabetes Mellitus I Brother     Past medical history, surgical history, medications, allergies, family history and social history reviewed with patient today and changes made to appropriate areas of the chart.   Review of Systems   Constitutional: Negative.   HENT: Negative.  Eyes: Negative.   Respiratory: Negative.    Cardiovascular: Negative.   Gastrointestinal: Negative.   Genitourinary: Negative.   Musculoskeletal:  Positive for joint pain (left knee).  Skin: Negative.   Neurological: Negative.   Psychiatric/Behavioral: Negative.     All other ROS negative except what is listed above and in the HPI.      Objective:    BP (!) 124/90 (BP Location: Left Arm, Cuff Size: Large)   Pulse 69   Temp (!) 96.5 F (35.8 C)   Ht 5' 8 (1.727 m)   Wt (!) 300 lb 12.8 oz (136.4 kg)   LMP 12/21/2016   SpO2 99%   BMI 45.74 kg/m   Wt Readings from Last 3 Encounters:  12/22/23 (!) 300 lb 12.8 oz (136.4 kg)  10/20/23 (!) 315 lb 3.2 oz (143 kg)  09/08/23 (!) 327 lb 12.8 oz (148.7 kg)    Physical Exam Vitals and nursing note reviewed. Exam conducted with Lauren chaperone present.  Constitutional:      General: She is not in acute distress.    Appearance: Normal appearance.  HENT:     Head: Normocephalic and atraumatic.     Right Ear: Tympanic membrane, ear canal and external ear normal.     Left Ear: Tympanic membrane, ear canal and external ear normal.     Mouth/Throat:     Mouth: Mucous membranes are moist.     Pharynx: No posterior oropharyngeal erythema.  Eyes:     Conjunctiva/sclera: Conjunctivae normal.  Cardiovascular:     Rate and Rhythm: Normal rate and regular rhythm.     Pulses: Normal pulses.     Heart sounds: Normal heart sounds.  Pulmonary:     Effort: Pulmonary effort is normal.     Breath sounds: Normal breath sounds.  Abdominal:     Palpations: Abdomen is soft.     Tenderness: There is no abdominal tenderness.  Genitourinary:    General: Normal vulva.     Exam position: Lithotomy position.     Labia:        Right: No rash, tenderness or lesion.        Left: No rash, tenderness or lesion.      Vagina: Vaginal discharge present.     Cervix: Normal.     Uterus: Normal.      Adnexa:  Right adnexa normal and left adnexa normal.  Musculoskeletal:        General: Normal range of motion.     Cervical back: Normal range of motion and neck supple.     Right lower leg: No edema.     Left lower leg: No edema.  Lymphadenopathy:     Cervical: No cervical adenopathy.  Skin:    General: Skin is warm and dry.  Neurological:     General: No focal deficit present.     Mental Status: She is alert and oriented to person, place, and time.     Cranial Nerves: No cranial nerve deficit.     Coordination: Coordination normal.     Gait: Gait normal.  Psychiatric:        Mood and Affect: Mood normal.        Behavior: Behavior normal.        Thought Content: Thought content normal.        Judgment: Judgment normal.     Results for orders placed or performed in visit on 12/08/23  HM MAMMOGRAPHY   Collection Time: 12/06/23  7:57 AM  Result Value Ref Range   HM Mammogram 0-4 Bi-Rad 0-4 Bi-Rad, Self Reported Normal      Assessment & Plan:   Problem List Items Addressed This Visit       Cardiovascular and Mediastinum   Essential hypertension   Chronic, stable. Continue Lasix  20 mg daily, losartan -HCTZ 100-25 mg daily, and verapamil  to 40 mg daily. Check CMP, CBC today. Discussed possible sleep study with elevated diastolic, however she would like to hold off for now.       Relevant Orders   CBC with Differential/Platelet   Comprehensive metabolic panel with GFR     Hematopoietic and Hemostatic   Antiphospholipid antibody syndrome   Chronic ongoing. She does have history of pulmonary embolism and DVT. She is currently taking coumadin  4mg  daily. Check INR and adjust regimen based on results.       Relevant Orders   Protime-INR     Other   Mixed hyperlipidemia   Chronic, stable.  Check CMP, CBC, lipid panel today and treat based on results.      Relevant Orders   CBC with Differential/Platelet   Comprehensive metabolic panel with GFR   Lipid panel   Morbid obesity  (HCC)   BMI 45.7. She has lost another 15 pounds, focusing on high protein intake and calorie tracking. Currently on Wegovy  with plans to increase to 1.7 mg. She is satisfied with the current rate of weight loss. Continue the current dietary regimen focusing on protein intake and calorie tracking. Increase Wegovy  to 1.7 mg as Lauren maintenance dose.       Relevant Medications   COVID-19 mRNA vaccine, Pfizer, (COMIRNATY) syringe   Vitamin D  deficiency   Check vitamin D  levels today and adjust regimen based on results. She is currently taking Lauren weekly vitamin D  supplement 50,000 units.       Relevant Orders   VITAMIN D  25 Hydroxy (Vit-D Deficiency, Fractures)   Prediabetes   Last A1c was within normal limits, will check A1c today.      Relevant Orders   Hemoglobin A1c   Long term current use of anticoagulants with INR goal of 2.0-3.0   Check INR levels today and adjust Coumadin  dosage if necessary. Continue coumadin  4mg  daily.       Relevant Orders   Protime-INR   Anxiety and depression   Chronic, stable. Continue cymbalta  60mg  daily, wellbutrin  150mg  daily, and buspar  5mg  BID.       Relevant Medications   COVID-19 mRNA vaccine, Pfizer, (COMIRNATY) syringe   Chronic pain of left knee   She is following with ortho and has Lauren stress fracture in her left knee. Continue rest and vitamin D  supplement weekly.       Routine general medical examination at Lauren health care facility - Primary   Health maintenance reviewed and updated. Discussed nutrition, exercise. Follow-up 1 year.        Other Visit Diagnoses       Screen for colon cancer       Referral placed to GI.   Relevant Orders   Ambulatory referral to Gastroenterology     Immunization due       Flu vaccine given. Rx given for covid-19 vaccine.   Relevant Medications   COVID-19 mRNA vaccine, Pfizer, (COMIRNATY) syringe   Other Relevant Orders   Flu vaccine trivalent PF, 6mos and older(Flulaval,Afluria,Fluarix,Fluzone)      Screening for cervical cancer       pap done today   Relevant Orders  Cytology - PAP        Follow up plan: Return in about 3 months (around 03/22/2024) for INR, weight management .   LABORATORY TESTING:  - Pap smear: pap done  IMMUNIZATIONS:   - Tdap: Tetanus vaccination status reviewed: last tetanus booster within 10 years. - Influenza: Administered today - Pneumovax: Not applicable - Prevnar: Not applicable - HPV: Not applicable - Shingrix vaccine: Not applicable  SCREENING: -Mammogram: Up to date  - Colonoscopy: Ordered today  - Bone Density: Not applicable   PATIENT COUNSELING:   Advised to take 1 mg of folate supplement per day if capable of pregnancy.   Sexuality: Discussed sexually transmitted diseases, partner selection, use of condoms, avoidance of unintended pregnancy  and contraceptive alternatives.   Advised to avoid cigarette smoking.  I discussed with the patient that most people either abstain from alcohol or drink within safe limits (<=14/week and <=4 drinks/occasion for males, <=7/weeks and <= 3 drinks/occasion for females) and that the risk for alcohol disorders and other health effects rises proportionally with the number of drinks per week and how often Lauren drinker exceeds daily limits.  Discussed cessation/primary prevention of drug use and availability of treatment for abuse.   Diet: Encouraged to adjust caloric intake to maintain  or achieve ideal body weight, to reduce intake of dietary saturated fat and total fat, to limit sodium intake by avoiding high sodium foods and not adding table salt, and to maintain adequate dietary potassium and calcium preferably from fresh fruits, vegetables, and low-fat dairy products.    stressed the importance of regular exercise  Injury prevention: Discussed safety belts, safety helmets, smoke detector, smoking near bedding or upholstery.   Dental health: Discussed importance of regular tooth brushing, flossing,  and dental visits.    NEXT PREVENTATIVE PHYSICAL DUE IN 1 YEAR. Return in about 3 months (around 03/22/2024) for INR, weight management .  Lauren Burns Lauren Burns

## 2023-12-22 NOTE — Assessment & Plan Note (Signed)
 BMI 45.7. She has lost another 15 pounds, focusing on high protein intake and calorie tracking. Currently on Wegovy  with plans to increase to 1.7 mg. She is satisfied with the current rate of weight loss. Continue the current dietary regimen focusing on protein intake and calorie tracking. Increase Wegovy  to 1.7 mg as a maintenance dose.

## 2023-12-22 NOTE — Assessment & Plan Note (Signed)
Last A1c was within normal limits, will check A1c today.

## 2023-12-22 NOTE — Assessment & Plan Note (Signed)
 Chronic, stable. Continue Lasix  20 mg daily, losartan -HCTZ 100-25 mg daily, and verapamil  to 40 mg daily. Check CMP, CBC today. Discussed possible sleep study with elevated diastolic, however she would like to hold off for now.

## 2023-12-22 NOTE — Assessment & Plan Note (Signed)
 She is following with ortho and has a stress fracture in her left knee. Continue rest and vitamin D  supplement weekly.

## 2023-12-22 NOTE — Assessment & Plan Note (Signed)
 Health maintenance reviewed and updated. Discussed nutrition, exercise. Follow-up 1 year.

## 2023-12-22 NOTE — Patient Instructions (Signed)
It was great to see you!  We are checking your labs today and will let you know the results via mychart/phone.   Keep up the great work!   Let's follow-up in 3 months, sooner if you have concerns.  If a referral was placed today, you will be contacted for an appointment. Please note that routine referrals can sometimes take up to 3-4 weeks to process. Please call our office if you haven't heard anything after this time frame.  Take care,  Rodman Pickle, NP

## 2023-12-22 NOTE — Assessment & Plan Note (Signed)
 Chronic, stable. Continue cymbalta  60mg  daily, wellbutrin  150mg  daily, and buspar  5mg  BID.

## 2023-12-22 NOTE — Assessment & Plan Note (Signed)
 Chronic ongoing. She does have history of pulmonary embolism and DVT. She is currently taking coumadin  4mg  daily. Check INR and adjust regimen based on results.

## 2023-12-23 LAB — CYTOLOGY - PAP
Comment: NEGATIVE
Diagnosis: NEGATIVE
High risk HPV: NEGATIVE

## 2023-12-23 LAB — PROTIME-INR
INR: 2.1 — ABNORMAL HIGH
Prothrombin Time: 21.3 s — ABNORMAL HIGH (ref 9.0–11.5)

## 2023-12-29 ENCOUNTER — Other Ambulatory Visit: Payer: Self-pay | Admitting: Nurse Practitioner

## 2023-12-29 DIAGNOSIS — I10 Essential (primary) hypertension: Secondary | ICD-10-CM

## 2023-12-29 DIAGNOSIS — R609 Edema, unspecified: Secondary | ICD-10-CM

## 2023-12-30 NOTE — Telephone Encounter (Signed)
 Requesting: FUROSEMIDE  20MG  TABLETS  Last Visit: 12/22/2023 Next Visit: 04/03/2024 Last Refill: 12/07/2022  Please Advise

## 2024-01-11 ENCOUNTER — Other Ambulatory Visit: Payer: Self-pay | Admitting: Nurse Practitioner

## 2024-01-11 DIAGNOSIS — G8929 Other chronic pain: Secondary | ICD-10-CM

## 2024-01-11 DIAGNOSIS — I10 Essential (primary) hypertension: Secondary | ICD-10-CM

## 2024-01-11 NOTE — Telephone Encounter (Signed)
 Requesting: LOSARTAN /HCTZ 100/25MG  TABLETS , GABAPENTIN  600MG  TABLETS  Last Visit: 12/22/2023 Next Visit: 04/03/2024 Last Refill: 07/15/2023, 7/24/20025  Please Advise

## 2024-01-13 ENCOUNTER — Other Ambulatory Visit: Payer: Self-pay | Admitting: Nurse Practitioner

## 2024-01-14 NOTE — Telephone Encounter (Signed)
 Requesting: DULOXETINE  DR 60MG  CAPSULES  Last Visit: 12/22/2023 Next Visit: 04/03/2024 Last Refill: 07/15/2023  Please Advise

## 2024-02-02 ENCOUNTER — Other Ambulatory Visit: Payer: Self-pay | Admitting: Nurse Practitioner

## 2024-02-02 NOTE — Telephone Encounter (Signed)
 Requesting:  BUSPIRONE  5MG  TABLETS ,  VITAMIN D2 50,000IU (ERGO) CAP RX  Last Visit: 12/22/2023 Next Visit: 04/03/2024 Last Refill: 08/18/2023, 11/10/2023  Please Advise

## 2024-02-08 ENCOUNTER — Other Ambulatory Visit: Payer: Self-pay | Admitting: Nurse Practitioner

## 2024-02-09 NOTE — Telephone Encounter (Signed)
 Requesting: ONDANSETRON  4MG  TABLETS  Last Visit: 12/22/2023 Next Visit: 04/03/2024 Last Refill: 10/26/2023  Please Advise

## 2024-02-14 ENCOUNTER — Other Ambulatory Visit: Payer: Self-pay | Admitting: Nurse Practitioner

## 2024-02-15 NOTE — Telephone Encounter (Signed)
 Requesting: WEGOVY  1.7MG /0.75ML INJ ( 4 PENS)  Last Visit: 12/22/2023 Next Visit: 04/03/2024 Last Refill: The original prescription was discontinued on 10/27/2023 by Nedra Tinnie LABOR, NP. Renewing this prescription may not be appropriate   Please Advise

## 2024-02-29 ENCOUNTER — Other Ambulatory Visit: Payer: Self-pay | Admitting: Nurse Practitioner

## 2024-03-02 NOTE — Telephone Encounter (Signed)
 Requesting: BUSPIRONE  5MG  TABLETS  Last Visit: 12/22/2023 Next Visit: 04/03/2024 Last Refill: 02/02/2024  Please Advise

## 2024-03-08 ENCOUNTER — Encounter: Payer: Self-pay | Admitting: Nurse Practitioner

## 2024-04-03 ENCOUNTER — Ambulatory Visit: Admitting: Nurse Practitioner

## 2024-04-03 ENCOUNTER — Ambulatory Visit: Payer: Self-pay | Admitting: Nurse Practitioner

## 2024-04-03 ENCOUNTER — Encounter: Payer: Self-pay | Admitting: Nurse Practitioner

## 2024-04-03 VITALS — BP 122/80 | HR 66 | Temp 97.9°F | Ht 68.0 in | Wt 269.6 lb

## 2024-04-03 DIAGNOSIS — Z7901 Long term (current) use of anticoagulants: Secondary | ICD-10-CM | POA: Diagnosis not present

## 2024-04-03 DIAGNOSIS — M25562 Pain in left knee: Secondary | ICD-10-CM | POA: Diagnosis not present

## 2024-04-03 DIAGNOSIS — E559 Vitamin D deficiency, unspecified: Secondary | ICD-10-CM

## 2024-04-03 DIAGNOSIS — D6861 Antiphospholipid syndrome: Secondary | ICD-10-CM

## 2024-04-03 DIAGNOSIS — G8929 Other chronic pain: Secondary | ICD-10-CM

## 2024-04-03 LAB — VITAMIN D 25 HYDROXY (VIT D DEFICIENCY, FRACTURES): VITD: 37.82 ng/mL (ref 30.00–100.00)

## 2024-04-03 LAB — PROTIME-INR
INR: 2.3 ratio — ABNORMAL HIGH (ref 0.8–1.0)
Prothrombin Time: 23 s — ABNORMAL HIGH (ref 9.6–13.1)

## 2024-04-03 NOTE — Assessment & Plan Note (Signed)
 BMI 40.9. Significant weight loss has been achieved with the current regimen. Intermittent queasiness is managed with ginger ale and Zofran . She engages in chair exercises due to knee limitations. Continue the current weight loss regimen with wegovy  1.7 mg weekly and  eating 1600-1800 calories per day. Encourage continuation of chair dumbbell exercises as tolerated. Follow-up in 2 months.

## 2024-04-03 NOTE — Progress Notes (Signed)
 "  Established Patient Office Visit  Subjective   Patient ID: Lauren Burns, female    DOB: July 02, 1975  Age: 49 y.o. MRN: [id number]  Chief Complaint  Patient presents with   Weight Management    Follow up, no concerns     HPI  Discussed the use of AI scribe software for clinical note transcription with the patient, who gave verbal consent to proceed.  History of Present Illness   Lauren Burns is a 49 year old female who presents for follow-up on weight management and knee pain.  She has lost about 30 pounds since her last visit and over 60 pounds total since starting her weight loss efforts, with changes including smaller portions, higher protein intake, and avoiding sugar. She uses a weekly wegovy  and has occasional queasiness on day 2 or 3 after the shot, relieved with ginger ale or Zofran  about once a week. She targets 1600 to 1800 calories daily.  She has a known stress fracture in the left knee and ongoing pain, especially after being on her feet at work. A cortisone shot in August 2025 gave temporary relief. She wears a brace at work. She is doing chair-based dumbbell exercises.  She takes Coumadin  and is due for INR testing. She has not noticed any bleeding.       ROS See pertinent positives and negatives per HPI.    Objective:     BP 122/80 (BP Location: Left Arm, Patient Position: Sitting, Cuff Size: Large)   Pulse 66   Temp 97.9 F (36.6 C) (Oral)   Ht 5' 8 (1.727 m)   Wt 269 lb 9.6 oz (122.3 kg)   LMP 12/21/2016   SpO2 100%   BMI 40.99 kg/m  BP Readings from Last 3 Encounters:  04/03/24 122/80  12/22/23 (!) 124/90  10/20/23 118/84   Wt Readings from Last 3 Encounters:  04/03/24 269 lb 9.6 oz (122.3 kg)  12/22/23 (!) 300 lb 12.8 oz (136.4 kg)  10/20/23 (!) 315 lb 3.2 oz (143 kg)      Physical Exam Vitals and nursing note reviewed.  Constitutional:      General: She is not in acute distress.    Appearance: Normal appearance.  HENT:      Head: Normocephalic.  Eyes:     Conjunctiva/sclera: Conjunctivae normal.  Cardiovascular:     Rate and Rhythm: Normal rate and regular rhythm.     Pulses: Normal pulses.     Heart sounds: Normal heart sounds.  Pulmonary:     Effort: Pulmonary effort is normal.     Breath sounds: Normal breath sounds.  Musculoskeletal:     Cervical back: Normal range of motion.  Skin:    General: Skin is warm.  Neurological:     General: No focal deficit present.     Mental Status: She is alert and oriented to person, place, and time.  Psychiatric:        Mood and Affect: Mood normal.        Behavior: Behavior normal.        Thought Content: Thought content normal.        Judgment: Judgment normal.     The 10-year ASCVD risk score (Arnett DK, et al., 2019) is: 1.7%    Assessment & Plan:   Problem List Items Addressed This Visit       Hematopoietic and Hemostatic   Antiphospholipid antibody syndrome - Primary   Chronic ongoing. She does have history of pulmonary embolism  and DVT. She is currently taking coumadin  4mg  daily. Check INR and adjust regimen based on results.       Relevant Orders   Protime-INR     Other   Morbid obesity (HCC)   BMI 40.9. Significant weight loss has been achieved with the current regimen. Intermittent queasiness is managed with ginger ale and Zofran . She engages in chair exercises due to knee limitations. Continue the current weight loss regimen with wegovy  1.7 mg weekly and  eating 1600-1800 calories per day. Encourage continuation of chair dumbbell exercises as tolerated. Follow-up in 2 months.       Vitamin D  deficiency   Check vitamin D  levels today and adjust regimen based on results. She is currently taking OTC vitamin D .       Relevant Orders   VITAMIN D  25 Hydroxy (Vit-D Deficiency, Fractures)   Long term current use of anticoagulants with INR goal of 2.0-3.0   Check INR levels today and adjust Coumadin  dosage if necessary. Continue coumadin  4mg   daily.       Relevant Orders   Protime-INR   Chronic pain of left knee   Persistent pain remains post-surgery with a stress fracture contributing. She uses a brace and avoids weight-bearing exercises. She is going to get a steroid injection soon with ortho. Continue using the knee brace at work and avoid weight-bearing exercises until further evaluation. Continue vitamin D  supplementation.       Return in about 2 months (around 06/01/2024) for weight management, inr.    Tinnie DELENA Harada, NP  "

## 2024-04-03 NOTE — Assessment & Plan Note (Signed)
 Check INR levels today and adjust Coumadin  dosage if necessary. Continue coumadin  4mg  daily.

## 2024-04-03 NOTE — Assessment & Plan Note (Signed)
 Persistent pain remains post-surgery with a stress fracture contributing. She uses a brace and avoids weight-bearing exercises. She is going to get a steroid injection soon with ortho. Continue using the knee brace at work and avoid weight-bearing exercises until further evaluation. Continue vitamin D  supplementation.

## 2024-04-03 NOTE — Patient Instructions (Signed)
 It was great to see you!  Aim for 1,200 mg of calcium per day. If you get this with food, you don't need a supplement.   Keep up the great work!!!   Let's follow-up in 2 months, sooner if you have concerns.  If a referral was placed today, you will be contacted for an appointment. Please note that routine referrals can sometimes take up to 3-4 weeks to process. Please call our office if you haven't heard anything after this time frame.  Take care,  Tinnie Harada, NP

## 2024-04-03 NOTE — Assessment & Plan Note (Signed)
 Chronic ongoing. She does have history of pulmonary embolism and DVT. She is currently taking coumadin  4mg  daily. Check INR and adjust regimen based on results.

## 2024-04-03 NOTE — Assessment & Plan Note (Signed)
 Check vitamin D  levels today and adjust regimen based on results. She is currently taking OTC vitamin D .

## 2024-04-10 ENCOUNTER — Other Ambulatory Visit: Payer: Self-pay | Admitting: Nurse Practitioner

## 2024-04-10 NOTE — Telephone Encounter (Signed)
 Requesting: WEGOVY  1.7MG /0.75ML INJ ( 4 PENS)  Last Visit: 04/03/2024 Next Visit: 06/01/2024 Last Refill: 02/15/2024  Please Advise

## 2024-06-01 ENCOUNTER — Ambulatory Visit: Admitting: Nurse Practitioner
